# Patient Record
Sex: Female | Born: 1995 | Race: Black or African American | Hispanic: No | Marital: Single | Smoking: Never smoker
Health system: Southern US, Community
[De-identification: ages and names within clinical notes are randomized; demographics above are authoritative.]

## PROBLEM LIST (undated history)

## (undated) ENCOUNTER — Inpatient Hospital Stay (HOSPITAL_COMMUNITY): Payer: Self-pay

## (undated) ENCOUNTER — Ambulatory Visit (HOSPITAL_COMMUNITY): Admission: EM | Discharge status: Absent without leave | Payer: BLUE CROSS/BLUE SHIELD

## (undated) DIAGNOSIS — R519 Headache, unspecified: Secondary | ICD-10-CM

## (undated) DIAGNOSIS — D649 Anemia, unspecified: Secondary | ICD-10-CM

## (undated) DIAGNOSIS — R51 Headache: Secondary | ICD-10-CM

## (undated) HISTORY — PX: INDUCED ABORTION: SHX677

## (undated) HISTORY — PX: WISDOM TOOTH EXTRACTION: SHX21

---
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
## 2017-03-17 ENCOUNTER — Encounter (HOSPITAL_COMMUNITY): Payer: Self-pay | Admitting: *Deleted

## 2017-03-17 ENCOUNTER — Emergency Department (HOSPITAL_COMMUNITY)
Admission: EM | Admit: 2017-03-17 | Discharge: 2017-03-17 | Disposition: A | Discharge status: Home / Self Care | Payer: Medicaid Other | Attending: Emergency Medicine | Admitting: Emergency Medicine

## 2017-03-17 DIAGNOSIS — H9202 Otalgia, left ear: Secondary | ICD-10-CM | POA: Diagnosis present

## 2017-03-17 DIAGNOSIS — N926 Irregular menstruation, unspecified: Secondary | ICD-10-CM

## 2017-03-17 DIAGNOSIS — H9312 Tinnitus, left ear: Secondary | ICD-10-CM | POA: Insufficient documentation

## 2017-03-17 DIAGNOSIS — Z79899 Other long term (current) drug therapy: Secondary | ICD-10-CM | POA: Insufficient documentation

## 2017-03-17 DIAGNOSIS — Z3201 Encounter for pregnancy test, result positive: Secondary | ICD-10-CM

## 2017-03-17 LAB — URINALYSIS, ROUTINE W REFLEX MICROSCOPIC
BACTERIA UA: NONE SEEN
BILIRUBIN URINE: NEGATIVE
Glucose, UA: NEGATIVE mg/dL
HGB URINE DIPSTICK: NEGATIVE
KETONES UR: 20 mg/dL — AB
NITRITE: NEGATIVE
Protein, ur: 100 mg/dL — AB
SPECIFIC GRAVITY, URINE: 1.028 (ref 1.005–1.030)
pH: 6 (ref 5.0–8.0)

## 2017-03-17 LAB — POC URINE PREG, ED: Preg Test, Ur: POSITIVE — AB

## 2017-03-17 MED ORDER — PRENATAL FORTE PO TABS: 1.0000 | ORAL_TABLET | Freq: Once | ORAL | 0 refills | Status: AC

## 2017-03-17 NOTE — ED Notes (Signed)
MD at bedside. 

## 2017-03-17 NOTE — ED Provider Notes (Signed)
[facility]-EMERGENCY DEPT Provider Note   CSN: [id number] Arrival date & time: 03/17/17  1833  By signing my name below, I, Linna Darner, attest that this documentation has been prepared under the direction and in the presence of Sharen Heck, PA-C. Electronically Signed: Linna Darner, Scribe. 03/17/2017. 8:57 PM.  History   Chief Complaint Chief Complaint  Patient presents with  . Otalgia    The history is provided by the patient. No language interpreter was used.     HPI Comments: April Wallace is a 21 y.o. female who presents to the Emergency Department with two concerns.    Patient reports intermittent "deep" left ear pain for about one week associated with tinnitus that presents simultaneously with her ear pain. Pt states neither of her left ear symptoms are present currently. No alleviating factors noted. No recent swimming. No h/o trauma to her left ear/eardrum. Pt denies ear discharge, fevers, facial pain, sore throat, congestion, cough, dizziness, sensation of room spinning, hearing loss, or any other associated symptoms.    Patient reports abdominal pain described as cramping similar to menstrual cramps she has in the past.  Patient notes her menstrual cycle is now four days late. Pt's LMP was 3/1-3/7 and she states her next menstrual cycle was supposed to begin on 3/28. She states she has had one episode of vomiting since onset of her abdominal pain without nausea. Patient has previously been on hormone therapy birth control pills but discontinued them this past February. She had planned to start Depo shots but has not received one yet since discontinuing her oral contraceptives.  Patient notes she has taken two pregnancy tests at home, one was negative and the other was positive.  She is sexually active with one partner and always uses barrier protection reportedly 100% of the time, notes she has not had intercourse in one month. Pt denies vaginal bleeding or spotting,  vaginal discharge, dysuria, urinary frequency, back pain, or any other associated symptoms.  History reviewed. No pertinent past medical history.  There are no active problems to display for this patient.   History reviewed. No pertinent surgical history.  OB History    No data available       Home Medications    Prior to Admission medications   Medication Sig Start Date End Date Taking? Authorizing Provider  benzonatate (TESSALON) 100 MG capsule Take 1 capsule (100 mg total) by mouth every 8 (eight) hours. 10/30/16   Alexandra M Law, PA-C  cetirizine-pseudoephedrine (ZYRTEC-D) 5-120 MG tablet Take 1 tablet by mouth 2 (two) times daily. 10/30/16   Emi Holes, PA-C  guaiFENesin (ROBITUSSIN) 100 MG/5ML liquid Take 5-10 mLs (100-200 mg total) by mouth every 4 (four) hours as needed for congestion. 12/04/15   Fayrene Helper, PA-C  naproxen sodium (ANAPROX DS) 550 MG tablet Take 1 tablet (550 mg total) by mouth 2 (two) times daily with a meal. 05/15/16   Tharon Aquas, PA  promethazine-dextromethorphan (PROMETHAZINE-DM) 6.25-15 MG/5ML syrup Take 5 mLs by mouth 4 (four) times daily as needed for cough. 12/04/15   Fayrene Helper, PA-C    Family History No family history on file.  Social History Social History  Substance Use Topics  . Smoking status: Never Smoker  . Smokeless tobacco: Never Used  . Alcohol use No     Allergies   Patient has no known allergies.   Review of Systems Review of Systems  Constitutional: Negative for fever.  HENT: Positive for ear pain (L) and tinnitus (  L). Negative for congestion, ear discharge, hearing loss and sore throat.   Respiratory: Negative for cough.   Gastrointestinal: Positive for abdominal pain and vomiting. Negative for nausea.  Genitourinary: Negative for dysuria, frequency and vaginal bleeding.  Musculoskeletal: Negative for back pain.  Neurological: Negative for light-headedness.   Physical Exam Updated Vital Signs BP 107/79 (BP  Location: Right Arm)   Pulse 87   Temp 98.8 F (37.1 C) (Oral)   Resp 18   SpO2 100%   Physical Exam  Constitutional: She is oriented to person, place, and time. She appears well-developed and well-nourished. She is cooperative. No distress.  HENT:  Head: Normocephalic and atraumatic.  Right Ear: Tympanic membrane normal.  Left Ear: Tympanic membrane normal.  Nose: Nose normal.  Ears: External ears without lesions, swelling, deformities or tenderness.  No mastoid tenderness. R and L external ear auditory canals clear without edema or erythema. No pain reported with external ear manipulation.  TMs pearly gray with visible cone of light and bony landmarks bilaterally, no bulging or cloudiness.  Eyes: Conjunctivae and EOM are normal. No scleral icterus.  Cardiovascular: Normal rate, regular rhythm, normal heart sounds and intact distal pulses.   No murmur heard. Pulmonary/Chest: Effort normal and breath sounds normal. She has no wheezes.  Abdominal:  No surgical abdominal scars noted.  No pulsating masses.  + Bowel sounds throughout.  Abdomen is soft, non tender without distention, rigidity, guarding or rebound.  No suprapubic tenderness. No CVAT.  Negative Murphy's. Negative McBurney's. Negative Psoas sign.  Non palpable kidneys. No hepatosplenomegaly.   Musculoskeletal: Normal range of motion. She exhibits no deformity.  Neurological: She is alert and oriented to person, place, and time.  Skin: Skin is warm and dry. Capillary refill takes less than 2 seconds.  Psychiatric: She has a normal mood and affect. Her behavior is normal. Judgment and thought content normal.  Nursing note and vitals reviewed.  ED Treatments / Results  Labs (all labs ordered are listed, but only abnormal results are displayed) Labs Reviewed  URINALYSIS, ROUTINE W REFLEX MICROSCOPIC - Abnormal; Notable for the following:       Result Value   Ketones, ur 20 (*)    Protein, ur 100 (*)    Leukocytes, UA  TRACE (*)    Squamous Epithelial / LPF 0-5 (*)    All other components within normal limits  POC URINE PREG, ED - Abnormal; Notable for the following:    Preg Test, Ur POSITIVE (*)    All other components within normal limits    EKG  EKG Interpretation None       Radiology No results found.  Procedures Procedures (including critical care time)  DIAGNOSTIC STUDIES: Oxygen Saturation is 100% on RA, normal by my interpretation.    COORDINATION OF CARE: 9:03 PM Discussed treatment plan with pt at bedside and pt agreed to plan.  Medications Ordered in ED Medications - No data to display   Initial Impression / Assessment and Plan / ED Course  I have reviewed the triage vital signs and the nursing notes.  Pertinent labs & imaging results that were available during my care of the patient were reviewed by me and considered in my medical decision making (see chart for details).    Intermittent tinnitus and ear pain - Patient currently asymptomatic in ED.  Normal ear exam. No muffled hearing or decreased hearing. No vertiginous symptoms. No trauma or recent swimming. No further emergent imaging or lab work indicated.  Doubt  OM, OE, vertigo.  Likely benign in nature. Advised to monitor symptoms. ED return precautions given.   +urine pregnancy test today.  Patient denies vaginal bleeding or spotting.  No urinary symptoms to suggest UTI. Abdomen benign.  No signs or symptoms that would raise suspicion for ectopic pregnancy, UTI, or abnormal first trimester pregnancy.  Patient tells me she suspected she was pregnant.  She is sexually active without condoms or birth control.  Patient wants to terminate pregnancy and requests resources.  No case management in house tonight but advised [facility] [place] [facility] and care[facility]  [facility] or prenatal care.  I recommended she avoids ETOH, tobacco,  illicit drug use, unnecessary medications and begin prenatal vitamins until she has definitive intervention to terminate pregnancy.  She is agreeable.  Patient is teary eyed and seems upset.  [facility]  Strict ED return precautions given.   Final Clinical Impressions(s) / ED Diagnoses   Final diagnoses:  Missed menses  Tinnitus of left ear  Positive urine pregnancy test    New Prescriptions Discharge Medication List as of 03/17/2017 10:33 PM    START taking these medications   Details  Prenatal Multivit-Min-Fe-FA (PRENATAL FORTE) TABS Take 1 tablet by mouth once., Starting Sun 03/17/2017, Print       I personally performed the services described in this documentation, which was scribed in my presence. The recorded information has been reviewed and is accurate.   Liberty Handy, PA-C 03/18/17 0210    Samuel Jester, DO 03/19/17 2010

## 2017-03-17 NOTE — Discharge Instructions (Signed)
Your urine pregnancy test was positive today. You are pregnant. [facility] contact [facility] for further discussion of your pregnancy. I recommend you start taking prenatal vitamins, avoid alcohol, avoid tobacco and avoid any unnecessary medications not safe during pregnancy whether until you're evaluated by a provider. Return to the emergency department if you notice abdominal cramping with vaginal bleeding or spotting.  Monitor the ringing in your ear. Return to the emergency department if this becomes constant, becomes painful or is associated with muffled hearing, dizziness, nausea or vomiting

## 2017-03-17 NOTE — ED Triage Notes (Signed)
Pt reports left ear pain for 2 days. Pt state that she had 1 episode of N/V 3 days ago.

## 2017-03-30 ENCOUNTER — Encounter (HOSPITAL_COMMUNITY): Payer: Self-pay | Admitting: Nurse Practitioner

## 2017-03-30 ENCOUNTER — Emergency Department (HOSPITAL_COMMUNITY): Payer: Medicaid Other

## 2017-03-30 ENCOUNTER — Emergency Department (HOSPITAL_COMMUNITY)
Admission: EM | Admit: 2017-03-30 | Discharge: 2017-03-30 | Disposition: A | Discharge status: Home / Self Care | Payer: Medicaid Other | Attending: Emergency Medicine | Admitting: Emergency Medicine

## 2017-03-30 DIAGNOSIS — Z3A01 Less than 8 weeks gestation of pregnancy: Secondary | ICD-10-CM | POA: Insufficient documentation

## 2017-03-30 DIAGNOSIS — O26891 Other specified pregnancy related conditions, first trimester: Secondary | ICD-10-CM | POA: Diagnosis present

## 2017-03-30 DIAGNOSIS — O039 Complete or unspecified spontaneous abortion without complication: Secondary | ICD-10-CM | POA: Insufficient documentation

## 2017-03-30 DIAGNOSIS — Z332 Encounter for elective termination of pregnancy: Secondary | ICD-10-CM

## 2017-03-30 DIAGNOSIS — R109 Unspecified abdominal pain: Secondary | ICD-10-CM

## 2017-03-30 DIAGNOSIS — O0281 Inappropriate change in quantitative human chorionic gonadotropin (hCG) in early pregnancy: Secondary | ICD-10-CM | POA: Diagnosis not present

## 2017-03-30 LAB — CBC
HEMATOCRIT: 30.8 % — AB (ref 36.0–46.0)
Hemoglobin: 9.8 g/dL — ABNORMAL LOW (ref 12.0–15.0)
MCH: 24.9 pg — AB (ref 26.0–34.0)
MCHC: 31.8 g/dL (ref 30.0–36.0)
MCV: 78.2 fL (ref 78.0–100.0)
PLATELETS: 191 10*3/uL (ref 150–400)
RBC: 3.94 MIL/uL (ref 3.87–5.11)
RDW: 18 % — AB (ref 11.5–15.5)
WBC: 7.4 10*3/uL (ref 4.0–10.5)

## 2017-03-30 LAB — I-STAT BETA HCG BLOOD, ED (MC, WL, AP ONLY): I-stat hCG, quantitative: >2000 m[IU]/mL — ABNORMAL HIGH (ref <5)

## 2017-03-30 LAB — COMPREHENSIVE METABOLIC PANEL
ALT: 19 U/L (ref 14–54)
AST: 32 U/L (ref 15–41)
Albumin: 4.3 g/dL (ref 3.5–5.0)
Alkaline Phosphatase: 63 U/L (ref 38–126)
Anion gap: 9 (ref 5–15)
BILIRUBIN TOTAL: 0.8 mg/dL (ref 0.3–1.2)
BUN: 11 mg/dL (ref 6–20)
CHLORIDE: 104 mmol/L (ref 101–111)
CO2: 20 mmol/L — ABNORMAL LOW (ref 22–32)
CREATININE: 0.72 mg/dL (ref 0.44–1.00)
Calcium: 9.7 mg/dL (ref 8.9–10.3)
Glucose, Bld: 128 mg/dL — ABNORMAL HIGH (ref 65–99)
POTASSIUM: 3.5 mmol/L (ref 3.5–5.1)
Sodium: 133 mmol/L — ABNORMAL LOW (ref 135–145)
TOTAL PROTEIN: 7.2 g/dL (ref 6.5–8.1)

## 2017-03-30 LAB — HCG, QUANTITATIVE, PREGNANCY: hCG, Beta Chain, Quant, S: 42505 m[IU]/mL — ABNORMAL HIGH (ref <5)

## 2017-03-30 MED ORDER — OXYCODONE-ACETAMINOPHEN 5-325 MG PO TABS
1.0000 | ORAL_TABLET | Freq: Four times a day (QID) | ORAL | 0 refills | Status: DC | PRN
Start: 2017-03-30 — End: 2017-09-29

## 2017-03-30 MED ORDER — SODIUM CHLORIDE 0.9 % IV BOLUS (SEPSIS)
1000.0000 mL | Freq: Once | INTRAVENOUS | Status: AC
  Administered 2017-03-30: 1000 mL via INTRAVENOUS

## 2017-03-30 MED ORDER — KETOROLAC TROMETHAMINE 30 MG/ML IJ SOLN
30.0000 mg | Freq: Once | INTRAMUSCULAR | Status: AC
  Administered 2017-03-30: 30 mg via INTRAVENOUS
  Filled 2017-03-30: qty 1

## 2017-03-30 MED ORDER — MORPHINE SULFATE (PF) 4 MG/ML IV SOLN
4.0000 mg | Freq: Once | INTRAVENOUS | Status: AC
  Administered 2017-03-30: 4 mg via INTRAVENOUS
  Filled 2017-03-30: qty 1

## 2017-03-30 MED ORDER — IBUPROFEN 800 MG PO TABS: 800.0000 mg | ORAL_TABLET | Freq: Three times a day (TID) | ORAL | 0 refills | Status: DC | PRN

## 2017-03-30 MED ORDER — ONDANSETRON HCL 4 MG/2ML IJ SOLN
4.0000 mg | Freq: Once | INTRAMUSCULAR | Status: AC
  Administered 2017-03-30: 4 mg via INTRAVENOUS
  Filled 2017-03-30: qty 2

## 2017-03-30 MED ORDER — PROMETHAZINE HCL 25 MG PO TABS: 25.0000 mg | ORAL_TABLET | Freq: Three times a day (TID) | ORAL | 0 refills | Status: DC | PRN

## 2017-03-30 NOTE — ED Triage Notes (Addendum)
Pt presents with c/o pelvic cramping. The pelvic cramping began yesterday. She reports onset of nausea, vomiting, vaginal bleeding today. Her last menstrual cycle was on 02/15/16 and last about 1 day. She has tried ibuprofen for cramping with no relief.

## 2017-03-30 NOTE — ED Notes (Signed)
Patient was given a hot pack, to place on stomach area.

## 2017-03-30 NOTE — ED Notes (Signed)
Patient was given 3 more heat packs.

## 2017-03-30 NOTE — ED Provider Notes (Signed)
[facility] Provider Note   CSN: [id number] Arrival date & time: 03/30/17  1511     History   Chief Complaint Chief Complaint  Patient presents with  . Pelvic Pain    HPI April Wallace No is a 21 y.o. female.  HPI Patient presents to the emergency department with cramping and abdominal discomfort.  The patient states she was given pills to initiated.  Abortion of a pregnancy.  She states that she received that medication yesterday has been having increasing cramping and abdominal discomfort since that time.  She is also having vaginal bleeding.  Patient states that nothing seems to make the condition better or worse.  She states she did not take any medications for her symptoms other than ibuprofen.  Patient states that she did not receive any medications for pain from the clinic that she was given the pills to terminate the pregnancy.  Patient states that this is her first pregnancyThe patient denies chest pain, shortness of breath, headache,blurred vision, neck pain, fever, cough, weakness, numbness, dizziness, anorexia, edema,  nausea, vomiting, diarrhea, rash, back pain, dysuria, hematemesis, bloody stool, near syncope, or syncope. History reviewed. No pertinent past medical history.  There are no active problems to display for this patient.   History reviewed. No pertinent surgical history.  OB History    No data available       Home Medications    Prior to Admission medications   Medication Sig Start Date End Date Taking? Authorizing Provider  benzonatate (TESSALON) 100 MG capsule Take 1 capsule (100 mg total) by mouth every 8 (eight) hours. Patient not taking: Reported on 03/30/2017 10/30/16   Waylan Boga Law, PA-C  cetirizine-pseudoephedrine (ZYRTEC-D) 5-120 MG tablet Take 1 tablet by mouth 2 (two) times daily. Patient not taking: Reported on 03/30/2017 10/30/16   Waylan Boga Law, PA-C  guaiFENesin (ROBITUSSIN) 100 MG/5ML liquid Take 5-10 mLs (100-200 mg  total) by mouth every 4 (four) hours as needed for congestion. Patient not taking: Reported on 03/30/2017 12/04/15   Fayrene Helper, PA-C  naproxen sodium (ANAPROX DS) 550 MG tablet Take 1 tablet (550 mg total) by mouth 2 (two) times daily with a meal. Patient not taking: Reported on 03/30/2017 05/15/16   Tharon Aquas, PA  promethazine-dextromethorphan (PROMETHAZINE-DM) 6.25-15 MG/5ML syrup Take 5 mLs by mouth 4 (four) times daily as needed for cough. Patient not taking: Reported on 03/30/2017 12/04/15   Fayrene Helper, PA-C    Family History History reviewed. No pertinent family history.  Social History Social History  Substance Use Topics  . Smoking status: Never Smoker  . Smokeless tobacco: Never Used  . Alcohol use No     Allergies   Patient has no known allergies.   Review of Systems Review of Systems All other systems negative except as documented in the HPI. All pertinent positives and negatives as reviewed in the HPI.  Physical Exam Updated Vital Signs BP 107/72 (BP Location: Left Arm)   Pulse 96   Temp 98.1 F (36.7 C) (Oral)   LMP 02/14/2017   SpO2 100%   Physical Exam  Constitutional: She is oriented to person, place, and time. She appears well-developed and well-nourished. No distress.  HENT:  Head: Normocephalic and atraumatic.  Mouth/Throat: Oropharynx is clear and moist.  Eyes: Pupils are equal, round, and reactive to light.  Neck: Normal range of motion. Neck supple.  Cardiovascular: Normal rate, regular rhythm and normal heart sounds.  Exam reveals no gallop and no friction rub.  No murmur heard. Pulmonary/Chest: Effort normal and breath sounds normal. No respiratory distress. She has no wheezes.  Abdominal: Soft. Bowel sounds are normal. She exhibits no distension and no mass. There is tenderness. There is no rebound and no guarding.  Neurological: She is alert and oriented to person, place, and time. She exhibits normal muscle tone. Coordination normal.    Skin: Skin is warm and dry. Capillary refill takes less than 2 seconds. No rash noted. No erythema.  Psychiatric: She has a normal mood and affect. Her behavior is normal.  Nursing note and vitals reviewed.    ED Treatments / Results  Labs (all labs ordered are listed, but only abnormal results are displayed) Labs Reviewed  CBC - Abnormal; Notable for the following:       Result Value   Hemoglobin 9.8 (*)    HCT 30.8 (*)    MCH 24.9 (*)    RDW 18.0 (*)    All other components within normal limits  COMPREHENSIVE METABOLIC PANEL - Abnormal; Notable for the following:    Sodium 133 (*)    CO2 20 (*)    Glucose, Bld 128 (*)    All other components within normal limits  HCG, QUANTITATIVE, PREGNANCY - Abnormal; Notable for the following:    hCG, Beta Chain, Quant, S 42,505 (*)    All other components within normal limits  I-STAT BETA HCG BLOOD, ED ([facility], [facility], [facility] ONLY) - Abnormal; Notable for the following:    I-stat hCG, quantitative >2,000.0 (*)    All other components within normal limits    EKG  EKG Interpretation None       Radiology US Ob Comp Less 14 Wks  Result Date: 03/30/2017 CLINICAL DATA:  21 year old pregnant female presenting with vaginal bleeding and pelvic cramping. Quantitative beta HCG 42,505. EDC by LMP: 11/21/2017, projecting to an expected gestational age of [redacted] weeks 2 days. EXAM: OBSTETRIC <14 WK [place] AND TRANSVAGINAL OB US TECHNIQUE: Both transabdominal and transvaginal ultrasound examinations were performed for complete evaluation of the gestation as well as the maternal uterus, adnexal regions, and pelvic cul-de-sac. Transvaginal technique was performed to assess early pregnancy. COMPARISON:  None. FINDINGS: Anteverted uterus measures 9.1 x 5.0 x 5.4 cm in size. No uterine fibroids or other myometrial abnormalities. Thickened (26 mm) and mildly heterogeneous endometrium. No intrauterine gestational sac. No endometrial cavity fluid or focal endometrial mass. Right  ovary measures 2.3 x 1.6 x 1.8 cm. Left ovary measures 3.0 x 1.9 x 2.8 cm and contains a 1.8 cm corpus luteum. No abnormal ovarian or adnexal masses. Nonspecific small volume free fluid in the pelvis. IMPRESSION: Non-localization of the pregnancy on this scan. No intrauterine gestational sac. No abnormal ovarian or adnexal masses. Nonspecific small volume free fluid in the pelvis. Given the quantitative beta HCG level greater than 2000, the sonographic differential diagnosis includes spontaneous abortion or occult ectopic gestation. Recommend close clinical follow-up and serial serum beta HCG monitoring, with repeat obstetric scan as warranted by beta HCG levels and clinical assessment. Electronically Signed   By: Delbert Phenix M.D.   On: 03/30/2017 18:19   US Ob Transvaginal  Result Date: 03/30/2017 CLINICAL DATA:  21 year old pregnant female presenting with vaginal bleeding and pelvic cramping. Quantitative beta HCG 42,505. EDC by LMP: 11/21/2017, projecting to an expected gestational age of [redacted] weeks 2 days. EXAM: OBSTETRIC <14 WK [place] AND TRANSVAGINAL OB US TECHNIQUE: Both transabdominal and transvaginal ultrasound examinations were performed for complete evaluation of the gestation as  well as the maternal uterus, adnexal regions, and pelvic cul-de-sac. Transvaginal technique was performed to assess early pregnancy. COMPARISON:  None. FINDINGS: Anteverted uterus measures 9.1 x 5.0 x 5.4 cm in size. No uterine fibroids or other myometrial abnormalities. Thickened (26 mm) and mildly heterogeneous endometrium. No intrauterine gestational sac. No endometrial cavity fluid or focal endometrial mass. Right ovary measures 2.3 x 1.6 x 1.8 cm. Left ovary measures 3.0 x 1.9 x 2.8 cm and contains a 1.8 cm corpus luteum. No abnormal ovarian or adnexal masses. Nonspecific small volume free fluid in the pelvis. IMPRESSION: Non-localization of the pregnancy on this scan. No intrauterine gestational sac. No abnormal ovarian or  adnexal masses. Nonspecific small volume free fluid in the pelvis. Given the quantitative beta HCG level greater than 2000, the sonographic differential diagnosis includes spontaneous abortion or occult ectopic gestation. Recommend close clinical follow-up and serial serum beta HCG monitoring, with repeat obstetric scan as warranted by beta HCG levels and clinical assessment. Electronically Signed   By: Delbert Phenix M.D.   On: 03/30/2017 18:19    Procedures Procedures (including critical care time)  Medications Ordered in ED Medications  sodium chloride 0.9 % bolus 1,000 mL (1,000 mLs Intravenous New Bag/Given 03/30/17 1714)  morphine 4 MG/ML injection 4 mg (4 mg Intravenous Given 03/30/17 1715)  ondansetron (ZOFRAN) injection 4 mg (4 mg Intravenous Given 03/30/17 1715)     Initial Impression / Assessment and Plan / ED Course  I have reviewed the triage vital signs and the nursing notes.  Pertinent labs & imaging results that were available during my care of the patient were reviewed by me and considered in my medical decision making (see chart for details).     Patient does not have any abnormalities noted on ultrasound.  This would be consistent with an abortion.  Patient is advised to follow-up with her GYN doctor or the [facility].  Patient is advised to return here as needed.  Patient is been stable while here in the emergency department.  She was given IV fluids to help maintain good hydration.   Final Clinical Impressions(s) / ED Diagnoses   Final diagnoses:  None    New Prescriptions New Prescriptions   No medications on file     Charlestine Night, PA-C 03/30/17 1834    Jacalyn Lefevre, MD 03/30/17 2001

## 2017-03-30 NOTE — Discharge Instructions (Signed)
Return here as needed.  Follow-up with your GYN or the clinic provided.  Increase your fluid intake and rest as much as possible

## 2017-06-17 ENCOUNTER — Encounter (HOSPITAL_COMMUNITY): Payer: Self-pay

## 2017-06-17 ENCOUNTER — Emergency Department (HOSPITAL_COMMUNITY)
Admission: EM | Admit: 2017-06-17 | Discharge: 2017-06-17 | Disposition: A | Discharge status: Home / Self Care | Payer: Medicaid Other | Attending: Emergency Medicine | Admitting: Emergency Medicine

## 2017-06-17 DIAGNOSIS — N390 Urinary tract infection, site not specified: Secondary | ICD-10-CM | POA: Insufficient documentation

## 2017-06-17 DIAGNOSIS — R319 Hematuria, unspecified: Secondary | ICD-10-CM | POA: Insufficient documentation

## 2017-06-17 LAB — POC URINE PREG, ED: Preg Test, Ur: NEGATIVE

## 2017-06-17 LAB — URINALYSIS, ROUTINE W REFLEX MICROSCOPIC
BILIRUBIN URINE: NEGATIVE
GLUCOSE, UA: NEGATIVE mg/dL
Ketones, ur: NEGATIVE mg/dL
NITRITE: NEGATIVE
PH: 8.5 — AB (ref 5.0–8.0)
Protein, ur: 100 mg/dL — AB
SPECIFIC GRAVITY, URINE: 1.02 (ref 1.005–1.030)

## 2017-06-17 LAB — URINALYSIS, MICROSCOPIC (REFLEX)

## 2017-06-17 MED ORDER — CEPHALEXIN 250 MG PO CAPS
500.0000 mg | ORAL_CAPSULE | Freq: Once | ORAL | Status: AC
  Administered 2017-06-17: 500 mg via ORAL
  Filled 2017-06-17: qty 2

## 2017-06-17 MED ORDER — PHENAZOPYRIDINE HCL 200 MG PO TABS: 200.0000 mg | ORAL_TABLET | Freq: Three times a day (TID) | ORAL | 0 refills | Status: DC

## 2017-06-17 MED ORDER — CEPHALEXIN 500 MG PO CAPS: 500.0000 mg | ORAL_CAPSULE | Freq: Two times a day (BID) | ORAL | 0 refills | Status: AC

## 2017-06-17 NOTE — ED Triage Notes (Signed)
PT reports burning with urination x 2 days. PT also reports headache and lower back pain that started at the same time. PT denies abnormal vaginal discharge or odor.

## 2017-06-17 NOTE — Discharge Instructions (Signed)
Please take Keflex (antibiotic) for full course as prescribed. Please take Pyridium as well as prescribed with or after meals for 2 days. Caution this medication may turn your urine and secretions orange. Call to schedule a follow up appointment with your primary care doctor within 3-5 days. Return tot he ER if you experience fevers, chills, unexplained weight loss, dizziness, chest pain, shortness of breath, abdominal pain, unable to tolerate food or fluids, blood in urine or stool, flank pain, vaginal discharge or bleeding, worsening symptoms, or any additional concerns.

## 2017-06-17 NOTE — ED Provider Notes (Signed)
[facility] Emergency Department Provider Note  By signing my name below, I, Sonum Patel, attest that this documentation has been prepared under the direction and in the presence of Wille Glaser, NP. Electronically Signed: Sonum Patel, Neurosurgeon. 06/17/17. 4:01 PM.  ED Clinical Impression  Urinary tract infection with hematuria, site unspecified  History   Chief Complaint Dysuria   HPI  Patient is a 21 y.o. female presenting to the ED for dysuria and urinary frequency, onset 2 days ago, concerned she may have a UTI. Denies vaginal discharge or bleeding. LMP 05/26/17, denies OCP use. States she is currently sexually active, +condom use. Denies previous hx of STDs. Denies fevers, chills, unexplained weight loss, dizziness, vision or gait changes, CP, SOB, cough, hemoptysis, abd pain, n/v/d, hematuria, flank pain, extremity numbness/tingling/weakness, or any additional concerns. No hx of kidney stones.   History reviewed. No pertinent past medical history.  History reviewed. No pertinent surgical history.  Current Outpatient Rx  . Order #: [id number] Class: Print  . Order #: [id number] Class: Print  . Order #: [id number] Class: Print  . Order #: [id number] Class: Print  . Order #: [id number] Class: Normal  . Order #: [id number] Class: Print  . Order #: [id number] Class: Print  . Order #: [id number] Class: Print    Allergies Patient has no known allergies.  No family history on file.  Social History Social History  Substance Use Topics  . Smoking status: Never Smoker  . Smokeless tobacco: Never Used  . Alcohol use No    Review of Systems  Constitutional: Negative for fever, chills, or unexplained weight loss. Eyes: Negative for visual changes. ENT: Negative for nasal congestion, ear pain, or sore throat. Cardiovascular: Negative for chest pain or extremity swelling. Respiratory: Negative for shortness of breath or cough. Gastrointestinal: Negative for abdominal pain, nausea, vomiting, or  diarrhea. Genitourinary: Positive for dysuria, frequency. Negative for hematuria, vaginal discharge, or vaginal bleeding. Negative for flank pain.  Musculoskeletal: Negative for extremity pain/swelling. Skin: Negative for rash. Neurological: Negative for headaches, dizziness, focal weakness, or numbness.  Physical Exam   VITAL SIGNS:   ED Triage Vitals  Enc Vitals Group     BP 06/17/17 1258 97/71     Pulse Rate 06/17/17 1258 94     Resp 06/17/17 1258 16     Temp 06/17/17 1258 98.2 F (36.8 C)     Temp Source 06/17/17 1258 Oral     SpO2 06/17/17 1258 100 %     Weight 06/17/17 1259 100 lb (45.4 kg)     Height 06/17/17 1259 5\' 4"  (1.626 m)     Head Circumference --      Peak Flow --      Pain Score 06/17/17 1258 2     Pain Loc --      Pain Edu? --      Excl. in GC? --      Constitutional: Alert and oriented. Well appearing and in no respiratory apparent distress. Eyes: PERRL, EOMI, Conjunctivae normal ENT      Head: Normocephalic and atraumatic.      Mouth/Throat: Mucous membranes are moist. Oropharynx without erythema or exudate. No oral lesions noted. Normal voice, handling secretions normally.      Neck: Supple, no nuchal signs, full active ROM of neck.  Cardiovascular: Normal S1 S2, regular rhythm, normal rate. Normal and symmetric distal pulses are present in all extremities. Respiratory: Breath sounds clear and equal bilaterally. No wheezes, rales, or rhonchi. Normal respiratory effort.  Gastrointestinal: Abdomen soft and nontender. No rebound  or guarding. There is no CVA tenderness. Genitourinary: Deferred.  Back: No midline tenderness, no stepoff.  Musculoskeletal: Nontender with normal range of motion in all extremities. Neurologic: Speech clear. Alert and appropriate, no gross focal neurologic deficits are appreciated. Gait steady with ambulation. Equal strength in all four extremities. Extremities neurovascularly intact.  Skin: Skin is warm, dry, and intact. No rash  noted. Psychiatric: Mood and affect are normal. Speech and behavior are normal.  Labs   Labs Reviewed  URINALYSIS, ROUTINE W REFLEX MICROSCOPIC - Abnormal; Notable for the following:       Result Value   APPearance CLOUDY (*)    pH 8.5 (*)    Hgb urine dipstick MODERATE (*)    Protein, ur 100 (*)    Leukocytes, UA LARGE (*)    All other components within normal limits  URINALYSIS, MICROSCOPIC (REFLEX) - Abnormal; Notable for the following:    Bacteria, UA MANY (*)    Squamous Epithelial / LPF 6-30 (*)    All other components within normal limits  POC URINE PREG, ED     ED Course, Assessment and Plan   Pt is a 21 y/o F, afebrile, who presents to ED for dysuria and urinary frequency, concern for UTI. No CVA tenderness or flank pain. Deferred GU exam at this time. Doubt pyelonephritis or nephrolithiasis. Abdomen soft, nontender, doubt appy or torsion at this time. Will plan to treat with AB and follow up with PCP, discussed strict return precautions.   4:44 PM Discussed results, discharge instructions, rx and safety, return precautions, and follow up. Pt verbalizes understanding using verbal teachback and agrees with plan, denies any additional concerns.   Previous chart, nursing notes, and vital signs reviewed.    Pertinent labs & imaging results that were available during my care of the patient were reviewed by me and considered in my medical decision making (see chart for details).   I personally performed the services described in this documentation, which was scribed in my presence. The recorded information has been reviewed and is accurate.    Wojeck, CanoncitoRobyn K, NP 06/19/17 [id number]    Linwood DibblesKnapp, Jon, MD 06/19/17 [phone]

## 2017-06-25 ENCOUNTER — Encounter (HOSPITAL_COMMUNITY): Payer: Self-pay

## 2017-06-25 ENCOUNTER — Emergency Department (HOSPITAL_COMMUNITY)
Admission: EM | Admit: 2017-06-25 | Discharge: 2017-06-25 | Disposition: A | Discharge status: Home / Self Care | Payer: Medicaid Other | Attending: Emergency Medicine | Admitting: Emergency Medicine

## 2017-06-25 DIAGNOSIS — Z5321 Procedure and treatment not carried out due to patient leaving prior to being seen by health care provider: Secondary | ICD-10-CM | POA: Insufficient documentation

## 2017-06-25 DIAGNOSIS — R042 Hemoptysis: Secondary | ICD-10-CM | POA: Insufficient documentation

## 2017-06-25 NOTE — ED Triage Notes (Signed)
Patient states she had an abortion in April and has been having intermittent hemoptysis.since having the abortion. Patient also c/o perineal rash. Patient denies any vaginal discharge.

## 2017-06-25 NOTE — ED Notes (Signed)
Called for Pt in lobby x2 no response.

## 2017-06-25 NOTE — ED Notes (Signed)
Called for Pt in lobby to be roomed no response.

## 2017-06-25 NOTE — ED Notes (Signed)
Called for Pt in lobby x3, no response.

## 2017-09-14 ENCOUNTER — Emergency Department (HOSPITAL_COMMUNITY)
Admission: EM | Admit: 2017-09-14 | Discharge: 2017-09-14 | Disposition: A | Discharge status: Home / Self Care | Payer: Self-pay | Attending: Emergency Medicine | Admitting: Emergency Medicine

## 2017-09-14 ENCOUNTER — Encounter (HOSPITAL_COMMUNITY): Payer: Self-pay | Admitting: *Deleted

## 2017-09-14 DIAGNOSIS — Z79899 Other long term (current) drug therapy: Secondary | ICD-10-CM | POA: Insufficient documentation

## 2017-09-14 DIAGNOSIS — N3000 Acute cystitis without hematuria: Secondary | ICD-10-CM | POA: Insufficient documentation

## 2017-09-14 DIAGNOSIS — N3090 Cystitis, unspecified without hematuria: Secondary | ICD-10-CM

## 2017-09-14 LAB — URINALYSIS, ROUTINE W REFLEX MICROSCOPIC
Bacteria, UA: NONE SEEN
Bilirubin Urine: NEGATIVE
Glucose, UA: NEGATIVE mg/dL
Ketones, ur: NEGATIVE mg/dL
Nitrite: NEGATIVE
Protein, ur: 30 mg/dL — AB
Specific Gravity, Urine: 1.017 (ref 1.005–1.030)
pH: 6 (ref 5.0–8.0)

## 2017-09-14 LAB — POC URINE PREG, ED: Preg Test, Ur: NEGATIVE

## 2017-09-14 MED ORDER — CEPHALEXIN 500 MG PO CAPS: 1000.0000 mg | ORAL_CAPSULE | Freq: Two times a day (BID) | ORAL | 0 refills | Status: DC

## 2017-09-14 MED ORDER — PROMETHAZINE HCL 25 MG PO TABS: 25.0000 mg | ORAL_TABLET | Freq: Three times a day (TID) | ORAL | 0 refills | Status: DC | PRN

## 2017-09-14 NOTE — ED Triage Notes (Signed)
Patient with polyuria, dysuria, and hematuria.

## 2017-09-14 NOTE — Discharge Instructions (Signed)
Return here as needed.  Follow up with a primary care doctor °

## 2017-09-14 NOTE — ED Provider Notes (Signed)
[facility]-EMERGENCY DEPT Provider Note   CSN: [id number] Arrival date & time: 09/14/17  1639     History   Chief Complaint Chief Complaint  Patient presents with  . Polyuria  . Dysuria  . Hematuria    HPI April Wallace is a 21 y.o. female.  HPI Patient presents to the emergency department with urinary frequency and pain with urination.  Also she noted blood in her urine  The patient states that she has had urinary tract infections in the past and this feels similar. patient denies abdominal pain, nausea, vomiting, fever, weakness, dizziness, back pain, or syncope.  The patient states she did not take any medications prior to arrival History reviewed. No pertinent past medical history.  There are no active problems to display for this patient.   History reviewed. No pertinent surgical history.  OB History    No data available       Home Medications    Prior to Admission medications   Medication Sig Start Date End Date Taking? Authorizing Provider  benzonatate (TESSALON) 100 MG capsule Take 1 capsule (100 mg total) by mouth every 8 (eight) hours. Patient not taking: Reported on 03/30/2017 10/30/16   Emi Holes, PA-C  cetirizine-pseudoephedrine (ZYRTEC-D) 5-120 MG tablet Take 1 tablet by mouth 2 (two) times daily. Patient not taking: Reported on 03/30/2017 10/30/16   Emi Holes, PA-C  guaiFENesin (ROBITUSSIN) 100 MG/5ML liquid Take 5-10 mLs (100-200 mg total) by mouth every 4 (four) hours as needed for congestion. Patient not taking: Reported on 03/30/2017 12/04/15   Fayrene Helper, PA-C  ibuprofen (ADVIL,MOTRIN) 800 MG tablet Take 1 tablet (800 mg total) by mouth every 8 (eight) hours as needed. 03/30/17   Cerina Leary, Cristal Deer, PA-C  naproxen sodium (ANAPROX DS) 550 MG tablet Take 1 tablet (550 mg total) by mouth 2 (two) times daily with a meal. Patient not taking: Reported on 03/30/2017 05/15/16   Tharon Aquas, PA  oxyCODONE-acetaminophen (PERCOCET/ROXICET)  5-325 MG tablet Take 1 tablet by mouth every 6 (six) hours as needed for severe pain. 03/30/17   Auriella Wieand, Cristal Deer, PA-C  phenazopyridine (PYRIDIUM) 200 MG tablet Take 1 tablet (200 mg total) by mouth 3 (three) times daily. With or after meals 06/17/17   Wojeck, Hinton Dyer, NP  promethazine (PHENERGAN) 25 MG tablet Take 1 tablet (25 mg total) by mouth every 8 (eight) hours as needed for nausea or vomiting. 03/30/17   Khyleigh Furney, Cristal Deer, PA-C  promethazine-dextromethorphan (PROMETHAZINE-DM) 6.25-15 MG/5ML syrup Take 5 mLs by mouth 4 (four) times daily as needed for cough. Patient not taking: Reported on 03/30/2017 12/04/15   Fayrene Helper, PA-C    Family History History reviewed. No pertinent family history.  Social History Social History  Substance Use Topics  . Smoking status: Never Smoker  . Smokeless tobacco: Never Used  . Alcohol use No     Allergies   Patient has no known allergies.   Review of Systems Review of Systems All other systems negative except as documented in the HPI. All pertinent positives and negatives as reviewed in the HPI.  Physical Exam Updated Vital Signs BP 95/75 (BP Location: Left Arm)   Pulse 86   Temp 98.7 F (37.1 C) (Oral)   Resp 14   LMP 07/24/2017   SpO2 99%   Physical Exam  Constitutional: She is oriented to person, place, and time. She appears well-developed and well-nourished. No distress.  HENT:  Head: Normocephalic and atraumatic.  Eyes: Pupils are equal, round, and reactive  to light.  Cardiovascular: Normal rate, regular rhythm and normal heart sounds.  Exam reveals no gallop and no friction rub.   No murmur heard. Pulmonary/Chest: Effort normal and breath sounds normal.  Abdominal: Soft. Bowel sounds are normal. She exhibits no distension. There is no tenderness. There is no guarding.  Neurological: She is alert and oriented to person, place, and time.  Skin: Skin is warm and dry.  Psychiatric: She has a normal mood and affect.  Nursing  note and vitals reviewed.    ED Treatments / Results  Labs (all labs ordered are listed, but only abnormal results are displayed) Labs Reviewed  URINALYSIS, ROUTINE W REFLEX MICROSCOPIC - Abnormal; Notable for the following:       Result Value   APPearance HAZY (*)    Hgb urine dipstick MODERATE (*)    Protein, ur 30 (*)    Leukocytes, UA LARGE (*)    Squamous Epithelial / LPF 0-5 (*)    All other components within normal limits  POC URINE PREG, ED    EKG  EKG Interpretation None       Radiology No results found.  Procedures Procedures (including critical care time)  Medications Ordered in ED Medications - No data to display   Initial Impression / Assessment and Plan / ED Course  I have reviewed the triage vital signs and the nursing notes.  Pertinent labs & imaging results that were available during my care of the patient were reviewed by me and considered in my medical decision making (see chart for details).     Patient be treated for urinary tract infection.  Told to return here as needed.  Patient agrees the plan and all questions were answered.  At this point, uncomplicated infection.  We will send off a culture for further delineation of appropriate antibiotics.  I advised the patient to return for worsening in her condition.  She agrees to this plan and all questions were answered  Final Clinical Impressions(s) / ED Diagnoses   Final diagnoses:  None    New Prescriptions New Prescriptions   No medications on file     Charlestine Night, PA-C 09/16/17 0035    Nira Conn, MD 09/16/17 [phone]

## 2017-09-29 ENCOUNTER — Inpatient Hospital Stay (HOSPITAL_COMMUNITY)
Admission: AD | Admit: 2017-09-29 | Discharge: 2017-09-29 | Disposition: A | Discharge status: Home / Self Care | Payer: Medicaid Other | Source: Ambulatory Visit | Attending: Obstetrics & Gynecology | Admitting: Obstetrics & Gynecology

## 2017-09-29 ENCOUNTER — Inpatient Hospital Stay (HOSPITAL_COMMUNITY): Payer: Medicaid Other

## 2017-09-29 ENCOUNTER — Encounter (HOSPITAL_COMMUNITY): Payer: Self-pay | Admitting: Certified Nurse Midwife

## 2017-09-29 DIAGNOSIS — O26891 Other specified pregnancy related conditions, first trimester: Secondary | ICD-10-CM | POA: Diagnosis not present

## 2017-09-29 DIAGNOSIS — N939 Abnormal uterine and vaginal bleeding, unspecified: Secondary | ICD-10-CM | POA: Diagnosis present

## 2017-09-29 DIAGNOSIS — O9989 Other specified diseases and conditions complicating pregnancy, childbirth and the puerperium: Secondary | ICD-10-CM

## 2017-09-29 DIAGNOSIS — Z3A01 Less than 8 weeks gestation of pregnancy: Secondary | ICD-10-CM | POA: Insufficient documentation

## 2017-09-29 DIAGNOSIS — R109 Unspecified abdominal pain: Secondary | ICD-10-CM

## 2017-09-29 DIAGNOSIS — O209 Hemorrhage in early pregnancy, unspecified: Secondary | ICD-10-CM | POA: Diagnosis not present

## 2017-09-29 DIAGNOSIS — O3680X0 Pregnancy with inconclusive fetal viability, not applicable or unspecified: Secondary | ICD-10-CM

## 2017-09-29 DIAGNOSIS — R102 Pelvic and perineal pain: Secondary | ICD-10-CM | POA: Diagnosis not present

## 2017-09-29 LAB — CBC
HEMATOCRIT: 26.4 % — AB (ref 36.0–46.0)
Hemoglobin: 8 g/dL — ABNORMAL LOW (ref 12.0–15.0)
MCH: 21.4 pg — ABNORMAL LOW (ref 26.0–34.0)
MCHC: 30.3 g/dL (ref 30.0–36.0)
MCV: 70.8 fL — ABNORMAL LOW (ref 78.0–100.0)
Platelets: 215 10*3/uL (ref 150–400)
RBC: 3.73 MIL/uL — ABNORMAL LOW (ref 3.87–5.11)
RDW: 19.2 % — AB (ref 11.5–15.5)
WBC: 5.9 10*3/uL (ref 4.0–10.5)

## 2017-09-29 LAB — URINALYSIS, ROUTINE W REFLEX MICROSCOPIC
BACTERIA UA: NONE SEEN
BILIRUBIN URINE: NEGATIVE
Glucose, UA: NEGATIVE mg/dL
Hgb urine dipstick: NEGATIVE
KETONES UR: 5 mg/dL — AB
Nitrite: NEGATIVE
PH: 6 (ref 5.0–8.0)
PROTEIN: NEGATIVE mg/dL
Specific Gravity, Urine: 1.024 (ref 1.005–1.030)

## 2017-09-29 LAB — WET PREP, GENITAL
Sperm: NONE SEEN
Trich, Wet Prep: NONE SEEN
Yeast Wet Prep HPF POC: NONE SEEN

## 2017-09-29 LAB — ABO/RH: ABO/RH(D): O POS

## 2017-09-29 LAB — HCG, QUANTITATIVE, PREGNANCY: HCG, BETA CHAIN, QUANT, S: 5006 m[IU]/mL — AB (ref <5)

## 2017-09-29 LAB — POCT PREGNANCY, URINE: PREG TEST UR: POSITIVE — AB

## 2017-09-29 NOTE — MAU Note (Signed)
Patient presents to MAU with c/o abdominal pain and vaginal bleeding. States she took a HPT on Tuesday where she discovered she was pregnant. Since last Wednesday she has noticed some blood when she uses the bathroom and wipes sometimes- does not remember color of blood, currently not bleeding. Also complains of sharp abdominal pain since the 6th of October before she found out she was pregnant. Also is having some green discharge.

## 2017-09-29 NOTE — Discharge Instructions (Signed)
Ectopic Pregnancy °An ectopic pregnancy happens when a fertilized egg grows outside the uterus. A pregnancy cannot live outside of the uterus. This problem often happens in the fallopian tube. It is often caused by damage to the fallopian tube. °If this problem is found early, you may be treated with medicine. If your tube tears or bursts open (ruptures), you will bleed inside. This is an emergency. You will need surgery. Get help right away. °What are the signs or symptoms? °You may have normal pregnancy symptoms at first. These include: °· Missing your period. °· Feeling sick to your stomach (nauseous). °· Being tired. °· Having tender breasts. ° °Then, you may start to have symptoms that are not normal. These include: °· Pain with sex (intercourse). °· Bleeding from the vagina. This includes light bleeding (spotting). °· Belly (abdomen) or lower belly cramping or pain. This may be felt on one side. °· A fast heartbeat (pulse). °· Passing out (fainting) after going poop (bowel movement). ° °If your tube tears, you may have symptoms such as: °· Really bad pain in the belly or lower belly. This happens suddenly. °· Dizziness. °· Passing out. °· Shoulder pain. ° °Get help right away if: °You have any of these symptoms. This is an emergency. °This information is not intended to replace advice given to you by your health care provider. Make sure you discuss any questions you have with your health care provider. °Document Released: 03/01/2009 Document Revised: 05/10/2016 Document Reviewed: 07/15/2013 °Elsevier Interactive Patient Education © 2017 [employer]. ° °

## 2017-09-30 ENCOUNTER — Other Ambulatory Visit: Payer: Self-pay | Admitting: Student

## 2017-09-30 ENCOUNTER — Telehealth: Payer: Self-pay | Admitting: Student

## 2017-09-30 LAB — GC/CHLAMYDIA PROBE AMP (~~LOC~~) NOT AT ARMC
Chlamydia: NEGATIVE
Neisseria Gonorrhea: NEGATIVE

## 2017-09-30 NOTE — Telephone Encounter (Signed)
Left message for patient to come for bhcg on Wednesday at 8 am and wait for results.

## 2017-09-30 NOTE — MAU Provider Note (Signed)
Patient April Wallace 21 y.o. G2P0010 At [redacted]w[redacted]d here with complaints of occasional abdominal pain and spotting off and on when she wipes.  She took a pregnancy test a week ago and it was positive.  She denies abnormal discharge, dysuria, or other gyn complaint. She has not started prenatal care yet.    History     CSN: [id number]  Arrival date and time: 09/29/17 4098   First Provider Initiated Contact with Patient 09/29/17 2132      Chief Complaint  Patient presents with  . Possible Pregnancy  . Vaginal Bleeding  . Abdominal Pain   Abdominal Pain  This is a new problem. The current episode started in the past 7 days. The problem occurs 2 to 4 times per day. The problem has been unchanged. The pain is located in the LLQ, RLQ and suprapubic region. The pain is at a severity of 9/10. The quality of the pain is sharp and cramping. The abdominal pain does not radiate. Pertinent negatives include no diarrhea, dysuria or vomiting. Nothing aggravates the pain. The pain is relieved by nothing. She has tried nothing for the symptoms.  Vaginal Bleeding  The patient's primary symptoms include vaginal bleeding. The patient's pertinent negatives include no genital odor or missed menses. The current episode started 1 to 4 weeks ago. The problem occurs rarely. Associated symptoms include abdominal pain. Pertinent negatives include no diarrhea, dysuria or vomiting. The vaginal discharge was bloody. The vaginal bleeding is spotting. She has not been passing clots. She has not been passing tissue. Nothing aggravates the symptoms. She has tried nothing for the symptoms.    OB History    Gravida Para Term Preterm AB Living   2       1     SAB TAB Ectopic Multiple Live Births                  History reviewed. No pertinent past medical history.  History reviewed. No pertinent surgical history.  History reviewed. No pertinent family history.  Social History  Substance Use Topics  . Smoking  status: Never Smoker  . Smokeless tobacco: Never Used  . Alcohol use No    Allergies: No Known Allergies  No prescriptions prior to admission.    Review of Systems  HENT: Negative.   Respiratory: Negative.   Gastrointestinal: Positive for abdominal pain. Negative for diarrhea and vomiting.  Genitourinary: Positive for vaginal bleeding. Negative for dysuria and missed menses.  Musculoskeletal: Negative.   Neurological: Negative.    Physical Exam   Blood pressure 95/66, pulse 90, temperature 98.3 F (36.8 C), temperature source Oral, resp. rate 16, height 5' 4.5" (1.638 m), last menstrual period 08/24/2017.  Physical Exam  Constitutional: She is oriented to person, place, and time. She appears well-developed and well-nourished.  HENT:  Head: Normocephalic.  Neck: Normal range of motion.  Cardiovascular: Normal rate.   Respiratory: Effort normal.  GI: Soft.  Genitourinary: No vaginal discharge found.  Genitourinary Comments: NEFG, no lesions on vaginal walls. No blood in the vagina, white mucousy discharge in the vaginal vault. No CMT, suprapubic or abdominal tenderness. Cervix is closed, long and thick.   Musculoskeletal: Normal range of motion.  Neurological: She is alert and oriented to person, place, and time.  Skin: Skin is warm and dry.  Psychiatric: She has a normal mood and affect.    MAU Course  Procedures  MDM CBC, ABO, beta Beta is 5,000 US Ob Comp Less 14 Wks  Result Date: 09/29/2017 CLINICAL DATA:  Pelvic pain since 09/21/2017. Estimated gestational age by LMP is 5 weeks 1 day. Quantitative beta HCG is pending. EXAM: OBSTETRIC <14 WK [place] AND TRANSVAGINAL OB US TECHNIQUE: Both transabdominal and transvaginal ultrasound examinations were performed for complete evaluation of the gestation as well as the maternal uterus, adnexal regions, and pelvic cul-de-sac. Transvaginal technique was performed to assess early pregnancy. COMPARISON:  None. FINDINGS: Intrauterine  gestational sac: A single intrauterine gestational sac is identified. Yolk sac:  Not Visualized. Embryo:  Not Visualized. Cardiac Activity: Not Visualized. MSD: 5.9  mm   5 w   2  d Subchorionic hemorrhage:  None visualized. Maternal uterus/adnexae: The uterus is anteverted. The endometrium is somewhat thickened and heterogeneous with possible increased vascularity of the anterior fundal level. This appearance is nonspecific but could indicate gestational trophoblastic disease, possibly representing a partial molar pregnancy. No abnormal endometrial fluid. Both ovaries are visualized and are unremarkable in appearance. No abnormal adnexal masses. Probable corpus luteal cyst on the right. Small amount of free fluid in the pelvis. IMPRESSION: Probable early intrauterine gestational sac, but no yolk sac, fetal pole, or cardiac activity yet visualized. Nonspecific heterogeneous appearance of the endometrium. Recommend follow-up quantitative B-HCG levels and follow-up US in 14 days to assess viability. This recommendation follows SRU consensus guidelines: Diagnostic Criteria for Nonviable Pregnancy Early in the First Trimester. Malva Limes Med 2013; 161:0960-45. Electronically Signed   By: Burman Nieves M.D.   On: 09/29/2017 22:13   US Ob Transvaginal  Result Date: 09/29/2017 CLINICAL DATA:  Pelvic pain since 09/21/2017. Estimated gestational age by LMP is 5 weeks 1 day. Quantitative beta HCG is pending. EXAM: OBSTETRIC <14 WK [place] AND TRANSVAGINAL OB US TECHNIQUE: Both transabdominal and transvaginal ultrasound examinations were performed for complete evaluation of the gestation as well as the maternal uterus, adnexal regions, and pelvic cul-de-sac. Transvaginal technique was performed to assess early pregnancy. COMPARISON:  None. FINDINGS: Intrauterine gestational sac: A single intrauterine gestational sac is identified. Yolk sac:  Not Visualized. Embryo:  Not Visualized. Cardiac Activity: Not Visualized. MSD: 5.9   mm   5 w   2  d Subchorionic hemorrhage:  None visualized. Maternal uterus/adnexae: The uterus is anteverted. The endometrium is somewhat thickened and heterogeneous with possible increased vascularity of the anterior fundal level. This appearance is nonspecific but could indicate gestational trophoblastic disease, possibly representing a partial molar pregnancy. No abnormal endometrial fluid. Both ovaries are visualized and are unremarkable in appearance. No abnormal adnexal masses. Probable corpus luteal cyst on the right. Small amount of free fluid in the pelvis. IMPRESSION: Probable early intrauterine gestational sac, but no yolk sac, fetal pole, or cardiac activity yet visualized. Nonspecific heterogeneous appearance of the endometrium. Recommend follow-up quantitative B-HCG levels and follow-up US in 14 days to assess viability. This recommendation follows SRU consensus guidelines: Diagnostic Criteria for Nonviable Pregnancy Early in the First Trimester. Malva Limes Med 2013; 409:8119-14. Electronically Signed   By: Burman Nieves M.D.   On: 09/29/2017 22:13    Patient was counseled on the fact that her [place] did not show an IUP and that she will need further follow-up to confirm pregnancy location and viability.   Assessment and Plan   1. Pregnancy of unknown anatomic location   2. Pelvic pain    2. Patient to return in 48 hours to the clinic for bhcg pm . At that point, clinic provider can determine whether to repeat hcg or schedule [place] for  viability in two weeks. Patient does not need to wait for her results but agrees to be available by phone on Wednesday afternoon, Octo 17.   3. Strict ectopic precautions given.    Charlesetta Garibaldi Avant Printy CNM 09/30/2017, 2:24 AM

## 2017-10-02 ENCOUNTER — Telehealth: Payer: Self-pay | Admitting: Advanced Practice Midwife

## 2017-10-02 ENCOUNTER — Ambulatory Visit: Payer: Medicaid Other

## 2017-10-02 ENCOUNTER — Encounter: Payer: Self-pay | Admitting: General Practice

## 2017-10-02 NOTE — Progress Notes (Unsigned)
Patient no showed for stat bhcg today. See [facility] from [facility]

## 2017-10-02 NOTE — Telephone Encounter (Signed)
Pt called to report [facility]. She was seen in MAU on 10/14 with abdominal pain.  She reports increased pain today. She has to go to work and wants to go to the office tomorrow morning instead of this afternoon. Due to increase in pain, recommend she come to MAU now for repeat lab and to further evaluate pain.  Pt states understanding.

## 2017-10-05 ENCOUNTER — Inpatient Hospital Stay (HOSPITAL_COMMUNITY)
Admission: AD | Admit: 2017-10-05 | Discharge: 2017-10-05 | Disposition: A | Discharge status: Home / Self Care | Payer: Medicaid Other | Source: Ambulatory Visit | Attending: Obstetrics and Gynecology | Admitting: Obstetrics and Gynecology

## 2017-10-05 ENCOUNTER — Encounter (HOSPITAL_COMMUNITY): Payer: Self-pay

## 2017-10-05 ENCOUNTER — Inpatient Hospital Stay (HOSPITAL_COMMUNITY): Payer: Medicaid Other

## 2017-10-05 DIAGNOSIS — Z3A01 Less than 8 weeks gestation of pregnancy: Secondary | ICD-10-CM | POA: Diagnosis not present

## 2017-10-05 DIAGNOSIS — R109 Unspecified abdominal pain: Secondary | ICD-10-CM | POA: Insufficient documentation

## 2017-10-05 DIAGNOSIS — R102 Pelvic and perineal pain: Secondary | ICD-10-CM

## 2017-10-05 DIAGNOSIS — O26899 Other specified pregnancy related conditions, unspecified trimester: Secondary | ICD-10-CM

## 2017-10-05 DIAGNOSIS — O26891 Other specified pregnancy related conditions, first trimester: Secondary | ICD-10-CM | POA: Diagnosis not present

## 2017-10-05 LAB — URINALYSIS, ROUTINE W REFLEX MICROSCOPIC
BILIRUBIN URINE: NEGATIVE
Glucose, UA: NEGATIVE mg/dL
HGB URINE DIPSTICK: NEGATIVE
Ketones, ur: NEGATIVE mg/dL
NITRITE: NEGATIVE
PROTEIN: NEGATIVE mg/dL
Specific Gravity, Urine: 1.024 (ref 1.005–1.030)
pH: 7 (ref 5.0–8.0)

## 2017-10-05 LAB — CBC
HCT: 27 % — ABNORMAL LOW (ref 36.0–46.0)
Hemoglobin: 8.5 g/dL — ABNORMAL LOW (ref 12.0–15.0)
MCH: 22.1 pg — ABNORMAL LOW (ref 26.0–34.0)
MCHC: 31.5 g/dL (ref 30.0–36.0)
MCV: 70.3 fL — ABNORMAL LOW (ref 78.0–100.0)
PLATELETS: 211 10*3/uL (ref 150–400)
RBC: 3.84 MIL/uL — ABNORMAL LOW (ref 3.87–5.11)
RDW: 18.9 % — AB (ref 11.5–15.5)
WBC: 5.6 10*3/uL (ref 4.0–10.5)

## 2017-10-05 LAB — HCG, QUANTITATIVE, PREGNANCY: HCG, BETA CHAIN, QUANT, S: 28851 m[IU]/mL — AB (ref <5)

## 2017-10-05 MED ORDER — PRENATAL VITAMINS 0.8 MG PO TABS: 1.0000 | ORAL_TABLET | Freq: Every day | ORAL | 12 refills | Status: DC

## 2017-10-05 NOTE — MAU Note (Signed)
Pt reports abdominal cramping w/o VB since 10/6.  Pt also reports coughing up blood.

## 2017-10-05 NOTE — MAU Provider Note (Signed)
History   Chief Complaint:  Abdominal Cramping   April Wallace is  21 y.o. G2P0010 Patient's last menstrual period was 08/24/2017 (exact date).. Patient is here for complaint of lower abdominal cramping.   She did not show for her followup appts in clinic.  Does not have an explanation as to why not.   She is [redacted]w[redacted]d weeks gestation  by LMP, early ultrasound.    Since her last visit, the patient is without new complaint.   The patient reports bleeding as  none now.    General ROS:  positive  Her previous Quantitative HCG values are:     Physical Exam   Last menstrual period 08/24/2017.  Focused Gynecological Exam: normal external genitalia, vulva, vagina, cervix, uterus and adnexa  Labs: Results for orders placed or performed during the hospital encounter of 10/05/17 (from the past 24 hour(s))  hCG, quantitative, pregnancy     Status: Abnormal   Collection Time: 10/05/17  4:03 PM  Result Value Ref Range   hCG, Beta Chain, Quant, S 28,851 (H) <5 mIU/mL  CBC     Status: Abnormal   Collection Time: 10/05/17  4:03 PM  Result Value Ref Range   WBC 5.6 4.0 - 10.5 K/uL   RBC 3.84 (L) 3.87 - 5.11 MIL/uL   Hemoglobin 8.5 (L) 12.0 - 15.0 g/dL   HCT 16.1 (L) 09.6 - 04.5 %   MCV 70.3 (L) 78.0 - 100.0 fL   MCH 22.1 (L) 26.0 - 34.0 pg   MCHC 31.5 30.0 - 36.0 g/dL   RDW 40.9 (H) 81.1 - 91.4 %   Platelets 211 150 - 400 K/uL     Ultrasound Studies:   US Ob Comp Less 14 Wks  Result Date: 09/29/2017 CLINICAL DATA:  Pelvic pain since 09/21/2017. Estimated gestational age by LMP is 5 weeks 1 day. Quantitative beta HCG is pending. EXAM: OBSTETRIC <14 WK [place] AND TRANSVAGINAL OB US TECHNIQUE: Both transabdominal and transvaginal ultrasound examinations were performed for complete evaluation of the gestation as well as the maternal uterus, adnexal regions, and pelvic cul-de-sac. Transvaginal technique was performed to assess early pregnancy. COMPARISON:  None. FINDINGS: Intrauterine  gestational sac: A single intrauterine gestational sac is identified. Yolk sac:  Not Visualized. Embryo:  Not Visualized. Cardiac Activity: Not Visualized. MSD: 5.9  mm   5 w   2  d Subchorionic hemorrhage:  None visualized. Maternal uterus/adnexae: The uterus is anteverted. The endometrium is somewhat thickened and heterogeneous with possible increased vascularity of the anterior fundal level. This appearance is nonspecific but could indicate gestational trophoblastic disease, possibly representing a partial molar pregnancy. No abnormal endometrial fluid. Both ovaries are visualized and are unremarkable in appearance. No abnormal adnexal masses. Probable corpus luteal cyst on the right. Small amount of free fluid in the pelvis. IMPRESSION: Probable early intrauterine gestational sac, but no yolk sac, fetal pole, or cardiac activity yet visualized. Nonspecific heterogeneous appearance of the endometrium. Recommend follow-up quantitative B-HCG levels and follow-up US in 14 days to assess viability. This recommendation follows SRU consensus guidelines: Diagnostic Criteria for Nonviable Pregnancy Early in the First Trimester. Malva Limes Med 2013; 782:9562-13. Electronically Signed   By: Burman Nieves M.D.   On: 09/29/2017 22:13   US Ob Transvaginal  Result Date: 10/05/2017 CLINICAL DATA:  Pelvic pain and cramping. First trimester pregnancy with inconclusive fetal viability. EXAM: TRANSVAGINAL OB ULTRASOUND TECHNIQUE: Transvaginal ultrasound was performed for complete evaluation of the gestation as well as the maternal uterus, adnexal  regions, and pelvic cul-de-sac. COMPARISON:  09/29/2017 FINDINGS: Intrauterine gestational sac: Single Yolk sac:  Visualized. Embryo:  Visualized. Cardiac Activity: Visualized. Heart Rate: 109 bpm CRL:   2  mm   5 w 5 d                  US EDC: 06/02/2018 Subchorionic hemorrhage:  None visualized. Maternal uterus/adnexae: Right ovarian corpus luteum noted. Normal appearance of left  ovary. No adnexal mass or hemoperitoneum identified. IMPRESSION: Single living IUP measuring 5 weeks 5 days, with US EDC of 06/02/2018. No significant maternal uterine or adnexal abnormality identified. Electronically Signed   By: Myles RosenthalJohn  Stahl M.D.   On: 10/05/2017 17:25   [place] Ob Transvaginal  Result Date: 09/29/2017 CLINICAL DATA:  Pelvic pain since 09/21/2017. Estimated gestational age by LMP is 5 weeks 1 day. Quantitative beta HCG is pending. EXAM: OBSTETRIC <14 WK US AND TRANSVAGINAL OB US TECHNIQUE: Both transabdominal and transvaginal ultrasound examinations were performed for complete evaluation of the gestation as well as the maternal uterus, adnexal regions, and pelvic cul-de-sac. Transvaginal technique was performed to assess early pregnancy. COMPARISON:  None. FINDINGS: Intrauterine gestational sac: A single intrauterine gestational sac is identified. Yolk sac:  Not Visualized. Embryo:  Not Visualized. Cardiac Activity: Not Visualized. MSD: 5.9  mm   5 w   2  d Subchorionic hemorrhage:  None visualized. Maternal uterus/adnexae: The uterus is anteverted. The endometrium is somewhat thickened and heterogeneous with possible increased vascularity of the anterior fundal level. This appearance is nonspecific but could indicate gestational trophoblastic disease, possibly representing a partial molar pregnancy. No abnormal endometrial fluid. Both ovaries are visualized and are unremarkable in appearance. No abnormal adnexal masses. Probable corpus luteal cyst on the right. Small amount of free fluid in the pelvis. IMPRESSION: Probable early intrauterine gestational sac, but no yolk sac, fetal pole, or cardiac activity yet visualized. Nonspecific heterogeneous appearance of the endometrium. Recommend follow-up quantitative B-HCG levels and follow-up US in 14 days to assess viability. This recommendation follows SRU consensus guidelines: Diagnostic Criteria for Nonviable Pregnancy Early in the First Trimester.  Malva Limes Engl J Med 2013; 161:0960-45; 369:1443-51. Electronically Signed   By: Burman NievesWilliam  Stevens M.D.   On: 09/29/2017 22:13    Assessment:  7982w0d weeks gestation   Now has single IUP with heartbeat    Plan: The patient is instructed to follow up in in the next few weeks for prenatal care Recommend pelvic rest.  Wynelle BourgeoisMarie Keyundra Wallace 10/05/2017, 3:57 PM

## 2017-10-05 NOTE — Discharge Instructions (Signed)
First Trimester of Pregnancy °The first trimester of pregnancy is from week 1 until the end of week 13 (months 1 through 3). A week after a sperm fertilizes an egg, the egg will implant on the wall of the uterus. This embryo will begin to develop into a baby. Genes from you and your partner will form the baby. The female genes will determine whether the baby will be a boy or a girl. At 6-8 weeks, the eyes and face will be formed, and the heartbeat can be seen on ultrasound. At the end of 12 weeks, all the baby's organs will be formed. °Now that you are pregnant, you will want to do everything you can to have a healthy baby. Two of the most important things are to get good prenatal care and to follow your health care provider's instructions. Prenatal care is all the medical care you receive before the baby's birth. This care will help prevent, find, and treat any problems during the pregnancy and childbirth. °Body changes during your first trimester °Your body goes through many changes during pregnancy. The changes vary from woman to woman. °· You may gain or lose a couple of pounds at first. °· You may feel sick to your stomach (nauseous) and you may throw up (vomit). If the vomiting is uncontrollable, call your health care provider. °· You may tire easily. °· You may develop headaches that can be relieved by medicines. All medicines should be approved by your health care provider. °· You may urinate more often. Painful urination may mean you have a bladder infection. °· You may develop heartburn as a result of your pregnancy. °· You may develop constipation because certain hormones are causing the muscles that push stool through your intestines to slow down. °· You may develop hemorrhoids or swollen veins (varicose veins). °· Your breasts may begin to grow larger and become tender. Your nipples may stick out more, and the tissue that surrounds them (areola) may become darker. °· Your gums may bleed and may be  sensitive to brushing and flossing. °· Dark spots or blotches (chloasma, mask of pregnancy) may develop on your face. This will likely fade after the baby is born. °· Your menstrual periods will stop. °· You may have a loss of appetite. °· You may develop cravings for certain kinds of food. °· You may have changes in your emotions from day to day, such as being excited to be pregnant or being concerned that something may go wrong with the pregnancy and baby. °· You may have more vivid and strange dreams. °· You may have changes in your hair. These can include thickening of your hair, rapid growth, and changes in texture. Some women also have hair loss during or after pregnancy, or hair that feels dry or thin. Your hair will most likely return to normal after your baby is born. ° °What to expect at prenatal visits °During a routine prenatal visit: °· You will be weighed to make sure you and the baby are growing normally. °· Your blood pressure will be taken. °· Your abdomen will be measured to track your baby's growth. °· The fetal heartbeat will be listened to between weeks 10 and 14 of your pregnancy. °· Test results from any previous visits will be discussed. ° °Your health care provider may ask you: °· How you are feeling. °· If you are feeling the baby move. °· If you have had any abnormal symptoms, such as leaking fluid, bleeding, severe headaches,   or abdominal cramping. °· If you are using any tobacco products, including cigarettes, chewing tobacco, and electronic cigarettes. °· If you have any questions. ° °Other tests that may be performed during your first trimester include: °· Blood tests to find your blood type and to check for the presence of any previous infections. The tests will also be used to check for low iron levels (anemia) and protein on red blood cells (Rh antibodies). Depending on your risk factors, or if you previously had diabetes during pregnancy, you may have tests to check for high blood  sugar that affects pregnant women (gestational diabetes). °· Urine tests to check for infections, diabetes, or protein in the urine. °· An ultrasound to confirm the proper growth and development of the baby. °· Fetal screens for spinal cord problems (spina bifida) and Down syndrome. °· HIV (human immunodeficiency virus) testing. Routine prenatal testing includes screening for HIV, unless you choose not to have this test. °· You may need other tests to make sure you and the baby are doing well. ° °Follow these instructions at home: °Medicines °· Follow your health care provider's instructions regarding medicine use. Specific medicines may be either safe or unsafe to take during pregnancy. °· Take a prenatal vitamin that contains at least 600 micrograms (mcg) of folic acid. °· If you develop constipation, try taking a stool softener if your health care provider approves. °Eating and drinking °· Eat a balanced diet that includes fresh fruits and vegetables, whole grains, good sources of protein such as meat, eggs, or tofu, and low-fat dairy. Your health care provider will help you determine the amount of weight gain that is right for you. °· Avoid raw meat and uncooked cheese. These carry germs that can cause birth defects in the baby. °· Eating four or five small meals rather than three large meals a day may help relieve nausea and vomiting. If you start to feel nauseous, eating a few soda crackers can be helpful. Drinking liquids between meals, instead of during meals, also seems to help ease nausea and vomiting. °· Limit foods that are high in fat and processed sugars, such as fried and sweet foods. °· To prevent constipation: °? Eat foods that are high in fiber, such as fresh fruits and vegetables, whole grains, and beans. °? Drink enough fluid to keep your urine clear or pale yellow. °Activity °· Exercise only as directed by your health care provider. Most women can continue their usual exercise routine during  pregnancy. Try to exercise for 30 minutes at least 5 days a week. Exercising will help you: °? Control your weight. °? Stay in shape. °? Be prepared for labor and delivery. °· Experiencing pain or cramping in the lower abdomen or lower back is a good sign that you should stop exercising. Check with your health care provider before continuing with normal exercises. °· Try to avoid standing for long periods of time. Move your legs often if you must stand in one place for a long time. °· Avoid heavy lifting. °· Wear low-heeled shoes and practice good posture. °· You may continue to have sex unless your health care provider tells you not to. °Relieving pain and discomfort °· Wear a good support bra to relieve breast tenderness. °· Take warm sitz baths to soothe any pain or discomfort caused by hemorrhoids. Use hemorrhoid cream if your health care provider approves. °· Rest with your legs elevated if you have leg cramps or low back pain. °· If you develop   varicose veins in your legs, wear support hose. Elevate your feet for 15 minutes, 3-4 times a day. Limit salt in your diet. Prenatal care  Schedule your prenatal visits by the twelfth week of pregnancy. They are usually scheduled monthly at first, then more often in the last 2 months before delivery.  Write down your questions. Take them to your prenatal visits.  Keep all your prenatal visits as told by your health care provider. This is important. Safety  Wear your seat belt at all times when driving.  Make a list of emergency phone numbers, including numbers for family, friends, the hospital, and police and fire departments. General instructions  Ask your health care provider for a referral to a local prenatal education class. Begin classes no later than the beginning of month 6 of your pregnancy.  Ask for help if you have counseling or nutritional needs during pregnancy. Your health care provider can offer advice or refer you to specialists for help  with various needs.  Do not use hot tubs, steam rooms, or saunas.  Do not douche or use tampons or scented sanitary pads.  Do not cross your legs for long periods of time.  Avoid cat litter boxes and soil used by cats. These carry germs that can cause birth defects in the baby and possibly loss of the fetus by miscarriage or stillbirth.  Avoid all smoking, herbs, alcohol, and medicines not prescribed by your health care provider. Chemicals in these products affect the formation and growth of the baby.  Do not use any products that contain nicotine or tobacco, such as cigarettes and e-cigarettes. If you need help quitting, ask your health care provider. You may receive counseling support and other resources to help you quit.  Schedule a dentist appointment. At home, brush your teeth with a soft toothbrush and be gentle when you floss. Contact a health care provider if:  You have dizziness.  You have mild pelvic cramps, pelvic pressure, or nagging pain in the abdominal area.  You have persistent nausea, vomiting, or diarrhea.  You have a bad smelling vaginal discharge.  You have pain when you urinate.  You notice increased swelling in your face, hands, legs, or ankles.  You are exposed to fifth disease or chickenpox.  You are exposed to [place] measles (rubella) and have never had it. Get help right away if:  You have a fever.  You are leaking fluid from your vagina.  You have spotting or bleeding from your vagina.  You have severe abdominal cramping or pain.  You have rapid weight gain or loss.  You vomit blood or material that looks like coffee grounds.  You develop a severe headache.  You have shortness of breath.  You have any kind of trauma, such as from a fall or a car accident. Summary  The first trimester of pregnancy is from week 1 until the end of week 13 (months 1 through 3).  Your body goes through many changes during pregnancy. The changes vary from  woman to woman.  You will have routine prenatal visits. During those visits, your health care provider will examine you, discuss any test results you may have, and talk with you about how you are feeling. This information is not intended to replace advice given to you by your health care provider. Make sure you discuss any questions you have with your health care provider. Document Released: 11/27/2001 Document Revised: 11/14/2016 Document Reviewed: 11/14/2016 Elsevier Interactive Patient Education  2017 Elsevier  Inc. Prenatal Care Providers [facility]    [facility]  [phone]     Phone: [phone]          [facility]                      [facility]  @[place]     Phone: [phone]  Phone: [phone]         [facility]     Phone: [phone]  Phone: [phone]           [facility] [place]                hone: [phone]  Phone: [phone]         [facility] [address]      Phone: [phone]  Phone: [phone]         [facility]                Phone: [phone]  [facility]   [address]:[phone]    [facility] [place]          Phone: [phone] [facility]   Phone: [phone]

## 2017-10-16 ENCOUNTER — Encounter (HOSPITAL_COMMUNITY): Payer: Self-pay | Admitting: *Deleted

## 2017-10-16 ENCOUNTER — Inpatient Hospital Stay (HOSPITAL_COMMUNITY)
Admission: AD | Admit: 2017-10-16 | Discharge: 2017-10-17 | Disposition: A | Discharge status: Home / Self Care | Payer: Medicaid Other | Source: Ambulatory Visit | Attending: Family Medicine | Admitting: Family Medicine

## 2017-10-16 DIAGNOSIS — M545 Low back pain: Secondary | ICD-10-CM | POA: Diagnosis not present

## 2017-10-16 DIAGNOSIS — Z3A01 Less than 8 weeks gestation of pregnancy: Secondary | ICD-10-CM | POA: Diagnosis not present

## 2017-10-16 DIAGNOSIS — O26891 Other specified pregnancy related conditions, first trimester: Secondary | ICD-10-CM | POA: Insufficient documentation

## 2017-10-16 DIAGNOSIS — O219 Vomiting of pregnancy, unspecified: Secondary | ICD-10-CM | POA: Diagnosis not present

## 2017-10-16 DIAGNOSIS — O210 Mild hyperemesis gravidarum: Secondary | ICD-10-CM | POA: Insufficient documentation

## 2017-10-16 DIAGNOSIS — R112 Nausea with vomiting, unspecified: Secondary | ICD-10-CM | POA: Diagnosis present

## 2017-10-16 LAB — URINALYSIS, ROUTINE W REFLEX MICROSCOPIC
BACTERIA UA: NONE SEEN
BILIRUBIN URINE: NEGATIVE
Glucose, UA: NEGATIVE mg/dL
HGB URINE DIPSTICK: NEGATIVE
KETONES UR: 20 mg/dL — AB
NITRITE: NEGATIVE
PROTEIN: 100 mg/dL — AB
Specific Gravity, Urine: 1.026 (ref 1.005–1.030)
pH: 6 (ref 5.0–8.0)

## 2017-10-16 MED ORDER — M.V.I. ADULT IV INJ
INJECTION | Freq: Once | INTRAVENOUS | Status: DC
  Filled 2017-10-16: qty 1000

## 2017-10-16 MED ORDER — SODIUM CHLORIDE 0.9 % IV SOLN
Freq: Once | INTRAVENOUS | Status: AC
  Administered 2017-10-16: 23:00:00 via INTRAVENOUS
  Filled 2017-10-16: qty 1000

## 2017-10-16 MED ORDER — M.V.I. ADULT IV INJ
Freq: Once | INTRAVENOUS | Status: AC
  Administered 2017-10-17: via INTRAVENOUS
  Filled 2017-10-16: qty 10

## 2017-10-16 MED ORDER — PROMETHAZINE HCL 25 MG/ML IJ SOLN
25.0000 mg | Freq: Once | INTRAVENOUS | Status: DC
  Filled 2017-10-16: qty 1

## 2017-10-16 MED ORDER — SODIUM CHLORIDE 0.9 % IV SOLN: INTRAVENOUS | Status: DC

## 2017-10-16 NOTE — MAU Provider Note (Signed)
History     CSN: [id number]  Arrival date and time: 10/16/17 2152   First Provider Initiated Contact with Patient 10/16/17 2226      Chief Complaint  Patient presents with  . Emesis  . Back Pain   HPI  Ms. Shericka Jenne CampusLatrese Scheu is a 21 y.o. G2P0010 at 769w4d gestation by U/S presenting to MAU with complaints of N/V since last week and unable to keep any food or fluids down since it started. She does not have any nausea medicine at home, so she hasn't taken anything. She reports seeing blood in the vomit today. She last tried to eat lunch at 1600, but was not able to keep it down.   Past Medical History:  Diagnosis Date  . Medical history non-contributory     Past Surgical History:  Procedure Laterality Date  . NO PAST SURGERIES      History reviewed. No pertinent family history.  Social History  Substance Use Topics  . Smoking status: Never Smoker  . Smokeless tobacco: Never Used  . Alcohol use No    Allergies: No Known Allergies  Prescriptions Prior to Admission  Medication Sig Dispense Refill Last Dose  . Prenatal Multivit-Min-Fe-FA (PRENATAL VITAMINS) 0.8 MG tablet Take 1 tablet by mouth daily. 30 tablet 12     Review of Systems  Constitutional: Positive for appetite change and fatigue.  HENT: Negative.   Eyes: Negative.   Respiratory: Negative.   Cardiovascular: Negative.   Gastrointestinal: Positive for nausea and vomiting (blood in vomit today).  Endocrine: Negative.   Genitourinary: Negative.   Musculoskeletal: Positive for back pain (lower).  Skin: Negative.   Allergic/Immunologic: Negative.   Neurological: Negative.   Hematological: Negative.   Psychiatric/Behavioral: Negative.    Physical Exam   Blood pressure (!) 97/58, pulse 81, temperature 98.5 F (36.9 C), temperature source Oral, resp. rate 18, height 5' 4.5" (1.638 m), weight 100 lb (45.4 kg), last menstrual period 08/24/2017, SpO2 100 %.  Physical Exam  Nursing note and vitals  reviewed. Constitutional: She is oriented to person, place, and time. She appears well-developed and well-nourished.  fatigued  HENT:  Head: Normocephalic.  Dry lips  Eyes: Pupils are equal, round, and reactive to light.  Neck: Normal range of motion.  Cardiovascular: Normal rate, regular rhythm and normal heart sounds.   Respiratory: Effort normal and breath sounds normal.  GI: Soft. Normal appearance. Bowel sounds are decreased.  Genitourinary:  Genitourinary Comments: Pelvic deferred  Musculoskeletal: Normal range of motion.  Neurological: She is alert and oriented to person, place, and time.  Skin: Skin is warm and dry.  Psychiatric: She has a normal mood and affect. Her behavior is normal. Judgment and thought content normal.    MAU Course  Procedures  MDM CCUA IVFs: Phenergan 25 mg in D5LR 1000 ml at 999 ml/hr; then MVI in 0.9% NaCl 1000 ml at 500 ml/hr -- N/V improved, able to go to the BR more since IVFs  Results for orders placed or performed during the hospital encounter of 10/16/17 (from the past 24 hour(s))  Urinalysis, Routine w reflex microscopic     Status: Abnormal   Collection Time: 10/16/17  9:57 PM  Result Value Ref Range   Color, Urine YELLOW YELLOW   APPearance HAZY (A) CLEAR   Specific Gravity, Urine 1.026 1.005 - 1.030   pH 6.0 5.0 - 8.0   Glucose, UA NEGATIVE NEGATIVE mg/dL   Hgb urine dipstick NEGATIVE NEGATIVE   Bilirubin Urine NEGATIVE NEGATIVE  Ketones, ur 20 (A) NEGATIVE mg/dL   Protein, ur 478 (A) NEGATIVE mg/dL   Nitrite NEGATIVE NEGATIVE   Leukocytes, UA MODERATE (A) NEGATIVE   RBC / HPF 0-5 0 - 5 RBC/hpf   WBC, UA 6-30 0 - 5 WBC/hpf   Bacteria, UA NONE SEEN NONE SEEN   Squamous Epithelial / LPF 0-5 (A) NONE SEEN   Mucus PRESENT     Assessment and Plan  Morning sickness - Information provided on eating plan for hyperemesis, morning sickness, first trimester pregnancy, how a baby grows, dental work in pregnancy  - Eat small, frequent  meals/snacks every 2-3 hours. Include a lean protein with each meal/snack. Stay well-hydrated with 8 bottles of water (or the equivalent) every day. - Rx for Phenergan 12.5 mg PO every 6 hrs hs prn N/V - Rx for Reglan 10 mg PO TID - Rx for Zantac 150 mg PO BID - Schedule PNC with [facility]  - Return to MAU for OB emergencies  Discharge home Patient verbalized an understanding of the plan of care and agrees.   Raelyn Mora, MSN, CNM 10/16/2017, 10:29 PM

## 2017-10-16 NOTE — MAU Note (Signed)
Pt c/o nausea and vomiting that started last week. States she has not been able to keep anything down since it first started. States she started having streaks of blood 2 days ago. Noticed more blood yesterday. Pt does not have nausea medicine at home. Pt states she also has lower back pain that started 2 days ago-constant and cramping. States she has used heating-helps a little bit.

## 2017-10-17 DIAGNOSIS — O219 Vomiting of pregnancy, unspecified: Secondary | ICD-10-CM | POA: Diagnosis not present

## 2017-10-17 MED ORDER — METOCLOPRAMIDE HCL 10 MG PO TABS: 10.0000 mg | ORAL_TABLET | Freq: Four times a day (QID) | ORAL | 3 refills | Status: DC

## 2017-10-17 MED ORDER — RANITIDINE HCL 150 MG PO TABS: 150.0000 mg | ORAL_TABLET | Freq: Two times a day (BID) | ORAL | 3 refills | Status: DC

## 2017-10-17 MED ORDER — PROMETHAZINE HCL 12.5 MG PO TABS: 12.5000 mg | ORAL_TABLET | Freq: Four times a day (QID) | ORAL | 1 refills | Status: DC | PRN

## 2017-10-17 NOTE — Discharge Instructions (Signed)
Eat small, frequent meals/snacks every 2-3 hours. Include a lean protein with each meal/snack. Stay well-hydrated with 8 bottles of water (or the equivalent) every day.

## 2017-10-21 ENCOUNTER — Inpatient Hospital Stay (HOSPITAL_COMMUNITY)
Admission: AD | Admit: 2017-10-21 | Discharge: 2017-10-21 | Disposition: A | Discharge status: Home / Self Care | Payer: Medicaid Other | Source: Ambulatory Visit | Attending: Obstetrics & Gynecology | Admitting: Obstetrics & Gynecology

## 2017-10-21 ENCOUNTER — Encounter (HOSPITAL_COMMUNITY): Payer: Self-pay | Admitting: *Deleted

## 2017-10-21 DIAGNOSIS — N898 Other specified noninflammatory disorders of vagina: Secondary | ICD-10-CM | POA: Diagnosis not present

## 2017-10-21 DIAGNOSIS — O219 Vomiting of pregnancy, unspecified: Secondary | ICD-10-CM | POA: Diagnosis not present

## 2017-10-21 DIAGNOSIS — R109 Unspecified abdominal pain: Secondary | ICD-10-CM | POA: Insufficient documentation

## 2017-10-21 DIAGNOSIS — M549 Dorsalgia, unspecified: Secondary | ICD-10-CM | POA: Insufficient documentation

## 2017-10-21 DIAGNOSIS — N760 Acute vaginitis: Secondary | ICD-10-CM | POA: Diagnosis not present

## 2017-10-21 DIAGNOSIS — Z79899 Other long term (current) drug therapy: Secondary | ICD-10-CM | POA: Diagnosis not present

## 2017-10-21 DIAGNOSIS — O26891 Other specified pregnancy related conditions, first trimester: Secondary | ICD-10-CM | POA: Insufficient documentation

## 2017-10-21 DIAGNOSIS — B3731 Acute candidiasis of vulva and vagina: Secondary | ICD-10-CM

## 2017-10-21 DIAGNOSIS — O210 Mild hyperemesis gravidarum: Secondary | ICD-10-CM | POA: Insufficient documentation

## 2017-10-21 DIAGNOSIS — B9689 Other specified bacterial agents as the cause of diseases classified elsewhere: Secondary | ICD-10-CM | POA: Diagnosis not present

## 2017-10-21 DIAGNOSIS — O98811 Other maternal infectious and parasitic diseases complicating pregnancy, first trimester: Secondary | ICD-10-CM | POA: Insufficient documentation

## 2017-10-21 DIAGNOSIS — B373 Candidiasis of vulva and vagina: Secondary | ICD-10-CM | POA: Diagnosis not present

## 2017-10-21 DIAGNOSIS — O9989 Other specified diseases and conditions complicating pregnancy, childbirth and the puerperium: Secondary | ICD-10-CM | POA: Diagnosis not present

## 2017-10-21 DIAGNOSIS — Z3A08 8 weeks gestation of pregnancy: Secondary | ICD-10-CM | POA: Diagnosis not present

## 2017-10-21 LAB — URINALYSIS, ROUTINE W REFLEX MICROSCOPIC
Bilirubin Urine: NEGATIVE
GLUCOSE, UA: NEGATIVE mg/dL
Hgb urine dipstick: NEGATIVE
KETONES UR: 5 mg/dL — AB
Nitrite: NEGATIVE
PROTEIN: NEGATIVE mg/dL
Specific Gravity, Urine: 1.023 (ref 1.005–1.030)
pH: 6 (ref 5.0–8.0)

## 2017-10-21 LAB — WET PREP, GENITAL
Sperm: NONE SEEN
TRICH WET PREP: NONE SEEN

## 2017-10-21 MED ORDER — METRONIDAZOLE 0.75 % VA GEL: 1.0000 | Freq: Two times a day (BID) | VAGINAL | 0 refills | Status: DC

## 2017-10-21 MED ORDER — TERCONAZOLE 0.8 % VA CREA: 1.0000 | TOPICAL_CREAM | Freq: Every day | VAGINAL | 0 refills | Status: DC

## 2017-10-21 NOTE — MAU Note (Signed)
Pt presents to MAU with complaints of vomiting, lower back and abdominal pain for approximately 2 weeks

## 2017-10-21 NOTE — MAU Provider Note (Signed)
History     CSN: [id number]  Arrival date and time: 10/21/17 1643   First Provider Initiated Contact with Patient 10/21/17 2009      Chief Complaint  Patient presents with  . Emesis  . Back Pain  . Abdominal Pain   HPI April Wallace is a 21 y.o. G2P0010 at 4212w2d who presents with n/v & abdominal pain. Reports continued n/v since last MAU visit. Was prescribed reglan, zantac, & phenergan which she has been taking. Has vomited twice today. Reports feeling nauseated since eating [employer] this afternoon.  Reports lower abdominal cramping since Saturday. Endorses vaginal discharge & irritation. Denies dysuria, vaginal bleeding, diarrhea, fever/chills, or constipation.   OB History    Gravida Para Term Preterm AB Living   2       1     SAB TAB Ectopic Multiple Live Births                  Past Medical History:  Diagnosis Date  . Medical history non-contributory     Past Surgical History:  Procedure Laterality Date  . NO PAST SURGERIES      No family history on file.  Social History   Tobacco Use  . Smoking status: Never Smoker  . Smokeless tobacco: Never Used  Substance Use Topics  . Alcohol use: No  . Drug use: No    Allergies: No Known Allergies  Medications Prior to Admission  Medication Sig Dispense Refill Last Dose  . metoCLOPramide (REGLAN) 10 MG tablet Take 1 tablet (10 mg total) by mouth every 6 (six) hours. 30 tablet 3   . Prenatal Multivit-Min-Fe-FA (PRENATAL VITAMINS) 0.8 MG tablet Take 1 tablet by mouth daily. 30 tablet 12   . promethazine (PHENERGAN) 12.5 MG tablet Take 1 tablet (12.5 mg total) by mouth every 6 (six) hours as needed for nausea or vomiting. 30 tablet 1   . ranitidine (ZANTAC) 150 MG tablet Take 1 tablet (150 mg total) by mouth 2 (two) times daily. 60 tablet 3     Review of Systems  Constitutional: Negative.   Gastrointestinal: Positive for abdominal pain, nausea and vomiting. Negative for constipation and diarrhea.   Genitourinary: Positive for vaginal discharge. Negative for dysuria and vaginal bleeding.   Physical Exam   Blood pressure 114/60, pulse 96, temperature 98.5 F (36.9 C), resp. rate 16, weight 104 lb (47.2 kg), last menstrual period 08/24/2017.  Physical Exam  Nursing note and vitals reviewed. Constitutional: She is oriented to person, place, and time. She appears well-developed and well-nourished. No distress.  HENT:  Head: Normocephalic and atraumatic.  Eyes: Conjunctivae are normal. Right eye exhibits no discharge. Left eye exhibits no discharge. No scleral icterus.  Neck: Normal range of motion.  Respiratory: Effort normal. No respiratory distress.  GI: Soft. She exhibits no distension. There is no tenderness. There is no rebound.  Genitourinary: Cervix exhibits no motion tenderness. There is erythema and tenderness in the vagina. No bleeding in the vagina. Vaginal discharge found.  Genitourinary Comments: Cervix closed  Neurological: She is alert and oriented to person, place, and time.  Skin: Skin is warm and dry. She is not diaphoretic.  Psychiatric: She has a normal mood and affect. Her behavior is normal. Judgment and thought content normal.    MAU Course  Procedures Results for orders placed or performed during the hospital encounter of 10/21/17 (from the past 24 hour(s))  Urinalysis, Routine w reflex microscopic     Status: Abnormal   Collection  Time: 10/21/17  5:17 PM  Result Value Ref Range   Color, Urine YELLOW YELLOW   APPearance CLEAR CLEAR   Specific Gravity, Urine 1.023 1.005 - 1.030   pH 6.0 5.0 - 8.0   Glucose, UA NEGATIVE NEGATIVE mg/dL   Hgb urine dipstick NEGATIVE NEGATIVE   Bilirubin Urine NEGATIVE NEGATIVE   Ketones, ur 5 (A) NEGATIVE mg/dL   Protein, ur NEGATIVE NEGATIVE mg/dL   Nitrite NEGATIVE NEGATIVE   Leukocytes, UA TRACE (A) NEGATIVE   RBC / HPF 0-5 0 - 5 RBC/hpf   WBC, UA 0-5 0 - 5 WBC/hpf   Bacteria, UA RARE (A) NONE SEEN   Squamous  Epithelial / LPF 0-5 (A) NONE SEEN   Mucus PRESENT   Wet prep, genital     Status: Abnormal   Collection Time: 10/21/17  7:55 PM  Result Value Ref Range   Yeast Wet Prep HPF POC PRESENT (A) NONE SEEN   Trich, Wet Prep NONE SEEN NONE SEEN   Clue Cells Wet Prep HPF POC PRESENT (A) NONE SEEN   WBC, Wet Prep HPF POC MODERATE (A) NONE SEEN   Sperm NONE SEEN     MDM IUP confirmed during previous visit. Cervix closed today. Patient drinking milkshake in MAU & not observed vomiting.  Wet prep collected -- + clue & yeast  Assessment and Plan  A: 1. Vaginal yeast infection   2. BV (bacterial vaginosis)   3. Nausea and vomiting during pregnancy prior to [redacted] weeks gestation   4. [redacted] weeks gestation of pregnancy    P: Discharge home Rx terazol & metrogel Start prenatal care Continue antiemetics as previously prescribed. Discussed diet adjustments to help with n/v symptoms Discussed reasons to return to MAU  April Wallace 10/21/2017, 8:10 PM

## 2017-10-21 NOTE — Discharge Instructions (Signed)
Morning Sickness °Morning sickness is when you feel sick to your stomach (nauseous) during pregnancy. This nauseous feeling may or may not come with vomiting. It often occurs in the morning but can be a problem any time of day. Morning sickness is most common during the first trimester, but it may continue throughout pregnancy. While morning sickness is unpleasant, it is usually harmless unless you develop severe and continual vomiting (hyperemesis gravidarum). This condition requires more intense treatment. °What are the causes? °The cause of morning sickness is not completely known but seems to be related to normal hormonal changes that occur in pregnancy. °What increases the risk? °You are at greater risk if you: °· Experienced nausea or vomiting before your pregnancy. °· Had morning sickness during a previous pregnancy. °· Are pregnant with more than one baby, such as twins. ° °How is this treated? °Do not use any medicines (prescription, over-the-counter, or herbal) for morning sickness without first talking to your health care provider. Your health care provider may prescribe or recommend: °· Vitamin B6 supplements. °· Anti-nausea medicines. °· The herbal medicine ginger. ° °Follow these instructions at home: °· Only take over-the-counter or prescription medicines as directed by your health care provider. °· Taking multivitamins before getting pregnant can prevent or decrease the severity of morning sickness in most women. °· Eat a piece of dry toast or unsalted crackers before getting out of bed in the morning. °· Eat five or six small meals a day. °· Eat dry and bland foods (rice, baked potato). Foods high in carbohydrates are often helpful. °· Do not drink liquids with your meals. Drink liquids between meals. °· Avoid greasy, fatty, and spicy foods. °· Get someone to cook for you if the smell of any food causes nausea and vomiting. °· If you feel nauseous after taking prenatal vitamins, take the vitamins at  night or with a snack. °· Snack on protein foods (nuts, yogurt, cheese) between meals if you are hungry. °· Eat unsweetened gelatins for desserts. °· Wearing an acupressure wristband (worn for sea sickness) may be helpful. °· Acupuncture may be helpful. °· Do not smoke. °· Get a humidifier to keep the air in your house free of odors. °· Get plenty of fresh air. °Contact a health care provider if: °· Your home remedies are not working, and you need medicine. °· You feel dizzy or lightheaded. °· You are losing weight. °Get help right away if: °· You have persistent and uncontrolled nausea and vomiting. °· You pass out (faint). °This information is not intended to replace advice given to you by your health care provider. Make sure you discuss any questions you have with your health care provider. °Document Released: 01/24/2007 Document Revised: 05/10/2016 Document Reviewed: 05/20/2013 °Elsevier Interactive Patient Education © 2017 Else[employer] Inc[employer] ° ° °[facility] ° ° °[facility]       Phone: [phone] ° °[facility] Phone: [phone] ° °[facility] [place]  Phone: [phone] ° °[facility] [place]  Phone: [phone] ° °[facility] [place]  Phone: [phone] ° °[facility]       Phone: [phone] ° °[facility]    Phone: [phone]  ° °[facility] ([place])    Phone: [phone] ° °[facility]    Phone: [phone] ° °[facility]    Phone: [phone]  ° °[facility]  Phone: [phone] ° °[facility]    Phone: [phone] ° °[facility]   Phone: [phone] ° °[facility]    Phone: [phone] ° °

## 2017-10-23 LAB — CULTURE, OB URINE: Culture: <10000 — AB

## 2017-10-30 ENCOUNTER — Inpatient Hospital Stay (HOSPITAL_COMMUNITY)
Admission: AD | Admit: 2017-10-30 | Discharge: 2017-10-30 | Disposition: A | Discharge status: Home / Self Care | Payer: Medicaid Other | Source: Ambulatory Visit | Attending: Obstetrics and Gynecology | Admitting: Obstetrics and Gynecology

## 2017-10-30 ENCOUNTER — Encounter (HOSPITAL_COMMUNITY): Payer: Self-pay

## 2017-10-30 DIAGNOSIS — R109 Unspecified abdominal pain: Secondary | ICD-10-CM

## 2017-10-30 DIAGNOSIS — Z3A09 9 weeks gestation of pregnancy: Secondary | ICD-10-CM | POA: Insufficient documentation

## 2017-10-30 DIAGNOSIS — B3731 Acute candidiasis of vulva and vagina: Secondary | ICD-10-CM | POA: Diagnosis present

## 2017-10-30 DIAGNOSIS — O98811 Other maternal infectious and parasitic diseases complicating pregnancy, first trimester: Secondary | ICD-10-CM | POA: Diagnosis not present

## 2017-10-30 DIAGNOSIS — O23591 Infection of other part of genital tract in pregnancy, first trimester: Secondary | ICD-10-CM | POA: Insufficient documentation

## 2017-10-30 DIAGNOSIS — B9689 Other specified bacterial agents as the cause of diseases classified elsewhere: Secondary | ICD-10-CM | POA: Diagnosis present

## 2017-10-30 DIAGNOSIS — B373 Candidiasis of vulva and vagina: Secondary | ICD-10-CM | POA: Diagnosis not present

## 2017-10-30 DIAGNOSIS — O210 Mild hyperemesis gravidarum: Secondary | ICD-10-CM

## 2017-10-30 DIAGNOSIS — O26899 Other specified pregnancy related conditions, unspecified trimester: Secondary | ICD-10-CM | POA: Diagnosis present

## 2017-10-30 DIAGNOSIS — N760 Acute vaginitis: Secondary | ICD-10-CM

## 2017-10-30 LAB — URINALYSIS, ROUTINE W REFLEX MICROSCOPIC
BILIRUBIN URINE: NEGATIVE
Glucose, UA: NEGATIVE mg/dL
HGB URINE DIPSTICK: NEGATIVE
KETONES UR: NEGATIVE mg/dL
NITRITE: NEGATIVE
PROTEIN: NEGATIVE mg/dL
SPECIFIC GRAVITY, URINE: 1.015 (ref 1.005–1.030)
pH: 7 (ref 5.0–8.0)

## 2017-10-30 LAB — URINALYSIS, MICROSCOPIC (REFLEX)

## 2017-10-30 NOTE — MAU Note (Signed)
Pt reports lower abd pain today, pain was so bad she could barely walk. Denies bleeding.

## 2017-10-30 NOTE — MAU Provider Note (Signed)
History     CSN: [id number]  Arrival date and time: 10/30/17 [id number]   First Provider Initiated Contact with Patient 10/30/17 1813      Chief Complaint  Patient presents with  . Abdominal Pain   HPI  Ms. April Wallace is a 21 y.o. G2P0010 at 5580w4d gestation presenting to MAU with complaints of abdominal pain and continued N/V. She states the pain is so bad that she can barely walk. She was dx'd with BV and yeast on 11/5, but has not taken any of the medication.  Past Medical History:  Diagnosis Date  . Medical history non-contributory     Past Surgical History:  Procedure Laterality Date  . NO PAST SURGERIES      Family History  Problem Relation Age of Onset  . Diabetes Maternal Grandmother   . Seizures Maternal Grandmother   . Asthma Maternal Grandmother   . Cancer Maternal Grandfather     Social History   Tobacco Use  . Smoking status: Never Smoker  . Smokeless tobacco: Never Used  Substance Use Topics  . Alcohol use: No  . Drug use: No    Allergies: No Known Allergies  Medications Prior to Admission  Medication Sig Dispense Refill Last Dose  . metoCLOPramide (REGLAN) 10 MG tablet Take 1 tablet (10 mg total) by mouth every 6 (six) hours. 30 tablet 3   . metroNIDAZOLE (METROGEL VAGINAL) 0.75 % vaginal gel Place 1 Applicatorful 2 (two) times daily vaginally. 70 g 0   . Prenatal Multivit-Min-Fe-FA (PRENATAL VITAMINS) 0.8 MG tablet Take 1 tablet by mouth daily. 30 tablet 12   . promethazine (PHENERGAN) 12.5 MG tablet Take 1 tablet (12.5 mg total) by mouth every 6 (six) hours as needed for nausea or vomiting. 30 tablet 1   . ranitidine (ZANTAC) 150 MG tablet Take 1 tablet (150 mg total) by mouth 2 (two) times daily. 60 tablet 3   . terconazole (TERAZOL 3) 0.8 % vaginal cream Place 1 applicator at bedtime vaginally. 20 g 0     Review of Systems  Constitutional: Negative.   HENT: Negative.   Eyes: Negative.   Respiratory: Negative.   Cardiovascular:  Negative.   Gastrointestinal: Positive for abdominal pain (cramping), nausea and vomiting.  Endocrine: Negative.   Genitourinary: Positive for pelvic pain.  Musculoskeletal: Negative.   Skin: Negative.   Allergic/Immunologic: Negative.   Neurological: Negative.   Hematological: Negative.   Psychiatric/Behavioral: Negative.    Physical Exam   Blood pressure 104/65, pulse 93, temperature 98.4 F (36.9 C), temperature source Oral, resp. rate 17, height 5' 4.5" (1.638 m), weight 103 lb (46.7 kg), last menstrual period 08/24/2017, SpO2 100 %.  Physical Exam  Nursing note and vitals reviewed. Constitutional: She is oriented to person, place, and time. She appears well-developed and well-nourished.  HENT:  Head: Normocephalic.  Eyes: Pupils are equal, round, and reactive to light.  Neck: Normal range of motion.  Cardiovascular: Normal rate, regular rhythm and normal heart sounds.  Respiratory: Effort normal and breath sounds normal.  GI: Soft. Bowel sounds are normal.  Genitourinary:  Genitourinary Comments: Pelvic deferred  Musculoskeletal: Normal range of motion.  Neurological: She is alert and oriented to person, place, and time.  Skin: Skin is warm and dry.  Psychiatric: She has a normal mood and affect. Her behavior is normal. Judgment and thought content normal.    MAU Course  Procedures  MDM CCUA  Results for orders placed or performed during the hospital encounter of 10/30/17 (  from the past 24 hour(s))  Urinalysis, Routine w reflex microscopic     Status: Abnormal   Collection Time: 10/30/17  4:55 PM  Result Value Ref Range   Color, Urine YELLOW YELLOW   APPearance CLOUDY (A) CLEAR   Specific Gravity, Urine 1.015 1.005 - 1.030   pH 7.0 5.0 - 8.0   Glucose, UA NEGATIVE NEGATIVE mg/dL   Hgb urine dipstick NEGATIVE NEGATIVE   Bilirubin Urine NEGATIVE NEGATIVE   Ketones, ur NEGATIVE NEGATIVE mg/dL   Protein, ur NEGATIVE NEGATIVE mg/dL   Nitrite NEGATIVE NEGATIVE    Leukocytes, UA SMALL (A) NEGATIVE  Urinalysis, Microscopic (reflex)     Status: Abnormal   Collection Time: 10/30/17  4:55 PM  Result Value Ref Range   RBC / HPF 0-5 0 - 5 RBC/hpf   WBC, UA 0-5 0 - 5 WBC/hpf   Bacteria, UA FEW (A) NONE SEEN   Squamous Epithelial / LPF 0-5 (A) NONE SEEN   Amorphous Crystal PRESENT      Assessment and Plan  Bacterial vaginitis -  Metrogel 0.75% and take as prescribed -- advised to use metrogel first - Information provided on BV - Advised it is important to take medication d/t higher chance of uterine cramping and miscarriage  Candida vaginitis - Advised to p/u Rx for Terazol cream and take as prescribed -- advised to use after completing Metrogel course - Information provided on yeast    Abdominal cramping affecting pregnancy - Reassurance given that abd cramping is probably direct result of BV and/or yeast infection. Once those are treated, the cramping should resolve. - May take Tylenol 1000 mg every 6 hrs  Discharge home Patient verbalized an understanding of the plan of care and agrees.   [name], MSN, CNM 10/30/2017, 7:05 PM

## 2017-11-12 ENCOUNTER — Other Ambulatory Visit: Payer: Self-pay

## 2017-11-12 ENCOUNTER — Encounter (HOSPITAL_COMMUNITY): Payer: Self-pay | Admitting: *Deleted

## 2017-11-12 ENCOUNTER — Inpatient Hospital Stay (HOSPITAL_COMMUNITY)
Admission: AD | Admit: 2017-11-12 | Discharge: 2017-11-12 | Disposition: A | Discharge status: Home / Self Care | Payer: Medicaid Other | Source: Ambulatory Visit | Attending: Obstetrics & Gynecology | Admitting: Obstetrics & Gynecology

## 2017-11-12 DIAGNOSIS — R51 Headache: Secondary | ICD-10-CM | POA: Insufficient documentation

## 2017-11-12 DIAGNOSIS — O26891 Other specified pregnancy related conditions, first trimester: Secondary | ICD-10-CM | POA: Insufficient documentation

## 2017-11-12 DIAGNOSIS — O210 Mild hyperemesis gravidarum: Secondary | ICD-10-CM | POA: Diagnosis not present

## 2017-11-12 DIAGNOSIS — R42 Dizziness and giddiness: Secondary | ICD-10-CM | POA: Diagnosis not present

## 2017-11-12 DIAGNOSIS — Z3A11 11 weeks gestation of pregnancy: Secondary | ICD-10-CM | POA: Insufficient documentation

## 2017-11-12 DIAGNOSIS — D509 Iron deficiency anemia, unspecified: Secondary | ICD-10-CM | POA: Diagnosis not present

## 2017-11-12 DIAGNOSIS — O99011 Anemia complicating pregnancy, first trimester: Secondary | ICD-10-CM | POA: Diagnosis not present

## 2017-11-12 DIAGNOSIS — D508 Other iron deficiency anemias: Secondary | ICD-10-CM

## 2017-11-12 HISTORY — DX: Headache, unspecified: R51.9

## 2017-11-12 HISTORY — DX: Headache: R51

## 2017-11-12 LAB — URINALYSIS, ROUTINE W REFLEX MICROSCOPIC
Bilirubin Urine: NEGATIVE
Glucose, UA: NEGATIVE mg/dL
Hgb urine dipstick: NEGATIVE
Ketones, ur: NEGATIVE mg/dL
Nitrite: NEGATIVE
Protein, ur: NEGATIVE mg/dL
Specific Gravity, Urine: 1.019 (ref 1.005–1.030)
pH: 7 (ref 5.0–8.0)

## 2017-11-12 LAB — COMPREHENSIVE METABOLIC PANEL
ALBUMIN: 3.7 g/dL (ref 3.5–5.0)
ALK PHOS: 58 U/L (ref 38–126)
ALT: 8 U/L — AB (ref 14–54)
AST: 14 U/L — AB (ref 15–41)
Anion gap: 7 (ref 5–15)
BILIRUBIN TOTAL: 0.4 mg/dL (ref 0.3–1.2)
BUN: 11 mg/dL (ref 6–20)
CO2: 24 mmol/L (ref 22–32)
CREATININE: 0.43 mg/dL — AB (ref 0.44–1.00)
Calcium: 10.2 mg/dL (ref 8.9–10.3)
Chloride: 102 mmol/L (ref 101–111)
GFR calc Af Amer: >60 mL/min (ref >60)
GFR calc non Af Amer: >60 mL/min (ref >60)
GLUCOSE: 84 mg/dL (ref 65–99)
POTASSIUM: 4.7 mmol/L (ref 3.5–5.1)
Sodium: 133 mmol/L — ABNORMAL LOW (ref 135–145)
TOTAL PROTEIN: 7.7 g/dL (ref 6.5–8.1)

## 2017-11-12 LAB — CBC
HCT: 27.9 % — ABNORMAL LOW (ref 36.0–46.0)
Hemoglobin: 8.8 g/dL — ABNORMAL LOW (ref 12.0–15.0)
MCH: 23.3 pg — ABNORMAL LOW (ref 26.0–34.0)
MCHC: 31.5 g/dL (ref 30.0–36.0)
MCV: 74 fL — ABNORMAL LOW (ref 78.0–100.0)
Platelets: 215 10*3/uL (ref 150–400)
RBC: 3.77 MIL/uL — ABNORMAL LOW (ref 3.87–5.11)
RDW: 17.7 % — ABNORMAL HIGH (ref 11.5–15.5)
WBC: 5.5 10*3/uL (ref 4.0–10.5)

## 2017-11-12 MED ORDER — PRENATAL VITAMIN 27-0.8 MG PO TABS: 1.0000 | ORAL_TABLET | Freq: Once | ORAL | 12 refills | Status: AC

## 2017-11-12 MED ORDER — FERROUS SULFATE 325 (65 FE) MG PO TABS: 325.0000 mg | ORAL_TABLET | Freq: Every day | ORAL | 8 refills | Status: DC

## 2017-11-12 MED ORDER — BUTALBITAL-APAP-CAFFEINE 50-325-40 MG PO TABS
1.0000 | ORAL_TABLET | Freq: Once | ORAL | Status: AC
  Administered 2017-11-12: 1 via ORAL
  Filled 2017-11-12: qty 1

## 2017-11-12 MED ORDER — ONDANSETRON HCL 4 MG PO TABS: 4.0000 mg | ORAL_TABLET | Freq: Three times a day (TID) | ORAL | 0 refills | Status: DC | PRN

## 2017-11-12 MED ORDER — ONDANSETRON HCL 4 MG PO TABS
4.0000 mg | ORAL_TABLET | Freq: Once | ORAL | Status: AC
  Administered 2017-11-12: 4 mg via ORAL
  Filled 2017-11-12: qty 1

## 2017-11-12 NOTE — MAU Provider Note (Signed)
Patient April Wallace is a 21 y.o. G2P0010 At 555w3d here with complaints of headache and dizziness for the past two weeks. She denies vaginal bleeding, abdominal pain or discharge.   She has had multiple MAU visits here for NV; has an RX for promethazine suppositories but states that they didn't work. She took them for two days. She also took reglan for two weeks and she says it didn't help.   The nausea and vomiting has been an on-going issue. She has had a hard time keeping food down; in the past week she has felt nauseated all throughout the day. However, she has been throwing up at least 1-2/day, except yesterday she threw up 3 times. She vomits about an hour after eating or when she lies down.   She has only had fruit today; yesterday she ate eggs and toast for breakfast and muffin for lunch,   then had some fries and potatoes yesterday at 4 pm. 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   History     CSN: [id number]  Arrival date and time: 11/12/17 1240   None     Chief Complaint  Patient presents with  . Headache  . Dizziness   Headache   This is a new problem. The current episode started 1 to 4 weeks ago. The problem occurs intermittently (sometimes come and go and sometimes all day). The problem has been unchanged. The pain is located in the right unilateral (and also in the front. ) region. The quality of the pain is described as aching. The pain is at a severity of 5/10. Associated symptoms include dizziness. Treatments tried: sleeping makes it better but after a while it comes back.  Dizziness  The current episode started 1 to 4 weeks ago. The problem occurs 2 to 4 times per day. The problem has been unchanged. Associated symptoms include headaches. Exacerbated by: she feels worse when she walks or changes position. She has tried nothing for the symptoms.    OB History    Gravida Para Term Preterm AB Living   2       1     SAB TAB Ectopic Multiple Live Births                  Past Medical History:  Diagnosis Date  . Headache     Past Surgical History:  Procedure Laterality Date  . WISDOM TOOTH EXTRACTION      Family History  Problem Relation Age of Onset  . Diabetes Maternal Grandmother   . Seizures Maternal Grandmother   . Asthma Maternal Grandmother   . Cancer Maternal Grandfather     Social History   Tobacco Use  . Smoking status: Never Smoker  . Smokeless tobacco: Never Used  Substance Use Topics  . Alcohol use: No  . Drug use: No    Allergies: No Known Allergies  Medications Prior to Admission  Medication Sig Dispense Refill Last Dose  . metoCLOPramide (REGLAN) 10 MG tablet Take 1 tablet (10 mg total) by mouth every 6 (six) hours. 30 tablet 3   . metroNIDAZOLE (METROGEL VAGINAL) 0.75 % vaginal gel Place 1 Applicatorful 2 (two) times daily vaginally. 70 g 0   . Prenatal Multivit-Min-Fe-FA (PRENATAL VITAMINS) 0.8 MG tablet Take 1 tablet by mouth daily. 30 tablet 12   . promethazine (PHENERGAN) 12.5 MG tablet Take 1 tablet (12.5 mg total) by mouth every 6 (  six) hours as needed for nausea or vomiting. 30 tablet 1   . ranitidine (ZANTAC) 150 MG tablet Take 1 tablet (150 mg total) by mouth 2 (two) times daily. 60 tablet 3   . terconazole (TERAZOL 3) 0.8 % vaginal cream Place 1 applicator at bedtime vaginally. 20 g 0     Review of Systems  HENT: Negative.   Respiratory: Negative.   Cardiovascular: Negative.   Gastrointestinal: Negative.   Genitourinary: Negative.   Musculoskeletal: Negative.   Neurological: Positive for dizziness and headaches.   Physical Exam   Blood pressure 109/68, pulse 85, temperature 98.3 F (36.8 C), temperature source Oral, resp. rate 16, weight 99 lb (44.9 kg), last menstrual period 08/24/2017, SpO2 100 %.  Physical Exam  Constitutional: She is oriented to person, place, and time. She appears well-developed and well-nourished.  HENT:  Head:  Normocephalic.  Neck: Normal range of motion.  Respiratory: Effort normal and breath sounds normal.  GI: Soft. Bowel sounds are normal.  Musculoskeletal: Normal range of motion.  Neurological: She is alert and oriented to person, place, and time.  Skin: Skin is warm and dry.  Psychiatric: She has a normal mood and affect.    MAU Course  Procedures  MDM Patient had fioricet; HA now resolved (now just feeling a small "pulling" sensation on her head"). Patient ate some crackers and peanut butter.   Patient has many questions about what to eat, how to eat, what is safe in pregnancy. Much counseling and discussion about diet and timing of eating. Impressed upon patient the need to start iron daily , avoid dairy with iron pills, and take zofran 3 times a day. Reviewed with patient the small cardiac/cleft palate risks with Zofran; given that patient is 11 weeks 3 days, patient wants to try zofran. She will start phenergan at night.    FHR is 158 by doppler; UA negative for ketones.  Assessment and Plan   1. Other iron deficiency anemia   2. Morning sickness    3. Extensive counseling on importance of iron, taking stool softener and managing her morning sickness with medication and proper eating. Patient knows taht she may need an iron infusion if her hemoglobin drops too low; she also knows that she must take her morning sickness meds regularly in order for them to work. Patient knows she needs to eat healthy to help her headaches and dizziness go away.  4. [facility]  5. All questions answered; patient verbalized understanding.  6. Patient to return to MAU if her condition were to change or worsen.  April Wallace 11/12/2017, 1:35 PM

## 2017-11-12 NOTE — Discharge Instructions (Signed)
Eating Plan for Hyperemesis Gravidarum Hyperemesis gravidarum is a severe form of morning sickness. Because this condition causes severe nausea and vomiting, it can lead to dehydration, malnutrition, and weight loss. One way to lessen the symptoms of nausea and vomiting is to follow the eating plan for hyperemesis gravidarum. It is often used along with prescribed medicines to control your symptoms. What can I do to relieve my symptoms? Listen to your body. Everyone is different and has different preferences. Find what works best for you. Take any of the following actions that are helpful to you:  Eat and drink slowly.  Eat 5-6 small meals daily instead of 3 large meals.  Eat crackers before you get out of bed in the morning.  Try having a snack in the middle of the night.  Starchy foods are usually tolerated well. Examples include cereal, toast, bread, potatoes, pasta, rice, and pretzels.  Ginger may help with nausea. Add  tsp ground ginger to hot tea or choose ginger tea.  Try drinking 100% fruit juice or an electrolyte drink. An electrolyte drink contains sodium, potassium, and chloride.  Continue to take your prenatal vitamins as told by your health care provider. If you are having trouble taking your prenatal vitamins, talk with your health care provider about different options.  Include at least 1 serving of protein with your meals and snacks. Protein options include meats or poultry, beans, nuts, eggs, and yogurt. Try eating a protein-rich snack before bed. Examples of these snacks include cheese and crackers or half of a peanut butter or [place] sandwich.  Consider eliminating foods that trigger your symptoms. These may include spicy foods, coffee, high-fat foods, very sweet foods, and acidic foods.  Try meals that have more protein combined with bland, salty, lower-fat, and dry foods, such as nuts, seeds, pretzels, crackers, and cereal.  Talk with your healthcare provider about  starting a supplement of vitamin B6.  Have fluids that are cold, clear, and carbonated or sour. Examples include lemonade, ginger ale, lemon-lime soda, ice water, and sparkling water.  Try lemon or mint tea.  Try brushing your teeth or using a mouth rinse after meals.  What should I avoid to reduce my symptoms? Avoiding some of the following things may help reduce your symptoms.  Foods with strong smells. Try eating meals in well-ventilated areas that are free of odors.  Drinking water or other beverages with meals. Try not to drink anything during the 30 minutes before and after your meals.  Drinking more than 1 cup of fluid at a time. Sometimes using a straw helps.  Fried or high-fat foods, such as butter and cream sauces.  Spicy foods.  Skipping meals as best as you can. Nausea can be more intense on an empty stomach. If you cannot tolerate food at that time, do not force it. Try sucking on ice chips or other frozen items, and make up for missed calories later.  Lying down within 2 hours after eating.  Environmental triggers. These may include smoky rooms, closed spaces, rooms with strong smells, warm or humid places, overly loud and noisy rooms, and rooms with motion or flickering lights.  Quick and sudden changes in your movement.  This information is not intended to replace advice given to you by your health care provider. Make sure you discuss any questions you have with your health care provider. Document Released: 09/30/2007 Document Revised: 08/01/2016 Document Reviewed: 07/03/2016 Elsevier Interactive Patient Education  2018 [employer]. Eating Plan for Pregnant  Women While you are pregnant, your body will require additional nutrition to help support your growing baby. It is recommended that you consume:  150 additional calories each day during your first trimester.  300 additional calories each day during your second trimester.  300 additional calories each day  during your third trimester.  Eating a healthy, well-balanced diet is very important for your health and for your baby's health. You also have a higher need for some vitamins and minerals, such as folic acid, calcium, iron, and vitamin D. What do I need to know about eating during pregnancy?  Do not try to lose weight or go on a diet during pregnancy.  Choose healthy, nutritious foods. Choose  of a sandwich with a glass of milk instead of a candy bar or a high-calorie sugar-sweetened beverage.  Limit your overall intake of foods that have "empty calories." These are foods that have little nutritional value, such as sweets, desserts, candies, sugar-sweetened beverages, and fried foods.  Eat a variety of foods, especially fruits and vegetables.  Take a prenatal vitamin to help meet the additional needs during pregnancy, specifically for folic acid, iron, calcium, and vitamin D.  Remember to stay active. Ask your health care provider for exercise recommendations that are specific to you.  Practice good food safety and cleanliness, such as washing your hands before you eat and after you prepare raw meat. This helps to prevent foodborne illnesses, such as listeriosis, that can be very dangerous for your baby. Ask your health care provider for more information about listeriosis. What does 150 extra calories look like? Healthy options for an additional 150 calories each day could be any of the following:  Plain low-fat yogurt (6-8 oz) with  cup of berries.  1 apple with 2 teaspoons of peanut butter.  Cut-up vegetables with  cup of hummus.  Low-fat chocolate milk (8 oz or 1 cup).  1 string cheese with 1 medium orange.   of a peanut butter and jelly sandwich on whole-wheat bread (1 tsp of peanut butter).  For 300 calories, you could eat two of those healthy options each day. What is a healthy amount of weight to gain? The recommended amount of weight for you to gain is based on your  pre-pregnancy BMI. If your pre-pregnancy BMI was:  Less than 18 (underweight), you should gain 28-40 lb.  18-24.9 (normal), you should gain 25-35 lb.  25-29.9 (overweight), you should gain 15-25 lb.  Greater than 30 (obese), you should gain 11-20 lb.  What if I am having twins or multiples? Generally, pregnant women who will be having twins or multiples may need to increase their daily calories by 300-600 calories each day. The recommended range for total weight gain is 25-54 lb, depending on your pre-pregnancy BMI. Talk with your health care provider for specific guidance about additional nutritional needs, weight gain, and exercise during your pregnancy. What foods can I eat? Grains Any grains. Try to choose whole grains, such as whole-wheat bread, oatmeal, or brown rice. Vegetables Any vegetables. Try to eat a variety of colors and types of vegetables to get a full range of vitamins and minerals. Remember to wash your vegetables well before eating. Fruits Any fruits. Try to eat a variety of colors and types of fruit to get a full range of vitamins and minerals. Remember to wash your fruits well before eating. Meats and Other Protein Sources Lean meats, including chicken, [place], fish, and lean cuts of beef, veal, or pork. Make  sure that all meats are cooked to "well done." Tofu. Tempeh. Beans. Eggs. Peanut butter and other nut butters. Seafood, such as shrimp, crab, and lobster. If you choose fish, select types that are higher in omega-3 fatty acids, including salmon, herring, mussels, trout, sardines, and pollock. Make sure that all meats are cooked to food-safe temperatures. Dairy Pasteurized milk and milk alternatives. Pasteurized yogurt and pasteurized cheese. Cottage cheese. Sour cream. Beverages Water. Juices that contain 100% fruit juice or vegetable juice. Caffeine-free teas and decaffeinated coffee. Drinks that contain caffeine are okay to drink, but it is better to avoid  caffeine. Keep your total caffeine intake to less than 200 mg each day (12 oz of coffee, tea, or soda) or as directed by your health care provider. Condiments Any pasteurized condiments. Sweets and Desserts Any sweets and desserts. Fats and Oils Any fats and oils. The items listed above may not be a complete list of recommended foods or beverages. Contact your dietitian for more options. What foods are not recommended? Vegetables Unpasteurized (raw) vegetable juices. Fruits Unpasteurized (raw) fruit juices. Meats and Other Protein Sources Cured meats that have nitrates, such as bacon, salami, and hotdogs. Luncheon meats, bologna, or other deli meats (unless they are reheated until they are steaming hot). Refrigerated pate, meat spreads from a meat counter, smoked seafood that is found in the refrigerated section of a store. Raw fish, such as sushi or sashimi. High mercury content fish, such as tilefish, shark, swordfish, and king mackerel. Raw meats, such as tuna or beef tartare. Undercooked meats and poultry. Make sure that all meats are cooked to food-safe temperatures. Dairy Unpasteurized (raw) milk and any foods that have raw milk in them. Soft cheeses, such as feta, queso blanco, queso fresco, Brie, Camembert cheeses, blue-veined cheeses, and Panela cheese (unless it is made with pasteurized milk, which must be stated on the label). Beverages Alcohol. Sugar-sweetened beverages, such as sodas, teas, or energy drinks. Condiments Homemade fermented foods and drinks, such as pickles, sauerkraut, or kombucha drinks. (Store-bought pasteurized versions of these are okay.) Other Salads that are made in the store, such as ham salad, chicken salad, egg salad, tuna salad, and seafood salad. The items listed above may not be a complete list of foods and beverages to avoid. Contact your dietitian for more information. This information is not intended to replace advice given to you by your health care  provider. Make sure you discuss any questions you have with your health care provider. Document Released: 09/17/2014 Document Revised: 05/10/2016 Document Reviewed: 05/18/2014 Elsevier Interactive Patient Education  [employer]2018 [employer]

## 2017-11-12 NOTE — MAU Note (Signed)
Been having real bad headaches, been feeling dizzy like she is about to faint. ( gait steady, did require assistance when called to triage).  Took Tylenol for HA, no  Relief.

## 2017-11-12 NOTE — Progress Notes (Signed)
Pt denies pain but states she has a "pulling sensation" on the R side, parietal.

## 2017-11-19 ENCOUNTER — Inpatient Hospital Stay (HOSPITAL_COMMUNITY)
Admission: AD | Admit: 2017-11-19 | Discharge: 2017-11-19 | Disposition: A | Discharge status: Home / Self Care | Payer: Medicaid Other | Source: Ambulatory Visit | Attending: Obstetrics and Gynecology | Admitting: Obstetrics and Gynecology

## 2017-11-19 ENCOUNTER — Encounter (HOSPITAL_COMMUNITY): Payer: Self-pay

## 2017-11-19 ENCOUNTER — Other Ambulatory Visit: Payer: Self-pay

## 2017-11-19 DIAGNOSIS — Z3A12 12 weeks gestation of pregnancy: Secondary | ICD-10-CM | POA: Insufficient documentation

## 2017-11-19 DIAGNOSIS — R519 Headache, unspecified: Secondary | ICD-10-CM

## 2017-11-19 DIAGNOSIS — O210 Mild hyperemesis gravidarum: Secondary | ICD-10-CM | POA: Insufficient documentation

## 2017-11-19 DIAGNOSIS — O26891 Other specified pregnancy related conditions, first trimester: Secondary | ICD-10-CM

## 2017-11-19 DIAGNOSIS — R109 Unspecified abdominal pain: Secondary | ICD-10-CM

## 2017-11-19 DIAGNOSIS — R51 Headache: Secondary | ICD-10-CM | POA: Diagnosis present

## 2017-11-19 DIAGNOSIS — O26899 Other specified pregnancy related conditions, unspecified trimester: Secondary | ICD-10-CM

## 2017-11-19 LAB — URINALYSIS, ROUTINE W REFLEX MICROSCOPIC
Bilirubin Urine: NEGATIVE
GLUCOSE, UA: NEGATIVE mg/dL
HGB URINE DIPSTICK: NEGATIVE
KETONES UR: NEGATIVE mg/dL
NITRITE: NEGATIVE
PROTEIN: NEGATIVE mg/dL
Specific Gravity, Urine: 1.017 (ref 1.005–1.030)
pH: 7 (ref 5.0–8.0)

## 2017-11-19 MED ORDER — BUTALBITAL-APAP-CAFFEINE 50-325-40 MG PO TABS: 1.0000 | ORAL_TABLET | Freq: Four times a day (QID) | ORAL | 0 refills | Status: AC | PRN

## 2017-11-19 MED ORDER — ONDANSETRON 4 MG PO TBDP: 4.0000 mg | ORAL_TABLET | Freq: Three times a day (TID) | ORAL | 0 refills | Status: DC | PRN

## 2017-11-19 MED ORDER — BUTALBITAL-APAP-CAFFEINE 50-325-40 MG PO TABS
1.0000 | ORAL_TABLET | Freq: Once | ORAL | Status: AC
  Administered 2017-11-19: 1 via ORAL
  Filled 2017-11-19: qty 1

## 2017-11-19 NOTE — MAU Note (Signed)
Pt states that she has had a constant headache for the last 2 weeks. She describes it as a pulling and throbbing sensation. She is rating the pain constant 8/10.

## 2017-11-19 NOTE — MAU Provider Note (Signed)
Chief Complaint: Headache   SUBJECTIVE HPI: April Wallace is a 21 y.o. G2P0010 at [id number] who presents to MAU with complaints of headache.  Patient states she is she has been having headaches for 2 weeks that are becoming worse.  She describes the headaches as throbbing and pulling sensation.  She had headaches prior to pregnancy but not as bad.  She rates her headache 8 out of 10.  She has tried Tylenol without relief.  Prior to pregnancy ibuprofen would help with headaches.  Patient states that with headaches sometimes she gets vision changes described as blurry vision.  She denies dizziness, weakness, falls.  Patient also endorsing continued nausea vomiting.  States that she has 4-5 episodes of emesis a day.  Patient has Rx for Phenergan, Reglan, and Zofran.  States that Zofran helps but sometimes she spits up the pill.  Unable to keep a lot of fluids down. Has had multiple MAU visits for this.   Denies abnormal vaginal discharge, bleeding, dysuria, abdominal pain, fever.   Past Medical History:  Diagnosis Date  . Headache    OB History  Gravida Para Term Preterm AB Living  2       1    SAB TAB Ectopic Multiple Live Births               # Outcome Date GA Lbr Len/2nd Weight Sex Delivery Anes PTL Lv  2 Current           1 AB 03/27/17             Past Surgical History:  Procedure Laterality Date  . WISDOM TOOTH EXTRACTION     Social History   Socioeconomic History  . Marital status: Single    Spouse name: Not on file  . Number of children: Not on file  . Years of education: Not on file  . Highest education level: Not on file  Social Needs  . Financial resource strain: Not on file  . Food insecurity - worry: Not on file  . Food insecurity - inability: Not on file  . Transportation needs - medical: Not on file  . Transportation needs - non-medical: Not on file  Occupational History  . Not on file  Tobacco Use  . Smoking status: Never Smoker  . Smokeless tobacco:  Never Used  Substance and Sexual Activity  . Alcohol use: No  . Drug use: No  . Sexual activity: Not Currently    Birth control/protection: None  Other Topics Concern  . Not on file  Social History Narrative  . Not on file   No current facility-administered medications on file prior to encounter.    Current Outpatient Medications on File Prior to Encounter  Medication Sig Dispense Refill  . acetaminophen (TYLENOL) 325 MG tablet Take 650 mg by mouth every 6 (six) hours as needed for headache.    . ferrous sulfate 325 (65 FE) MG tablet Take 1 tablet (325 mg total) by mouth daily with breakfast. Take iron with juice, not milk or any dairy 30 tablet 8  . ondansetron (ZOFRAN) 4 MG tablet Take 1 tablet (4 mg total) by mouth every 8 (eight) hours as needed for nausea or vomiting. 20 tablet 0  . Prenatal Multivit-Min-Fe-FA (PRENATAL VITAMINS) 0.8 MG tablet Take 1 tablet by mouth daily. (Patient not taking: Reported on 11/12/2017) 30 tablet 12  . promethazine (PHENERGAN) 12.5 MG tablet Take 1 tablet (12.5 mg total) by mouth every 6 (six) hours as needed for  nausea or vomiting. 30 tablet 1  . ranitidine (ZANTAC) 150 MG tablet Take 1 tablet (150 mg total) by mouth 2 (two) times daily. 60 tablet 3   No Known Allergies  I have reviewed the past Medical Hx, Surgical Hx, Social Hx, Allergies and Medications.   REVIEW OF SYSTEMS All systems reviewed and are negative for acute change except as noted in the HPI.   OBJECTIVE BP (!) 99/58 (BP Location: Left Arm)   Pulse 76   Temp 98.7 F (37.1 C)   Resp 20   Ht 5\' 4"  (1.626 m)   LMP 08/24/2017 (Exact Date)   BMI 16.99 kg/m   PHYSICAL EXAM Constitutional: Well-developed, well-nourished female in no acute distress. Head: NCAT O/P: clear with tacky mucous membranes, no erythema   Cardiovascular: normal rate and rhythm, pulses intact Respiratory: normal rate and effort.  GI: Abd soft, non-distended, epigastric tendernss MS: Extremities  nontender, no edema, normal ROM Neurologic: Alert and oriented x 4. No focal deficits. CNs grossly intact Psych: normal mood and affect  LAB RESULTS Results for orders placed or performed during the hospital encounter of 11/19/17 (from the past 24 hour(s))  Urinalysis, Routine w reflex microscopic     Status: Abnormal   Collection Time: 11/19/17  8:04 PM  Result Value Ref Range   Color, Urine YELLOW YELLOW   APPearance HAZY (A) CLEAR   Specific Gravity, Urine 1.017 1.005 - 1.030   pH 7.0 5.0 - 8.0   Glucose, UA NEGATIVE NEGATIVE mg/dL   Hgb urine dipstick NEGATIVE NEGATIVE   Bilirubin Urine NEGATIVE NEGATIVE   Ketones, ur NEGATIVE NEGATIVE mg/dL   Protein, ur NEGATIVE NEGATIVE mg/dL   Nitrite NEGATIVE NEGATIVE   Leukocytes, UA MODERATE (A) NEGATIVE   RBC / HPF 0-5 0 - 5 RBC/hpf   WBC, UA 0-5 0 - 5 WBC/hpf   Bacteria, UA RARE (A) NONE SEEN   Squamous Epithelial / LPF 6-30 (A) NONE SEEN   Mucus PRESENT     IMAGING No results found.  MAU COURSE Vitals and nursing notes reviewed UA unremarkable Doppler FHR 154  Treatments given in MAU: Fioricet with improvement in HA  MDM Plan of care reviewed with patient, including labs and tests ordered and medical treatment.   ASSESSMENT 1. Nonintractable episodic headache, unspecified headache type   2. Abdominal cramping affecting pregnancy   3. Morning sickness     PLAN Discharge home in stable condition Rx given for Fioricet for headaches Changed Zofran to ODT Rx for Zantac to help with epigastric discomfort from emesis Counseled on return precautions Handout given Follow-up with OB provider   Caryl AdaJazma Adryana Mogensen, DO [facility], [facility] [place] 11/19/2017, 8:12 PM

## 2017-11-19 NOTE — Discharge Instructions (Signed)
-  Take Zantac, Iron, prenatal vitamins, and nausea medications -Medication given for headache   Tension Headache A tension headache is pain, pressure, or aching that is felt over the front and sides of your head. These headaches can last from 30 minutes to several days. Follow these instructions at home: Managing pain  Take over-the-counter and prescription medicines only as told by your doctor.  Lie down in a dark, quiet room when you have a headache.  If directed, apply ice to your head and neck area: ? Put ice in a plastic bag. ? Place a towel between your skin and the bag. ? Leave the ice on for 20 minutes, 2-3 times per day.  Use a heating pad or a hot shower to apply heat to your head and neck area as told by your doctor. Eating and drinking  Eat meals on a regular schedule.  Do not drink a lot of alcohol.  Do not use a lot of caffeine, or stop using caffeine. General instructions  Keep all follow-up visits as told by your doctor. This is important.  Keep a journal to find out if certain things bring on headaches. For example, write down: ? What you eat and drink. ? How much sleep you get. ? Any change to your diet or medicines.  Try getting a massage, or doing other things that help you to relax.  Lessen stress.  Sit up straight. Do not tighten (tense) your muscles.  Do not use tobacco products. This includes cigarettes, chewing tobacco, or e-cigarettes. If you need help quitting, ask your doctor.  Exercise regularly as told by your doctor.  Get enough sleep. This may mean 7-9 hours of sleep. Contact a doctor if:  Your symptoms are not helped by medicine.  You have a headache that feels different from your usual headache.  You feel sick to your stomach (nauseous) or you throw up (vomit).  You have a fever. Get help right away if:  Your headache becomes very bad.  You keep throwing up.  You have a stiff neck.  You have trouble seeing.  You have  trouble speaking.  You have pain in your eye or ear.  Your muscles are weak or you lose muscle control.  You lose your balance or you have trouble walking.  You feel like you will pass out (faint) or you pass out.  You have confusion. This information is not intended to replace advice given to you by your health care provider. Make sure you discuss any questions you have with your health care provider. Document Released: 02/27/2010 Document Revised: 08/02/2016 Document Reviewed: 03/28/2015 Elsevier Interactive Patient Education  [employer]2018 [employer]

## 2017-11-25 ENCOUNTER — Encounter: Payer: Medicaid Other | Admitting: Certified Nurse Midwife

## 2018-03-29 ENCOUNTER — Emergency Department (HOSPITAL_COMMUNITY)
Admission: EM | Admit: 2018-03-29 | Discharge: 2018-03-29 | Disposition: A | Discharge status: Home / Self Care | Payer: Medicaid Other | Attending: Emergency Medicine | Admitting: Emergency Medicine

## 2018-03-29 ENCOUNTER — Encounter (HOSPITAL_COMMUNITY): Payer: Self-pay | Admitting: Emergency Medicine

## 2018-03-29 DIAGNOSIS — N939 Abnormal uterine and vaginal bleeding, unspecified: Secondary | ICD-10-CM | POA: Insufficient documentation

## 2018-03-29 DIAGNOSIS — Z79899 Other long term (current) drug therapy: Secondary | ICD-10-CM | POA: Insufficient documentation

## 2018-03-29 DIAGNOSIS — N760 Acute vaginitis: Secondary | ICD-10-CM

## 2018-03-29 DIAGNOSIS — B9689 Other specified bacterial agents as the cause of diseases classified elsewhere: Secondary | ICD-10-CM

## 2018-03-29 LAB — URINALYSIS, ROUTINE W REFLEX MICROSCOPIC
BACTERIA UA: NONE SEEN
BILIRUBIN URINE: NEGATIVE
Glucose, UA: NEGATIVE mg/dL
KETONES UR: NEGATIVE mg/dL
LEUKOCYTES UA: NEGATIVE
NITRITE: NEGATIVE
PH: 6 (ref 5.0–8.0)
Protein, ur: NEGATIVE mg/dL
SPECIFIC GRAVITY, URINE: 1.018 (ref 1.005–1.030)

## 2018-03-29 LAB — WET PREP, GENITAL
SPERM: NONE SEEN
TRICH WET PREP: NONE SEEN
Yeast Wet Prep HPF POC: NONE SEEN

## 2018-03-29 LAB — POC URINE PREG, ED: PREG TEST UR: NEGATIVE

## 2018-03-29 MED ORDER — KETOCONAZOLE 2 % EX CREA: 1.0000 "application " | TOPICAL_CREAM | Freq: Every day | CUTANEOUS | 0 refills | Status: DC

## 2018-03-29 MED ORDER — DESONIDE 0.05 % EX OINT: 1.0000 "application " | TOPICAL_OINTMENT | Freq: Two times a day (BID) | CUTANEOUS | 2 refills | Status: DC

## 2018-03-29 MED ORDER — METRONIDAZOLE 500 MG PO TABS: 500.0000 mg | ORAL_TABLET | Freq: Two times a day (BID) | ORAL | 0 refills | Status: DC

## 2018-03-29 NOTE — ED Provider Notes (Signed)
[facility]-EMERGENCY DEPT Provider Note   CSN: [id number] Arrival date & time: 03/29/18  1410     History   Chief Complaint Chief Complaint  Patient presents with  . Vaginal Itching  . Vaginal Bleeding  . Rash    HPI April Wallace is a 22 y.o. female who presents to the ED with cc of vaginal itching and bleeding. The patient was on Depomedrol, but her Medrol wore off last month.  Over the past 3 weeks she has had persistent bleeding, abdominal cramping and saturating about 2 pads a day.  She has had some itching and irritation of the perineum and vulva but denies vaginal discharge, abdominal pain, urinary symptoms.  Last time she had intercourse was in December 2018.  Patient also noticed some bumps on her fingers.  She states that she gets these once or twice a year.  They itch.  They do not spread.  She denies heat redness swelling or fever. HPI  Past Medical History:  Diagnosis Date  . Headache     Patient Active Problem List   Diagnosis Date Noted  . Anemia affecting pregnancy in first trimester 11/12/2017  . Bacterial vaginitis 10/30/2017  . Candida vaginitis 10/30/2017  . Abdominal cramping affecting pregnancy 10/30/2017  . Morning sickness 10/16/2017    Past Surgical History:  Procedure Laterality Date  . WISDOM TOOTH EXTRACTION       OB History    Gravida  2   Para      Term      Preterm      AB  1   Living        SAB      TAB      Ectopic      Multiple      Live Births               Home Medications    Prior to Admission medications   Medication Sig Start Date End Date Taking? Authorizing Provider  acetaminophen (TYLENOL) 325 MG tablet Take 650 mg by mouth every 6 (six) hours as needed for headache.    [provider]  butalbital-acetaminophen-caffeine (FIORICET, ESGIC) 50-325-40 MG tablet Take 1 tablet by mouth every 6 (six) hours as needed for headache. 11/19/17 11/19/18  Pincus Large, DO    ferrous sulfate 325 (65 FE) MG tablet Take 1 tablet (325 mg total) by mouth daily with breakfast. Take iron with juice, not milk or any dairy 11/12/17   Kooistra, Charlesetta Garibaldi, CNM  ondansetron (ZOFRAN-ODT) 4 MG disintegrating tablet Take 1 tablet (4 mg total) by mouth every 8 (eight) hours as needed for nausea or vomiting. 11/19/17   Pincus Large, DO  Prenatal Multivit-Min-Fe-FA (PRENATAL VITAMINS) 0.8 MG tablet Take 1 tablet by mouth daily. Patient not taking: Reported on 11/12/2017 10/05/17   Aviva Signs, CNM  promethazine (PHENERGAN) 12.5 MG tablet Take 1 tablet (12.5 mg total) by mouth every 6 (six) hours as needed for nausea or vomiting. Patient not taking: Reported on 11/19/2017 10/17/17   Raelyn Mora, CNM  ranitidine (ZANTAC) 150 MG tablet Take 1 tablet (150 mg total) by mouth 2 (two) times daily. Patient not taking: Reported on 11/19/2017 10/17/17 10/17/18  Raelyn Mora, CNM    Family History Family History  Problem Relation Age of Onset  . Diabetes Maternal Grandmother   . Seizures Maternal Grandmother   . Asthma Maternal Grandmother   . Cancer Maternal Grandfather     Social  History Social History   Tobacco Use  . Smoking status: Never Smoker  . Smokeless tobacco: Never Used  Substance Use Topics  . Alcohol use: No  . Drug use: No     Allergies   Patient has no known allergies.   Review of Systems Review of Systems  Ten systems reviewed and are negative for acute change, except as noted in the HPI.   Physical Exam Updated Vital Signs BP 123/73   Pulse 89   Temp 98.6 F (37 C)   Resp 14   LMP 08/24/2017 (Exact Date)   SpO2 97%   Physical Exam  Constitutional: She is oriented to person, place, and time. She appears well-developed and well-nourished. No distress.  HENT:  Head: Normocephalic and atraumatic.  Eyes: Conjunctivae are normal. No scleral icterus.  Neck: Normal range of motion.  Cardiovascular: Normal rate, regular rhythm and  normal heart sounds. Exam reveals no gallop and no friction rub.  No murmur heard. Pulmonary/Chest: Effort normal and breath sounds normal. No respiratory distress.  Abdominal: Soft. Bowel sounds are normal. She exhibits no distension and no mass. There is no tenderness. There is no guarding.  Genitourinary:  Genitourinary Comments: Pelvic exam: VULVA: vulvar tenderness, vulvar erythema , vulvar hypopigmentation,xerosis, and skin breakdown on, VAGINA: normal appearing vagina with normal color and discharge, no lesions, CERVIX: normal appearing cervix without lesions or CMT; bleeding from the OS consistent with menstrual flo, ADNEXA: normal adnexa in size, nontender and no masses, exam chaperoned by KAT.   Neurological: She is alert and oriented to person, place, and time.  Skin: Skin is warm and dry. She is not diaphoretic.  Small singular Bumps on the lateral finger borders  Psychiatric: Her behavior is normal.  Nursing note and vitals reviewed.    ED Treatments / Results  Labs (all labs ordered are listed, but only abnormal results are displayed) Labs Reviewed  URINALYSIS, ROUTINE W REFLEX MICROSCOPIC - Abnormal; Notable for the following components:      Result Value   Hgb urine dipstick LARGE (*)    Squamous Epithelial / LPF 0-5 (*)    All other components within normal limits  WET PREP, GENITAL  HIV ANTIBODY (ROUTINE TESTING)  RPR  POC URINE PREG, ED  GC[facility]LAM[facility]DIA PROBE AMP () [facility]    EKG None  Radiology No results found.  Procedures Procedures (including critical care time)  Medications Ordered in ED Medications - No data to display   Initial Impression / Assessment and Plan / ED Course  I have reviewed the triage vital signs and the nursing notes.  Pertinent labs & imaging results that were available during my care of the patient were reviewed by me and considered in my medical decision making (see chart for details).     Patient with  abnormal uterine bleeding, vulvar vaginitis and dyshidrotic dermatitis of the hands.  Patient be discharged with antifungal, corticosteroid for the hand and to follow-up outpatient with gynecology for dysfunctional bleeding.  She is hemodynamically stable and appears appropriate for discharge at this time.  She also has BV and will be treated with Flagyl orally.  Final Clinical Impressions(s) / ED Diagnoses   Final diagnoses:  Abnormal uterine bleeding (AUB)  BV (bacterial vaginosis)  Vulvovaginitis    ED Discharge Orders    None       Arthor CaptainHarris, Kaianna Dolezal, PA-C 03/30/18 0016    Rolland PorterJames, Mark, MD 04/01/18 1452

## 2018-03-29 NOTE — Discharge Instructions (Addendum)
Contact a health care provider if: °Your bleeding lasts more than 1 week. °You feel dizzy at times. °Get help right away if: °You pass out. °You are changing pads every 15 to 30 minutes. °You have abdominal pain. °You have a fever. °You become sweaty or weak. °You are passing large blood clots from the vagina. °You start to feel nauseous and vomit. °

## 2018-03-29 NOTE — ED Triage Notes (Signed)
Patient here from home with complaints of vaginal bleeding x3 weeks and vaginal irritation/itching. Also report rash on two fingers to right hand. Denies discharge, denies allergy.

## 2018-03-30 LAB — HIV ANTIBODY (ROUTINE TESTING W REFLEX): HIV SCREEN 4TH GENERATION: NONREACTIVE

## 2018-03-30 LAB — RPR: RPR: NONREACTIVE

## 2018-03-31 LAB — GC/CHLAMYDIA PROBE AMP (~~LOC~~) NOT AT ARMC
CHLAMYDIA, DNA PROBE: NEGATIVE
NEISSERIA GONORRHEA: NEGATIVE

## 2018-05-21 ENCOUNTER — Other Ambulatory Visit: Payer: Self-pay

## 2018-05-21 ENCOUNTER — Emergency Department (HOSPITAL_BASED_OUTPATIENT_CLINIC_OR_DEPARTMENT_OTHER)
Admission: EM | Admit: 2018-05-21 | Discharge: 2018-05-21 | Disposition: A | Discharge status: Home / Self Care | Payer: Medicaid Other | Attending: Emergency Medicine | Admitting: Emergency Medicine

## 2018-05-21 ENCOUNTER — Encounter (HOSPITAL_BASED_OUTPATIENT_CLINIC_OR_DEPARTMENT_OTHER): Payer: Self-pay

## 2018-05-21 DIAGNOSIS — Z79899 Other long term (current) drug therapy: Secondary | ICD-10-CM | POA: Insufficient documentation

## 2018-05-21 DIAGNOSIS — R1030 Lower abdominal pain, unspecified: Secondary | ICD-10-CM

## 2018-05-21 LAB — URINALYSIS, ROUTINE W REFLEX MICROSCOPIC
Bilirubin Urine: NEGATIVE
Glucose, UA: NEGATIVE mg/dL
HGB URINE DIPSTICK: NEGATIVE
Ketones, ur: NEGATIVE mg/dL
NITRITE: NEGATIVE
Protein, ur: NEGATIVE mg/dL
pH: 6 (ref 5.0–8.0)

## 2018-05-21 LAB — WET PREP, GENITAL
Clue Cells Wet Prep HPF POC: NONE SEEN
Sperm: NONE SEEN
Trich, Wet Prep: NONE SEEN
Yeast Wet Prep HPF POC: NONE SEEN

## 2018-05-21 LAB — URINALYSIS, MICROSCOPIC (REFLEX): RBC / HPF: NONE SEEN RBC/hpf (ref 0–5)

## 2018-05-21 LAB — PREGNANCY, URINE: PREG TEST UR: NEGATIVE

## 2018-05-21 MED ORDER — IBUPROFEN 800 MG PO TABS: 800.0000 mg | ORAL_TABLET | Freq: Three times a day (TID) | ORAL | 0 refills | Status: DC | PRN

## 2018-05-21 NOTE — Discharge Instructions (Addendum)
You were seen in the emergency department for lower abdominal pain. Your urine pregnancy test was negative. Your urine did show that you were a bit dehydrated, be sure to drink plenty of water. We have tested your for gonorrhea/chlamydia- we weill call you with these results- if positive you will need to seek treatment and inform all sexual partners.   At this time we suspect it is possible that your pain is related to hormones/menstrual cycle. Please take ibuprofen 800 mg every 8 hours as needed for pain- take this with food as it can cause stomach upset and and worst stomach bleeding. Do not take other NSAIDs with this. We have prescribed you new medication(s) today. Discuss the medications prescribed today with your pharmacist as they can have adverse effects and interactions with your other medicines including over the counter and prescribed medications. Seek medical evaluation if you start to experience new or abnormal symptoms after taking one of these medicines, seek care immediately if you start to experience difficulty breathing, feeling of your throat closing, facial swelling, or rash as these could be indications of a more serious allergic reaction   Follow up with our women's health clinic in the next 3-5 days for re-evaluation. Return to the ER for new or worsening symptoms including but not limited to worsened pain, fever, vomiting, inability to keep fluids down, problems with your bowel movements or urination, or any other concerns.

## 2018-05-21 NOTE — ED Notes (Signed)
NAD at this time. Pt is stable and going home.  

## 2018-05-21 NOTE — ED Provider Notes (Signed)
[facility] EMERGENCY DEPARTMENT Provider Note   CSN: [id number] Arrival date & time: 05/21/18  1417     History   Chief Complaint Chief Complaint  Patient presents with  . Abdominal Pain    HPI April Wallace is a 22 y.o. female G2P0A2 who presents to the ED with complaints of abdominal discomfort x 3 days. Patient describes the pain as being to the lower abdomen. She states it is intermittent, it does not come on suddenly. Describes it as a cramping/sharp sensation. No specific alleviating/aggravating factors. She has had some associated nausea without vomiting. She states this feels similar to previous cramping related to her menstrual cycle, but current sxs are less severe. She states her LMP was 05/07-05/12, however she had another 1 week of bleeding starting 05/17, none since. She states she is currently sexually active.  Patient denies concern for STDs, states she has full testing including GC/chlamydia, HIV, and syphilis in April, no new sexual partners since, tests were negative. She denies fever, chills, vomiting, diarrhea, dysuria, urinary frequency/urgency, or vaginal discharge.  HPI  Past Medical History:  Diagnosis Date  . Headache     Patient Active Problem List   Diagnosis Date Noted  . Anemia affecting pregnancy in first trimester 11/12/2017  . Bacterial vaginitis 10/30/2017  . Candida vaginitis 10/30/2017  . Abdominal cramping affecting pregnancy 10/30/2017  . Morning sickness 10/16/2017    Past Surgical History:  Procedure Laterality Date  . INDUCED ABORTION    . WISDOM TOOTH EXTRACTION       OB History    Gravida  3   Para      Term      Preterm      AB  2   Living        SAB  1   TAB      Ectopic      Multiple      Live Births               Home Medications    Prior to Admission medications   Medication Sig Start Date End Date Taking? Authorizing Provider  acetaminophen (TYLENOL) 325 MG tablet Take 650 mg  by mouth every 6 (six) hours as needed for headache.    [provider]  butalbital-acetaminophen-caffeine (FIORICET, ESGIC) 50-325-40 MG tablet Take 1 tablet by mouth every 6 (six) hours as needed for headache. 11/19/17 11/19/18  Pincus LargePhelps, Jazma Y, DO  desonide (DESOWEN) 0.05 % ointment Apply 1 application topically 2 (two) times daily. Apply to the hands 03/29/18   Arthor CaptainHarris, Abigail, PA-C  ferrous sulfate 325 (65 FE) MG tablet Take 1 tablet (325 mg total) by mouth daily with breakfast. Take iron with juice, not milk or any dairy 11/12/17   Kooistra, Charlesetta GaribaldiKathryn Lorraine, CNM  ketoconazole (NIZORAL) 2 % cream Apply 1 application topically daily. Apply to the vaginal region. 03/29/18   Harris, Cammy CopaAbigail, PA-C  metroNIDAZOLE (FLAGYL) 500 MG tablet Take 1 tablet (500 mg total) by mouth 2 (two) times daily. One po bid x 7 days 03/29/18   Arthor CaptainHarris, Abigail, PA-C  ondansetron (ZOFRAN-ODT) 4 MG disintegrating tablet Take 1 tablet (4 mg total) by mouth every 8 (eight) hours as needed for nausea or vomiting. 11/19/17   Pincus LargePhelps, Jazma Y, DO  Prenatal Multivit-Min-Fe-FA (PRENATAL VITAMINS) 0.8 MG tablet Take 1 tablet by mouth daily. Patient not taking: Reported on 11/12/2017 10/05/17   Aviva SignsWilliams, Marie L, CNM  promethazine (PHENERGAN) 12.5 MG tablet Take 1 tablet (12.5  mg total) by mouth every 6 (six) hours as needed for nausea or vomiting. Patient not taking: Reported on 11/19/2017 10/17/17   Raelyn Mora, CNM  ranitidine (ZANTAC) 150 MG tablet Take 1 tablet (150 mg total) by mouth 2 (two) times daily. Patient not taking: Reported on 11/19/2017 10/17/17 10/17/18  Raelyn Mora, CNM    Family History Family History  Problem Relation Age of Onset  . Diabetes Maternal Grandmother   . Seizures Maternal Grandmother   . Asthma Maternal Grandmother   . Cancer Maternal Grandfather     Social History Social History   Tobacco Use  . Smoking status: Never Smoker  . Smokeless tobacco: Never Used  Substance Use Topics    . Alcohol use: No  . Drug use: No     Allergies   Patient has no known allergies.  Review of Systems Review of Systems  Constitutional: Negative for chills and fever.  Respiratory: Negative for shortness of breath.   Cardiovascular: Negative for chest pain.  Gastrointestinal: Positive for abdominal pain and nausea. Negative for blood in stool, constipation, diarrhea and vomiting.  Genitourinary: Negative for decreased urine volume, dysuria, flank pain, hematuria, urgency, vaginal bleeding and vaginal discharge.  All other systems reviewed and are negative.    Physical Exam Updated Vital Signs BP 114/66 (BP Location: Right Arm)   Pulse 89   Temp 100 F (37.8 C) (Oral)   Resp 18   Ht 5\' 4"  (1.626 m)   Wt 45.5 kg (100 lb 3.2 oz)   LMP  (LMP Unknown)   SpO2 100%   Breastfeeding? Unknown   BMI 17.20 kg/m   Physical Exam  Constitutional: She appears well-developed and well-nourished. No distress.  HENT:  Head: Normocephalic and atraumatic.  Eyes: Conjunctivae are normal. Right eye exhibits no discharge. Left eye exhibits no discharge.  Cardiovascular: Normal rate and regular rhythm.  No murmur heard. Pulmonary/Chest: Breath sounds normal. No respiratory distress. She has no wheezes. She has no rales.  Abdominal: Soft. She exhibits no distension. There is tenderness (mild lower abdomen/suprapubic region). There is no rigidity, no rebound, no guarding, no CVA tenderness, no tenderness at McBurney's point and negative Murphy's sign.  Genitourinary: Pelvic exam was performed with patient supine. There is no rash or lesion on the right labia. There is no rash or lesion on the left labia. Cervix exhibits no motion tenderness and no friability. Right adnexum displays no mass, no tenderness and no fullness. Left adnexum displays no mass, no tenderness and no fullness. No erythema, tenderness or bleeding in the vagina. No foreign body in the vagina. No signs of injury around the vagina.  Vaginal discharge (minimal, white) found.  Genitourinary Comments: RN Bartholomew Crews present as chaperone  Neurological: She is alert.  Clear speech.   Skin: Skin is warm and dry. No rash noted.  Psychiatric: She has a normal mood and affect. Her behavior is normal.  Nursing note and vitals reviewed.   ED Treatments / Results  Labs Results for orders placed or performed during the hospital encounter of 05/21/18  Wet prep, genital  Result Value Ref Range   Yeast Wet Prep HPF POC NONE SEEN NONE SEEN   Trich, Wet Prep NONE SEEN NONE SEEN   Clue Cells Wet Prep HPF POC NONE SEEN NONE SEEN   WBC, Wet Prep HPF POC MANY (A) NONE SEEN   Sperm NONE SEEN   Urinalysis, Routine w reflex microscopic  Result Value Ref Range   Color, Urine YELLOW YELLOW  APPearance HAZY (A) CLEAR   Specific Gravity, Urine >1.030 (H) 1.005 - 1.030   pH 6.0 5.0 - 8.0   Glucose, UA NEGATIVE NEGATIVE mg/dL   Hgb urine dipstick NEGATIVE NEGATIVE   Bilirubin Urine NEGATIVE NEGATIVE   Ketones, ur NEGATIVE NEGATIVE mg/dL   Protein, ur NEGATIVE NEGATIVE mg/dL   Nitrite NEGATIVE NEGATIVE   Leukocytes, UA TRACE (A) NEGATIVE  Pregnancy, urine  Result Value Ref Range   Preg Test, Ur NEGATIVE NEGATIVE  Urinalysis, Microscopic (reflex)  Result Value Ref Range   RBC / HPF NONE SEEN 0 - 5 RBC/hpf   WBC, UA 0-5 0 - 5 WBC/hpf   Bacteria, UA MANY (A) NONE SEEN   Squamous Epithelial / LPF 6-10 0 - 5   Mucus PRESENT    No results found.  EKG None  Radiology No results found.  Procedures Procedures (including critical care time)  Medications Ordered in ED Medications - No data to display   Initial Impression / Assessment and Plan / ED Course  I have reviewed the triage vital signs and the nursing notes.  Pertinent labs & imaging results that were available during my care of the patient were reviewed by me and considered in my medical decision making (see chart for details).  Patient presents to the ED with  complaint of intermittent lower/suprapubic abdominal pain x 3 days. Patient is nontoxic appearing, in no apparent distress, vitals WNL. On exam patient has mild lower abdominal/suprpaubic tenderness to palpation, non focal, no peritoneal signs. Pelvic exam fairly unremarkable. At this time given presentation doubt appendicitis, bowel obstruction, bowel perforation, cholecystitis, diverticulitis, or ovarian torsion.  Labs notable for bacteriuria, UA is contaminated, patient is not having urinary sxs, do not feel treatment for UTI is necessary- patient states she does not feel at all similar to previous UTIs,  in agreement for no tx. Her urine pregnancy test is negative, doubt ectopic pregnancy. Her wet prep has WBCs, no clue cells to indicate BV, no trich identified, no yeast. Offered STD prophylaxis, patient declined, I feel this is reasonable.  She had STD testing which was negative 1.5 months ago, has not had any new sexual partners, in combinations with this history and exam doubt PID. Suspect this is likely due to menstrual cycle given patient reports similar sxs with her periods, just less severe at this time. Will treat with ibuprofen and women's/PCP follow up. I discussed results, treatment plan, need for follow-up, and return precautions with the patient. Provided opportunity for questions, patient confirmed understanding and is in agreement with plan.   Findings and plan of care discussed with supervising physician Dr.Plunkett who is in agreement with plan.    Final Clinical Impressions(s) / ED Diagnoses   Final diagnoses:  Lower abdominal pain    ED Discharge Orders        Ordered    ibuprofen (ADVIL,MOTRIN) 800 MG tablet  Every 8 hours PRN     05/21/18 1649       Sopheap Basic, [place] R, PA-C 05/21/18 1717    Gwyneth Sprout, MD 05/21/18 2340

## 2018-05-21 NOTE — ED Triage Notes (Addendum)
C/o abd pain x 3 days-unsure of LMP-states she had neg home preg test-NAD-steady gait

## 2018-05-22 LAB — GC/CHLAMYDIA PROBE AMP (~~LOC~~) NOT AT ARMC
CHLAMYDIA, DNA PROBE: NEGATIVE
Neisseria Gonorrhea: NEGATIVE

## 2018-10-10 ENCOUNTER — Emergency Department (HOSPITAL_BASED_OUTPATIENT_CLINIC_OR_DEPARTMENT_OTHER)
Admission: EM | Admit: 2018-10-10 | Discharge: 2018-10-10 | Disposition: A | Discharge status: Home / Self Care | Payer: Self-pay | Attending: Emergency Medicine | Admitting: Emergency Medicine

## 2018-10-10 ENCOUNTER — Other Ambulatory Visit: Payer: Self-pay

## 2018-10-10 ENCOUNTER — Encounter (HOSPITAL_BASED_OUTPATIENT_CLINIC_OR_DEPARTMENT_OTHER): Payer: Self-pay

## 2018-10-10 DIAGNOSIS — Z79899 Other long term (current) drug therapy: Secondary | ICD-10-CM | POA: Insufficient documentation

## 2018-10-10 DIAGNOSIS — N760 Acute vaginitis: Secondary | ICD-10-CM | POA: Insufficient documentation

## 2018-10-10 DIAGNOSIS — B9689 Other specified bacterial agents as the cause of diseases classified elsewhere: Secondary | ICD-10-CM

## 2018-10-10 LAB — WET PREP, GENITAL
Sperm: NONE SEEN
TRICH WET PREP: NONE SEEN
YEAST WET PREP: NONE SEEN

## 2018-10-10 LAB — URINALYSIS, ROUTINE W REFLEX MICROSCOPIC
Bilirubin Urine: NEGATIVE
Glucose, UA: NEGATIVE mg/dL
Hgb urine dipstick: NEGATIVE
KETONES UR: NEGATIVE mg/dL
LEUKOCYTES UA: NEGATIVE
NITRITE: NEGATIVE
PH: 6 (ref 5.0–8.0)
Protein, ur: NEGATIVE mg/dL
Specific Gravity, Urine: >1.03 — ABNORMAL HIGH (ref 1.005–1.030)

## 2018-10-10 LAB — PREGNANCY, URINE: Preg Test, Ur: NEGATIVE

## 2018-10-10 MED ORDER — METRONIDAZOLE 500 MG PO TABS: 500.0000 mg | ORAL_TABLET | Freq: Two times a day (BID) | ORAL | 0 refills | Status: DC

## 2018-10-10 NOTE — Discharge Instructions (Addendum)
Take Flagyl as prescribed.  Take the entire course. Do not drink alcohol while taking this medication. Follow-up with OB/GYN as needed for further abdominal cramping or vaginal discharge. Return to the emergency room if you develop high fevers, severe/constant abdominal pain, or any new or concerning symptoms.

## 2018-10-10 NOTE — ED Triage Notes (Signed)
C/o vaginal d/c x 1 month-self treated for yeast infection with no improvement-NAD-steady gait

## 2018-10-10 NOTE — ED Provider Notes (Signed)
[facility] EMERGENCY DEPARTMENT Provider Note   CSN: [id number] Arrival date & time: 10/10/18  1930     History   Chief Complaint Chief Complaint  Patient presents with  . Vaginal Discharge    HPI April Wallace is a 22 y.o. female presenting for evaluation of vaginal discharge.  Patient states over the past month, she has been having persistent vaginal discharge.  It was initially thin and white, but over the past several days appears more green.  She tried Monistat in the beginning of the month, but this did not change her symptoms.  Patient denies vaginal irritation or pain.  She does report lower abdominal cramping, which is been present the past week.  She is started spotting the past several days, but not had her full period.  Her last period was September 25. She took 2 pregnancy tests at home, both of which were negative.  She is sexually active with one female partner.  She does not use birth control, but they do use condoms.  She has been tested for STDs, has not had any new partners since she was last tested.  Her partner has no symptoms.  She denies fevers, chills, nausea, vomiting, upper abdominal pain, urinary symptoms, abnormal bowel movements.  She has no medical problems, takes no medications daily.  She does not have an OB/GYN.  She is not concerned about STDs at this time.  HPI  Past Medical History:  Diagnosis Date  . Headache     Patient Active Problem List   Diagnosis Date Noted  . Anemia affecting pregnancy in first trimester 11/12/2017  . Bacterial vaginitis 10/30/2017  . Candida vaginitis 10/30/2017  . Abdominal cramping affecting pregnancy 10/30/2017  . Morning sickness 10/16/2017    Past Surgical History:  Procedure Laterality Date  . INDUCED ABORTION    . WISDOM TOOTH EXTRACTION       OB History    Gravida  3   Para      Term      Preterm      AB  2   Living        SAB  1   TAB      Ectopic      Multiple        Live Births               Home Medications    Prior to Admission medications   Medication Sig Start Date End Date Taking? Authorizing Provider  acetaminophen (TYLENOL) 325 MG tablet Take 650 mg by mouth every 6 (six) hours as needed for headache.    [provider]  butalbital-acetaminophen-caffeine (FIORICET, ESGIC) 50-325-40 MG tablet Take 1 tablet by mouth every 6 (six) hours as needed for headache. 11/19/17 11/19/18  Pincus Large, DO  desonide (DESOWEN) 0.05 % ointment Apply 1 application topically 2 (two) times daily. Apply to the hands 03/29/18   Arthor Captain, PA-C  ferrous sulfate 325 (65 FE) MG tablet Take 1 tablet (325 mg total) by mouth daily with breakfast. Take iron with juice, not milk or any dairy 11/12/17   Kooistra, Charlesetta Garibaldi, CNM  ibuprofen (ADVIL,MOTRIN) 800 MG tablet Take 1 tablet (800 mg total) by mouth every 8 (eight) hours as needed. 05/21/18   Petrucelli, Samantha R, PA-C  ketoconazole (NIZORAL) 2 % cream Apply 1 application topically daily. Apply to the vaginal region. 03/29/18   Arthor Captain, PA-C  metroNIDAZOLE (FLAGYL) 500 MG tablet Take 1 tablet (500 mg  total) by mouth 2 (two) times daily. 10/10/18   Mathew Storck, PA-C  ondansetron (ZOFRAN-ODT) 4 MG disintegrating tablet Take 1 tablet (4 mg total) by mouth every 8 (eight) hours as needed for nausea or vomiting. 11/19/17   Pincus Large, DO    Family History Family History  Problem Relation Age of Onset  . Diabetes Maternal Grandmother   . Seizures Maternal Grandmother   . Asthma Maternal Grandmother   . Cancer Maternal Grandfather     Social History Social History   Tobacco Use  . Smoking status: Never Smoker  . Smokeless tobacco: Never Used  Substance Use Topics  . Alcohol use: No  . Drug use: No     Allergies   Patient has no known allergies.   Review of Systems Review of Systems  Genitourinary: Positive for pelvic pain (lower abd cramping) and vaginal  discharge.  All other systems reviewed and are negative.    Physical Exam Updated Vital Signs BP 97/70 (BP Location: Right Arm)   Pulse 90   Temp 98.7 F (37.1 C) (Oral)   Resp 18   Wt 49.9 kg   LMP 09/10/2018   SpO2 100%   BMI 18.88 kg/m   Physical Exam  Constitutional: She is oriented to person, place, and time. She appears well-developed and well-nourished. No distress.  Resting comfortably in the bed in no acute distress  HENT:  Head: Normocephalic and atraumatic.  Eyes: Pupils are equal, round, and reactive to light. Conjunctivae and EOM are normal.  Neck: Normal range of motion. Neck supple.  Cardiovascular: Normal rate, regular rhythm and intact distal pulses.  Pulmonary/Chest: Effort normal and breath sounds normal. No respiratory distress. She has no wheezes.  Abdominal: Soft. She exhibits no distension and no mass. There is no tenderness. There is no rebound and no guarding.  No tenderness palpation of the abdomen.  Soft without rigidity, guarding, distention.  Negative rebound.  Genitourinary: Rectum normal, vagina normal and uterus normal. Pelvic exam was performed with patient supine. There is no rash, tenderness or lesion on the right labia. There is no rash, tenderness or lesion on the left labia. Cervix exhibits discharge. Cervix exhibits no motion tenderness and no friability. Right adnexum displays no mass, no tenderness and no fullness. Left adnexum displays no mass, no tenderness and no fullness.  Genitourinary Comments: Chaperone present.  Thin white discharge noted on exam.  No CMT.  No adnexal tenderness.  No tenderness to palpation of the uterus.  Musculoskeletal: Normal range of motion.  Neurological: She is alert and oriented to person, place, and time.  Skin: Skin is warm and dry. Capillary refill takes less than 2 seconds.  Psychiatric: She has a normal mood and affect.  Nursing note and vitals reviewed.    ED Treatments / Results  Labs (all labs  ordered are listed, but only abnormal results are displayed) Labs Reviewed  WET PREP, GENITAL - Abnormal; Notable for the following components:      Result Value   Clue Cells Wet Prep HPF POC PRESENT (*)    WBC, Wet Prep HPF POC MANY (*)    All other components within normal limits  URINALYSIS, ROUTINE W REFLEX MICROSCOPIC - Abnormal; Notable for the following components:   Specific Gravity, Urine >1.030 (*)    All other components within normal limits  PREGNANCY, URINE    EKG None  Radiology No results found.  Procedures Procedures (including critical care time)  Medications Ordered in ED Medications -  No data to display   Initial Impression / Assessment and Plan / ED Course  I have reviewed the triage vital signs and the nursing notes.  Pertinent labs & imaging results that were available during my care of the patient were reviewed by me and considered in my medical decision making (see chart for details).     Pt presenting for evaluation of vaginal discharge.  Physical exam reassuring, she is afebrile not tachycardic.  Appears nontoxic.  Abdominal exam reassuring, no tenderness.  Low suspicion for emergent or surgical etiology.  Pelvic exam with thin white discharge, no CMT or adnexal tenderness.  Discharge not consistent with gonorrhea or chlamydia.  As patient has had recent negative testing and no new partners since testing, she does not want any STD testing today, and I am agreeable with that plan.  Will send wet prep.  Wet prep positive for clue cells.  Discussed findings with patient.  Discussed treatment for BV.  Patient's abdominal cramping likely due to her period.  Doubt PID, torsion, TOA, cervicitis.  Discussed findings with patient.  Discussed follow-up with OB/GYN if discharge remains, cramping continues, her periods continue to be abnormal.  At this time, patient appears safe for discharge.  Return precautions given.  Patient states she understands and agrees to  plan.  Final Clinical Impressions(s) / ED Diagnoses   Final diagnoses:  BV (bacterial vaginosis)    ED Discharge Orders         Ordered    metroNIDAZOLE (FLAGYL) 500 MG tablet  2 times daily     10/10/18 2203           Alveria Apley, PA-C 10/10/18 2327    Arby Barrette, MD 10/12/18 1520

## 2018-12-06 ENCOUNTER — Encounter (HOSPITAL_BASED_OUTPATIENT_CLINIC_OR_DEPARTMENT_OTHER): Payer: Self-pay | Admitting: Emergency Medicine

## 2018-12-06 ENCOUNTER — Other Ambulatory Visit: Payer: Self-pay

## 2018-12-06 ENCOUNTER — Emergency Department (HOSPITAL_BASED_OUTPATIENT_CLINIC_OR_DEPARTMENT_OTHER)
Admission: EM | Admit: 2018-12-06 | Discharge: 2018-12-06 | Disposition: A | Discharge status: Home / Self Care | Payer: Self-pay | Attending: Emergency Medicine | Admitting: Emergency Medicine

## 2018-12-06 DIAGNOSIS — R1084 Generalized abdominal pain: Secondary | ICD-10-CM

## 2018-12-06 DIAGNOSIS — Z79899 Other long term (current) drug therapy: Secondary | ICD-10-CM | POA: Insufficient documentation

## 2018-12-06 DIAGNOSIS — K640 First degree hemorrhoids: Secondary | ICD-10-CM | POA: Insufficient documentation

## 2018-12-06 DIAGNOSIS — D500 Iron deficiency anemia secondary to blood loss (chronic): Secondary | ICD-10-CM | POA: Insufficient documentation

## 2018-12-06 LAB — COMPREHENSIVE METABOLIC PANEL
ALBUMIN: 4.6 g/dL (ref 3.5–5.0)
ALK PHOS: 65 U/L (ref 38–126)
ALT: 8 U/L (ref 0–44)
ANION GAP: 7 (ref 5–15)
AST: 17 U/L (ref 15–41)
BILIRUBIN TOTAL: 0.2 mg/dL — AB (ref 0.3–1.2)
BUN: 18 mg/dL (ref 6–20)
CALCIUM: 9.7 mg/dL (ref 8.9–10.3)
CO2: 22 mmol/L (ref 22–32)
CREATININE: 0.57 mg/dL (ref 0.44–1.00)
Chloride: 105 mmol/L (ref 98–111)
GFR calc Af Amer: >60 mL/min (ref >60)
GFR calc non Af Amer: >60 mL/min (ref >60)
Glucose, Bld: 94 mg/dL (ref 70–99)
Potassium: 3.8 mmol/L (ref 3.5–5.1)
Sodium: 134 mmol/L — ABNORMAL LOW (ref 135–145)
TOTAL PROTEIN: 7.9 g/dL (ref 6.5–8.1)

## 2018-12-06 LAB — CBC
HCT: 29.3 % — ABNORMAL LOW (ref 36.0–46.0)
Hemoglobin: 8.3 g/dL — ABNORMAL LOW (ref 12.0–15.0)
MCH: 21.3 pg — AB (ref 26.0–34.0)
MCHC: 28.3 g/dL — AB (ref 30.0–36.0)
MCV: 75.1 fL — AB (ref 80.0–100.0)
PLATELETS: 208 10*3/uL (ref 150–400)
RBC: 3.9 MIL/uL (ref 3.87–5.11)
RDW: 17.8 % — AB (ref 11.5–15.5)
WBC: 6.2 10*3/uL (ref 4.0–10.5)
nRBC: 0 % (ref 0.0–0.2)

## 2018-12-06 LAB — URINALYSIS, ROUTINE W REFLEX MICROSCOPIC
BILIRUBIN URINE: NEGATIVE
GLUCOSE, UA: NEGATIVE mg/dL
HGB URINE DIPSTICK: NEGATIVE
KETONES UR: NEGATIVE mg/dL
Leukocytes, UA: NEGATIVE
Nitrite: NEGATIVE
PROTEIN: 30 mg/dL — AB
Specific Gravity, Urine: 1.025 (ref 1.005–1.030)
pH: 6.5 (ref 5.0–8.0)

## 2018-12-06 LAB — URINALYSIS, MICROSCOPIC (REFLEX): RBC / HPF: NONE SEEN RBC/hpf (ref 0–5)

## 2018-12-06 LAB — LIPASE, BLOOD: Lipase: 28 U/L (ref 11–51)

## 2018-12-06 LAB — PREGNANCY, URINE: PREG TEST UR: NEGATIVE

## 2018-12-06 MED ORDER — DOCUSATE SODIUM 250 MG PO CAPS: 250.0000 mg | ORAL_CAPSULE | Freq: Every day | ORAL | 0 refills | Status: AC

## 2018-12-06 MED ORDER — SODIUM CHLORIDE 0.9 % IV BOLUS
1000.0000 mL | Freq: Once | INTRAVENOUS | Status: AC
  Administered 2018-12-06: 1000 mL via INTRAVENOUS

## 2018-12-06 MED ORDER — METOCLOPRAMIDE HCL 5 MG/ML IJ SOLN
10.0000 mg | Freq: Once | INTRAMUSCULAR | Status: AC
  Administered 2018-12-06: 10 mg via INTRAVENOUS
  Filled 2018-12-06: qty 2

## 2018-12-06 MED ORDER — DIPHENHYDRAMINE HCL 50 MG/ML IJ SOLN
25.0000 mg | Freq: Once | INTRAMUSCULAR | Status: AC
  Administered 2018-12-06: 25 mg via INTRAVENOUS
  Filled 2018-12-06: qty 1

## 2018-12-06 MED ORDER — FERROUS SULFATE 325 (65 FE) MG PO TABS: 325.0000 mg | ORAL_TABLET | Freq: Every day | ORAL | 0 refills | Status: DC

## 2018-12-06 MED ORDER — IBUPROFEN 600 MG PO TABS: 600.0000 mg | ORAL_TABLET | Freq: Three times a day (TID) | ORAL | 0 refills | Status: DC | PRN

## 2018-12-06 NOTE — ED Provider Notes (Signed)
[facility] EMERGENCY DEPARTMENT Provider Note   CSN: [id number] Arrival date & time: 12/06/18  1202     History   Chief Complaint Chief Complaint  Patient presents with  . Abdominal Pain  . Rectal Bleeding  . Headache    HPI Jyasia Jenne CampusLatrese Allbaugh is a 22 y.o. female.  The history is provided by the patient.     22 yo F with h/o vaginitis, here with abdominal pain, headaches, general fatigue.  Pt states that her primary complaint is mild, intermittent abdominal pain and cramping. She reports that this has been ongoing for one week, with associated decreased BM frequency. Pt states she has chronic intermittent abdominal pain and that she only has one BM every few days. Over the past week, she's noticed mild rectal pain with BM and small amounts of bright red blood per rectum, as streaks on the toilet paper. The stool itself, however, has been soft and brown. The pain seems to come and go, cramp-like, with occasional nausea. Pt also notes mild generalized headache and fatigue, which is a chronic but acutely worsened issue. No fevers. She does note she ahs been under increased stress recently.  Past Medical History:  Diagnosis Date  . Headache     Patient Active Problem List   Diagnosis Date Noted  . Anemia affecting pregnancy in first trimester 11/12/2017  . Bacterial vaginitis 10/30/2017  . Candida vaginitis 10/30/2017  . Abdominal cramping affecting pregnancy 10/30/2017  . Morning sickness 10/16/2017    Past Surgical History:  Procedure Laterality Date  . INDUCED ABORTION    . WISDOM TOOTH EXTRACTION       OB History    Gravida  3   Para      Term      Preterm      AB  2   Living        SAB  1   TAB      Ectopic      Multiple      Live Births               Home Medications    Prior to Admission medications   Medication Sig Start Date End Date Taking? Authorizing Provider  acetaminophen (TYLENOL) 325 MG tablet Take 650 mg by  mouth every 6 (six) hours as needed for headache.    [provider]  desonide (DESOWEN) 0.05 % ointment Apply 1 application topically 2 (two) times daily. Apply to the hands 03/29/18   Arthor CaptainHarris, Abigail, PA-C  docusate sodium (COLACE) 250 MG capsule Take 1 capsule (250 mg total) by mouth daily for 14 days. 12/06/18 12/20/18  Shaune PollackIsaacs, Trapper Meech, MD  ferrous sulfate 325 (65 FE) MG tablet Take 1 tablet (325 mg total) by mouth daily with breakfast for 14 days. 12/06/18 12/20/18  Shaune PollackIsaacs, Matsuko Kretz, MD  ibuprofen (ADVIL,MOTRIN) 600 MG tablet Take 1 tablet (600 mg total) by mouth every 8 (eight) hours as needed for moderate pain. 12/06/18   Shaune PollackIsaacs, Meisha Salone, MD  ketoconazole (NIZORAL) 2 % cream Apply 1 application topically daily. Apply to the vaginal region. 03/29/18   Harris, Cammy CopaAbigail, PA-C  metroNIDAZOLE (FLAGYL) 500 MG tablet Take 1 tablet (500 mg total) by mouth 2 (two) times daily. 10/10/18   Caccavale, Sophia, PA-C  ondansetron (ZOFRAN-ODT) 4 MG disintegrating tablet Take 1 tablet (4 mg total) by mouth every 8 (eight) hours as needed for nausea or vomiting. 11/19/17   Pincus LargePhelps, Jazma Y, DO    Family History Family History  Problem Relation Age of Onset  . Diabetes Maternal Grandmother   . Seizures Maternal Grandmother   . Asthma Maternal Grandmother   . Cancer Maternal Grandfather     Social History Social History   Tobacco Use  . Smoking status: Never Smoker  . Smokeless tobacco: Never Used  Substance Use Topics  . Alcohol use: No  . Drug use: No     Allergies   Patient has no known allergies.   Review of Systems Review of Systems  Constitutional: Positive for fatigue. Negative for chills and fever.  HENT: Negative for congestion and rhinorrhea.   Eyes: Negative for visual disturbance.  Respiratory: Negative for cough, shortness of breath and wheezing.   Cardiovascular: Negative for chest pain and leg swelling.  Gastrointestinal: Positive for abdominal pain, blood in stool and  nausea. Negative for diarrhea and vomiting.  Genitourinary: Negative for dysuria and flank pain.  Musculoskeletal: Negative for neck pain and neck stiffness.  Skin: Negative for rash and wound.  Allergic/Immunologic: Negative for immunocompromised state.  Neurological: Negative for syncope, weakness and headaches.  All other systems reviewed and are negative.    Physical Exam Updated Vital Signs BP 102/68 (BP Location: Right Arm)   Pulse 78   Temp (!) 97.5 F (36.4 C) (Oral)   Resp 16   Ht 5\' 4"  (1.626 m)   Wt 49.9 kg   LMP 11/05/2018 (Exact Date)   SpO2 100%   BMI 18.88 kg/m   Physical Exam Vitals signs and nursing note reviewed.  Constitutional:      General: She is not in acute distress.    Appearance: She is well-developed.  HENT:     Head: Normocephalic and atraumatic.  Eyes:     Conjunctiva/sclera: Conjunctivae normal.  Neck:     Musculoskeletal: Neck supple.  Cardiovascular:     Rate and Rhythm: Normal rate and regular rhythm.     Heart sounds: Normal heart sounds. No murmur. No friction rub.  Pulmonary:     Effort: Pulmonary effort is normal. No respiratory distress.     Breath sounds: Normal breath sounds. No wheezing or rales.  Abdominal:     General: Abdomen is flat. Bowel sounds are normal. There is no distension.     Palpations: Abdomen is soft.     Tenderness: There is no abdominal tenderness. There is no right CVA tenderness or left CVA tenderness. Negative signs include Murphy's sign and McBurney's sign.     Comments: No significant TTP. Bowel sounds normal. No distension.  Genitourinary:    Comments: Non-thrombosed external hemorrhoids. Mild TTP. No perirectal fluctuance, warmth, or abscesses. Skin:    General: Skin is warm.     Capillary Refill: Capillary refill takes less than 2 seconds.  Neurological:     Mental Status: She is alert and oriented to person, place, and time.     Motor: No abnormal muscle tone.      ED Treatments / Results    Labs (all labs ordered are listed, but only abnormal results are displayed) Labs Reviewed  COMPREHENSIVE METABOLIC PANEL - Abnormal; Notable for the following components:      Result Value   Sodium 134 (*)    Total Bilirubin 0.2 (*)    All other components within normal limits  CBC - Abnormal; Notable for the following components:   Hemoglobin 8.3 (*)    HCT 29.3 (*)    MCV 75.1 (*)    MCH 21.3 (*)    MCHC 28.3 (*)  RDW 17.8 (*)    All other components within normal limits  URINALYSIS, ROUTINE W REFLEX MICROSCOPIC - Abnormal; Notable for the following components:   APPearance CLOUDY (*)    Protein, ur 30 (*)    All other components within normal limits  URINALYSIS, MICROSCOPIC (REFLEX) - Abnormal; Notable for the following components:   Bacteria, UA MANY (*)    All other components within normal limits  LIPASE, BLOOD  PREGNANCY, URINE    EKG None  Radiology No results found.  Procedures Procedures (including critical care time)  Medications Ordered in ED Medications  sodium chloride 0.9 % bolus 1,000 mL (0 mLs Intravenous Stopped 12/06/18 1405)  metoCLOPramide (REGLAN) injection 10 mg (10 mg Intravenous Given 12/06/18 1301)  diphenhydrAMINE (BENADRYL) injection 25 mg (25 mg Intravenous Given 12/06/18 1301)     Initial Impression / Assessment and Plan / ED Course  I have reviewed the triage vital signs and the nursing notes.  Pertinent labs & imaging results that were available during my care of the patient were reviewed by me and considered in my medical decision making (see chart for details).     22 yo F here w/ mild diffuse abd pain, BRBPR. I suspect pt has component of IBS, also consideration of viral Gi illness. Pt has h/o frequent abd cramping, change in bowel habits, and constipation and exam shows non-thrombosed but tender external hemorrhoids, which I suspect are etiology for her bleeding. No h/o IBD or Crohn's. No weight loss or night sweats. Her abd  is otherwise soft, ND, and non-tender and LFTs, Bili wnl. No focal TTP to suggest cholecystitis or appendicitis. No fever or infectious sx. UA neg and UPT neg. Given reassuring labs, vitals, and serial exams, do not feel CT imaging benefits outweigh risks at this time. I suspect she has IBS vs viral Gi syndrome with exacerbation of hemorrhoids. She also appears to have a chronic IDA for which she admits to not taking iron. Will start on iron, tx symptomatically, and refer for PCP and possibly GI follow-up.  Final Clinical Impressions(s) / ED Diagnoses   Final diagnoses:  Iron deficiency anemia due to chronic blood loss  Grade I hemorrhoids  Generalized abdominal pain    ED Discharge Orders         Ordered    ferrous sulfate 325 (65 FE) MG tablet  Daily with breakfast     12/06/18 1503    docusate sodium (COLACE) 250 MG capsule  Daily     12/06/18 1503    ibuprofen (ADVIL,MOTRIN) 600 MG tablet  Every 8 hours PRN     12/06/18 1503           Shaune PollackIsaacs, Ladell Bey, MD 12/06/18 1937

## 2018-12-06 NOTE — ED Triage Notes (Signed)
Pt here with abdominal pain, bright red blood in toilet after a bowel movement and a headache x 1 week. Patient states it is painful to have bowel movements, but they are regular.

## 2018-12-06 NOTE — ED Notes (Signed)
ED Provider at bedside. 

## 2019-02-24 IMAGING — US US OB COMP LESS 14 WK
1 series · 15 of 28 positions shown · non-contrast
Comparison: None.

CLINICAL DATA: Pelvic pain since 09/21/2017. Estimated gestational
age by LMP is 5 weeks 1 day. Quantitative beta HCG is pending.

EXAM:
OBSTETRIC <14 WK US AND TRANSVAGINAL OB US
TECHNIQUE: Both transabdominal and transvaginal ultrasound examinations were
performed for complete evaluation of the gestation as well as the
maternal uterus, adnexal regions, and pelvic cul-de-sac.
Transvaginal technique was performed to assess early pregnancy.

[Series 1: us ob comp less 14 wk · 15 of 67 slices shown]
[im 1/67]
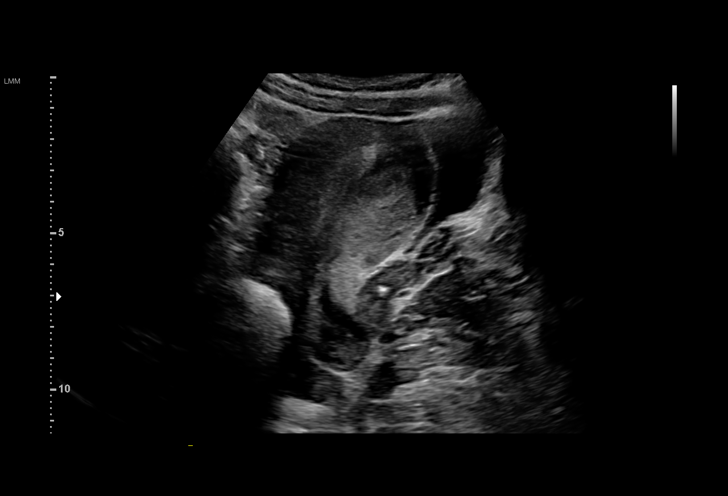
[im 5/67]
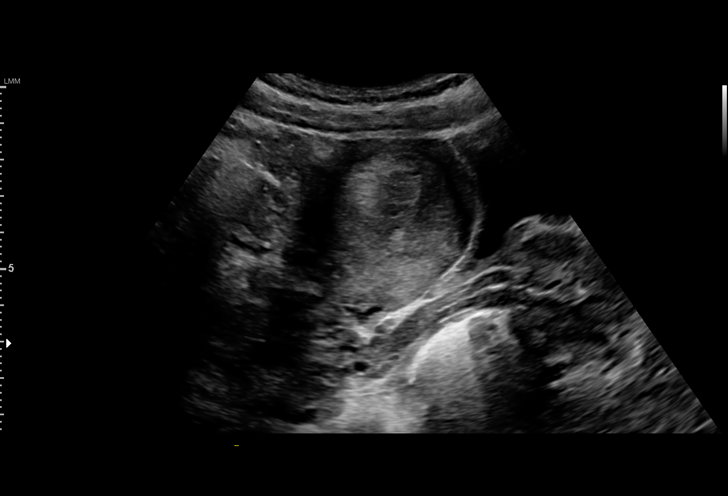
[im 10/67]
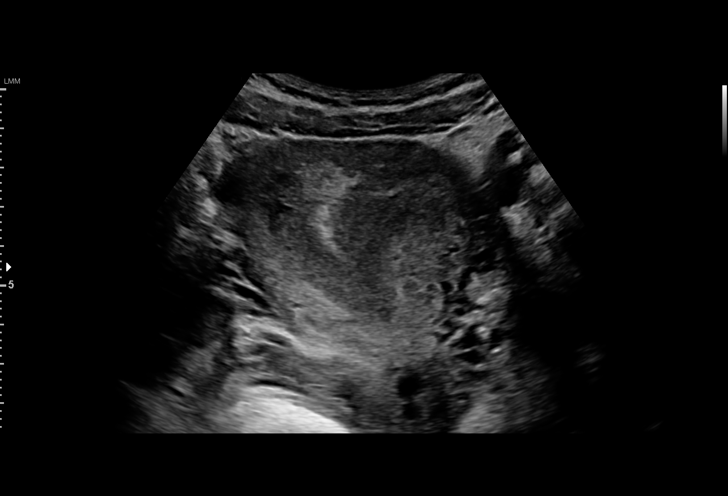
[im 15/67]
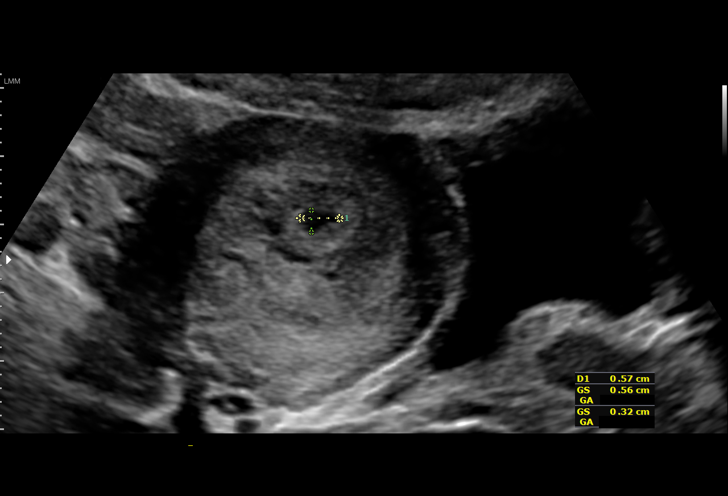
[im 20/67]
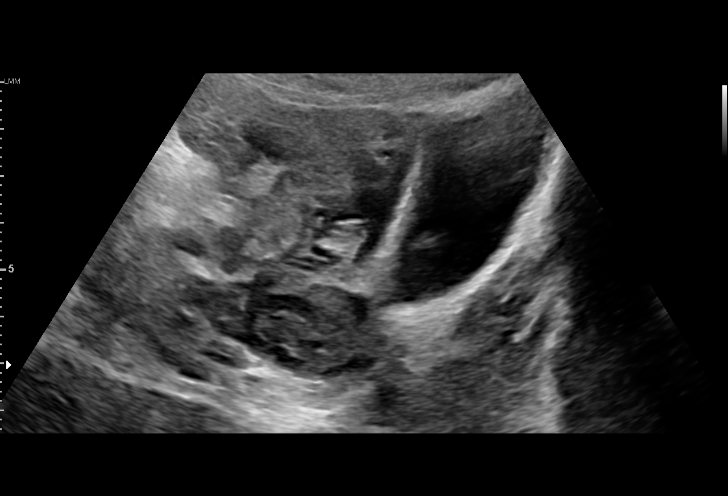
[im 25/67]
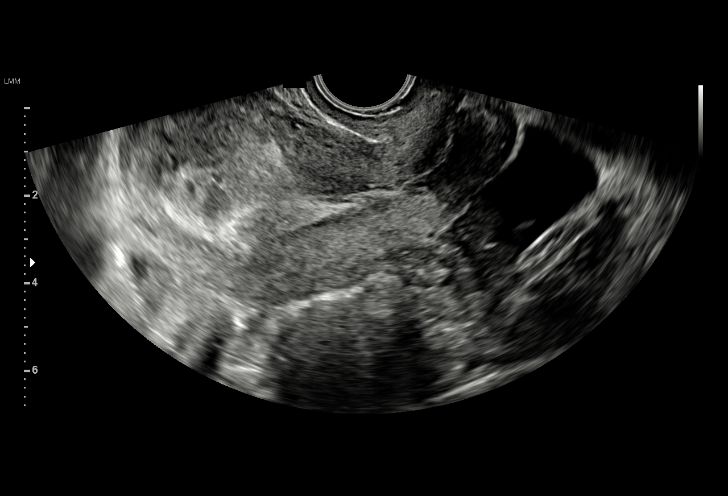
[im 30/67]
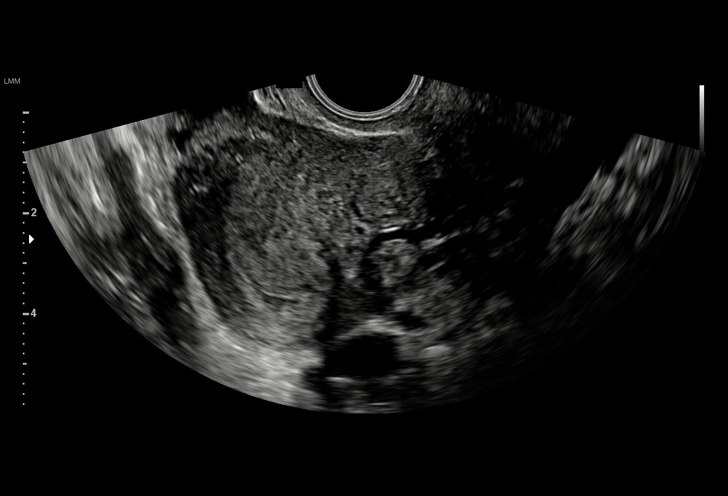
[im 35/67]
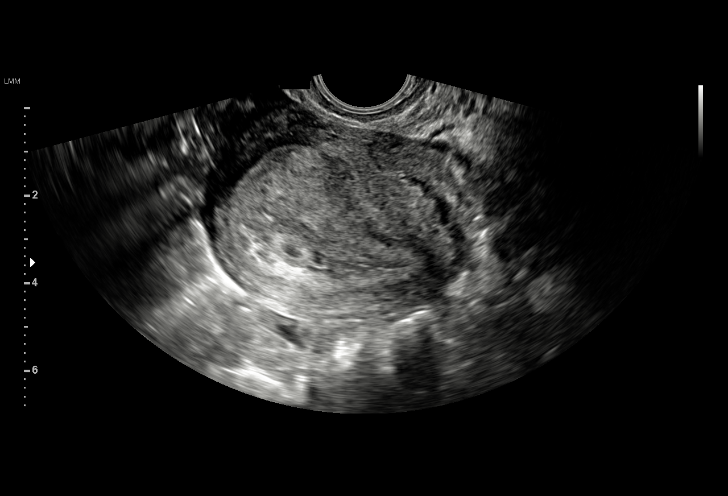
[im 37/67]
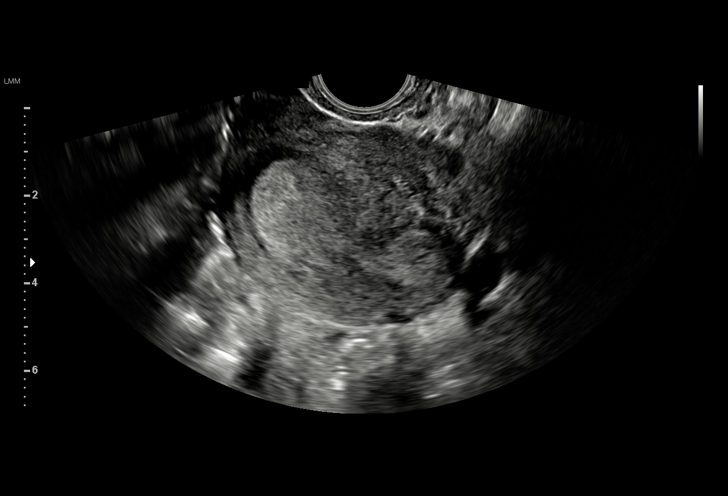
[im 42/67]
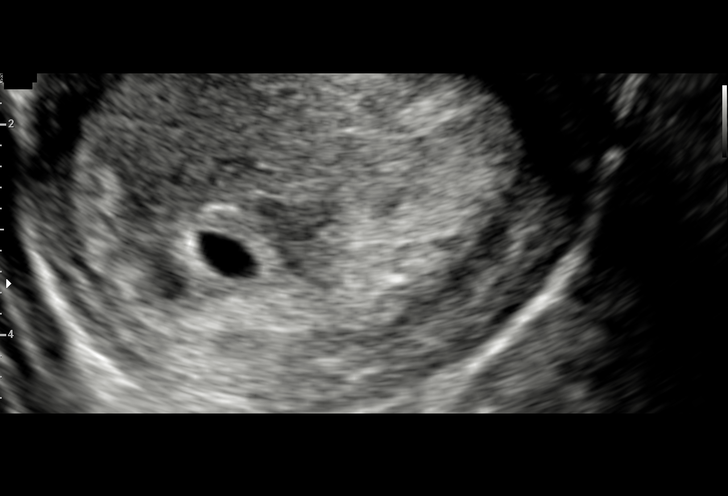
[im 47/67]
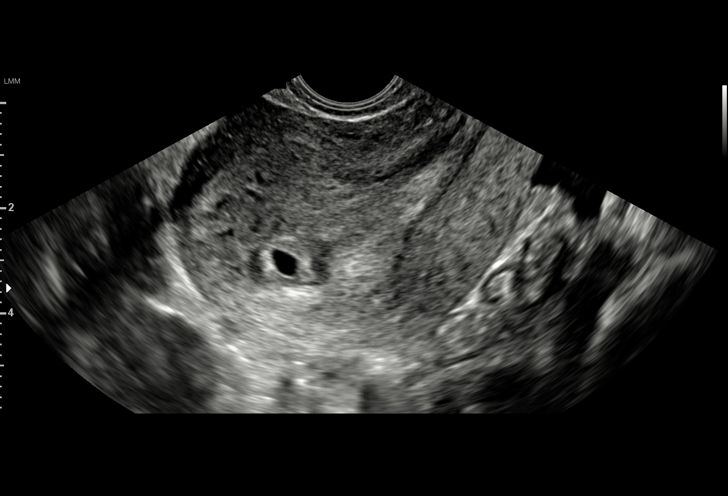
[im 52/67]
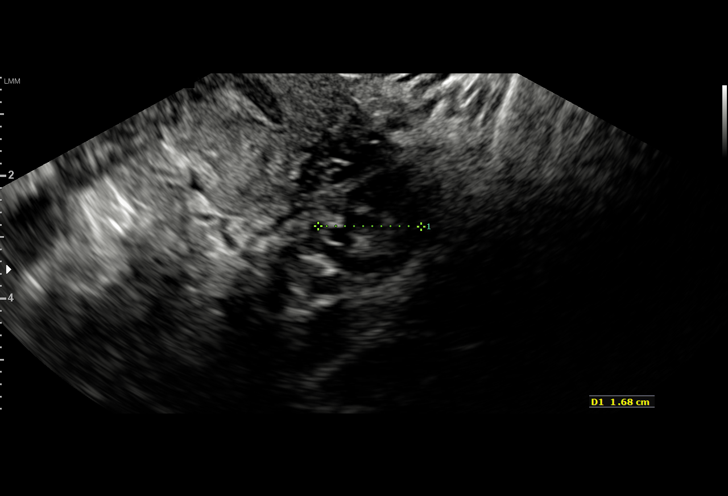
[im 57/67]
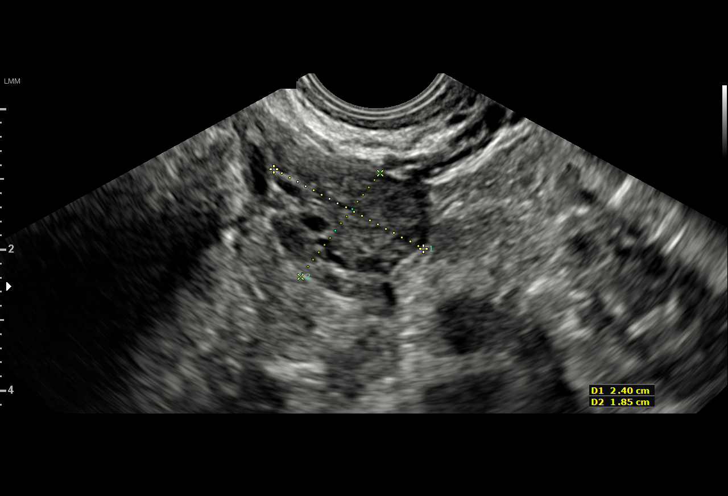
[im 62/67]
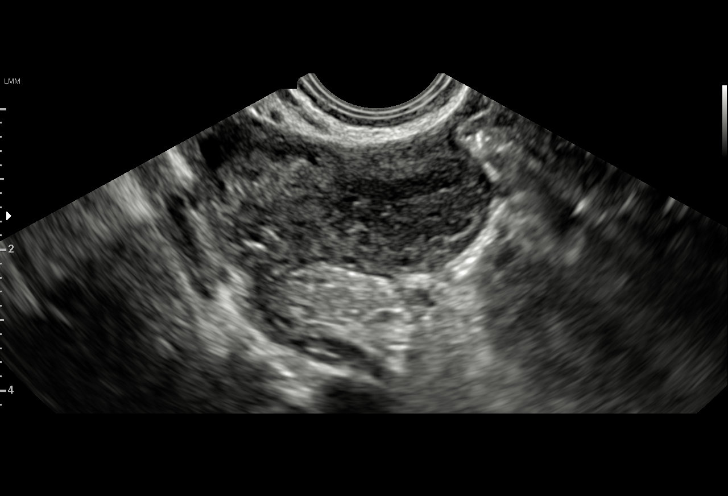
[im 67/67]
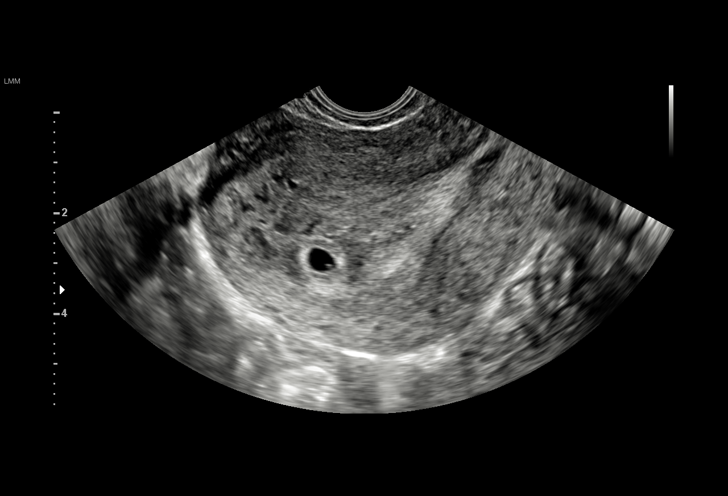

[15 of 28 positions shown; findings below may reference images not displayed]

FINDINGS: Intrauterine gestational sac: A single intrauterine gestational sac
is identified.

Yolk sac:  Not Visualized.

Embryo:  Not Visualized.

Cardiac Activity: Not Visualized.

MSD: 5.9  mm   5 w   2  d

Subchorionic hemorrhage:  None visualized.

Maternal uterus/adnexae: The uterus is anteverted. The endometrium
is somewhat thickened and heterogeneous with possible increased
vascularity of the anterior fundal level. This appearance is
nonspecific but could indicate gestational trophoblastic disease,
possibly representing a partial molar pregnancy. No abnormal
endometrial fluid. Both ovaries are visualized and are unremarkable
in appearance. No abnormal adnexal masses. Probable corpus luteal
cyst on the right. Small amount of free fluid in the pelvis.
IMPRESSION: Probable early intrauterine gestational sac, but no yolk sac, fetal
pole, or cardiac activity yet visualized. Nonspecific heterogeneous
appearance of the endometrium. Recommend follow-up quantitative
B-HCG levels and follow-up US in 14 days to assess viability. This
recommendation follows SRU consensus guidelines: Diagnostic Criteria
for Nonviable Pregnancy Early in the First Trimester. N Engl J Med

## 2019-09-08 ENCOUNTER — Other Ambulatory Visit: Payer: Self-pay | Admitting: General Practice

## 2019-09-08 ENCOUNTER — Ambulatory Visit (HOSPITAL_COMMUNITY)
Admission: EM | Admit: 2019-09-08 | Discharge: 2019-09-08 | Disposition: A | Discharge status: Home / Self Care | Payer: Self-pay | Attending: Family Medicine | Admitting: Family Medicine

## 2019-09-08 ENCOUNTER — Other Ambulatory Visit: Payer: Self-pay

## 2019-09-08 ENCOUNTER — Encounter (HOSPITAL_COMMUNITY): Payer: Self-pay

## 2019-09-08 DIAGNOSIS — T192XXA Foreign body in vulva and vagina, initial encounter: Secondary | ICD-10-CM | POA: Insufficient documentation

## 2019-09-08 DIAGNOSIS — Z79899 Other long term (current) drug therapy: Secondary | ICD-10-CM | POA: Insufficient documentation

## 2019-09-08 DIAGNOSIS — N760 Acute vaginitis: Secondary | ICD-10-CM

## 2019-09-08 DIAGNOSIS — B9689 Other specified bacterial agents as the cause of diseases classified elsewhere: Secondary | ICD-10-CM

## 2019-09-08 MED ORDER — METRONIDAZOLE 500 MG PO TABS: 500.0000 mg | ORAL_TABLET | Freq: Two times a day (BID) | ORAL | 0 refills | Status: DC

## 2019-09-08 NOTE — Discharge Instructions (Addendum)
We removed the condom from your vagina Treating you for bacterial vaginosis today based on your exam take the medication as prescribed. We will call you with any positive results of your swab.

## 2019-09-08 NOTE — ED Triage Notes (Signed)
Pt presents with foreign body in vagina since this morning; pt thinks it may be a condom.

## 2019-09-09 LAB — CERVICOVAGINAL ANCILLARY ONLY
Bacterial Vaginitis (gardnerella): POSITIVE — AB
Candida Glabrata: NEGATIVE
Candida Vaginitis: POSITIVE — AB
Molecular Disclaimer: NEGATIVE
Molecular Disclaimer: NEGATIVE
Molecular Disclaimer: NEGATIVE
Molecular Disclaimer: NORMAL
Trichomonas: NEGATIVE

## 2019-09-09 NOTE — ED Provider Notes (Signed)
[facility]    CSN: [id number] Arrival date & time: 09/08/19  1319      History   Chief Complaint Chief Complaint  Patient presents with  . Foreign Body in Vagina    HPI April Wallace is a 23 y.o. female.   Patient is a 23-year female presents today with foreign body in vagina.  Reports condom.  This occurred during sexual activity this morning.  Symptoms have been constant.  She denies any associated pain, fevers or vaginal discharge. Currently sexually active with one partner. Patient's last menstrual period was 08/16/2019.  ROS per HPI      Past Medical History:  Diagnosis Date  . Headache     Patient Active Problem List   Diagnosis Date Noted  . Anemia affecting pregnancy in first trimester 11/12/2017  . Bacterial vaginitis 10/30/2017  . Candida vaginitis 10/30/2017  . Abdominal cramping affecting pregnancy 10/30/2017  . Morning sickness 10/16/2017    Past Surgical History:  Procedure Laterality Date  . INDUCED ABORTION    . WISDOM TOOTH EXTRACTION      OB History    Gravida  3   Para      Term      Preterm      AB  2   Living        SAB  1   TAB      Ectopic      Multiple      Live Births               Home Medications    Prior to Admission medications   Medication Sig Start Date End Date Taking? Authorizing Provider  acetaminophen (TYLENOL) 325 MG tablet Take 650 mg by mouth every 6 (six) hours as needed for headache.    [provider]  desonide (DESOWEN) 0.05 % ointment Apply 1 application topically 2 (two) times daily. Apply to the hands 03/29/18   Arthor Captain, PA-C  ferrous sulfate 325 (65 FE) MG tablet Take 1 tablet (325 mg total) by mouth daily with breakfast for 14 days. 12/06/18 12/20/18  Shaune Pollack, MD  ibuprofen (ADVIL,MOTRIN) 600 MG tablet Take 1 tablet (600 mg total) by mouth every 8 (eight) hours as needed for moderate pain. 12/06/18   Shaune Pollack, MD  ketoconazole (NIZORAL) 2  % cream Apply 1 application topically daily. Apply to the vaginal region. 03/29/18   Harris, Cammy Copa, PA-C  metroNIDAZOLE (FLAGYL) 500 MG tablet Take 1 tablet (500 mg total) by mouth 2 (two) times daily. 09/08/19   Dahlia Byes A, NP  ondansetron (ZOFRAN-ODT) 4 MG disintegrating tablet Take 1 tablet (4 mg total) by mouth every 8 (eight) hours as needed for nausea or vomiting. 11/19/17   Pincus Large, DO    Family History Family History  Problem Relation Age of Onset  . Diabetes Maternal Grandmother   . Seizures Maternal Grandmother   . Asthma Maternal Grandmother   . Cancer Maternal Grandfather     Social History Social History   Tobacco Use  . Smoking status: Never Smoker  . Smokeless tobacco: Never Used  Substance Use Topics  . Alcohol use: No  . Drug use: No     Allergies   Patient has no known allergies.   Review of Systems Review of Systems   Physical Exam Triage Vital Signs ED Triage Vitals  Enc Vitals Group     BP 09/08/19 1358 115/65     Pulse Rate 09/08/19 1358 77  Resp 09/08/19 1358 18     Temp 09/08/19 1358 98.6 F (37 C)     Temp Source 09/08/19 1358 Oral     SpO2 09/08/19 1358 95 %     Weight --      Height --      Head Circumference --      Peak Flow --      Pain Score 09/08/19 1400 0     Pain Loc --      Pain Edu? --      Excl. in [place]? --    No data found.  Updated Vital Signs BP 115/65 (BP Location: Right Arm)   Pulse 77   Temp 98.6 F (37 C) (Oral)   Resp 18   LMP 08/16/2019   SpO2 95%   Visual Acuity Right Eye Distance:   Left Eye Distance:   Bilateral Distance:    Right Eye Near:   Left Eye Near:    Bilateral Near:     Physical Exam Vitals signs and nursing note reviewed.  Constitutional:      General: She is not in acute distress.    Appearance: Normal appearance. She is not ill-appearing, toxic-appearing or diaphoretic.  HENT:     Head: Normocephalic.     Nose: Nose normal.     Mouth/Throat:     Pharynx:  Oropharynx is clear.  Eyes:     Conjunctiva/sclera: Conjunctivae normal.  Neck:     Musculoskeletal: Normal range of motion.  Pulmonary:     Effort: Pulmonary effort is normal.  Abdominal:     Palpations: Abdomen is soft.     Tenderness: There is no abdominal tenderness.  Genitourinary:    Vagina: Vaginal discharge present.     Comments: White milky discharge in vaginal vault.  Condom removed.  Foul fishy odor.  No CMT  Musculoskeletal: Normal range of motion.  Skin:    General: Skin is warm and dry.     Findings: No rash.  Neurological:     Mental Status: She is alert.  Psychiatric:        Mood and Affect: Mood normal.      UC Treatments / Results  Labs (all labs ordered are listed, but only abnormal results are displayed) Labs Reviewed  CERVICOVAGINAL ANCILLARY ONLY    EKG   Radiology No results found.  Procedures Procedures (including critical care time)  Medications Ordered in UC Medications - No data to display  Initial Impression / Assessment and Plan / UC Course  I have reviewed the triage vital signs and the nursing notes.  Pertinent labs & imaging results that were available during my care of the patient were reviewed by me and considered in my medical decision making (see chart for details).     Removed condom from vaginal vault.  Foul vaginal discharge Treating for BV based on symptoms and exam  Follow up as needed for continued or worsening symptoms  Final Clinical Impressions(s) / UC Diagnoses   Final diagnoses:  None     Discharge Instructions     We removed the condom from your vagina Treating you for bacterial vaginosis today based on your exam take the medication as prescribed. We will call you with any positive results of your swab.    ED Prescriptions    Medication Sig Dispense Auth. Provider   metroNIDAZOLE (FLAGYL) 500 MG tablet Take 1 tablet (500 mg total) by mouth 2 (two) times daily. 14 tablet Orvan July, NP  PDMP not reviewed this encounter.   Dahlia Byes A, NP 09/09/19 [phone]

## 2019-09-10 ENCOUNTER — Telehealth (HOSPITAL_COMMUNITY): Payer: Self-pay | Admitting: Emergency Medicine

## 2019-09-10 LAB — CERVICOVAGINAL ANCILLARY ONLY
Chlamydia: NEGATIVE
Neisseria Gonorrhea: NEGATIVE

## 2019-09-10 MED ORDER — FLUCONAZOLE 150 MG PO TABS: 150.0000 mg | ORAL_TABLET | Freq: Once | ORAL | 0 refills | Status: AC

## 2019-09-10 NOTE — Telephone Encounter (Signed)
Bacterial Vaginosis test is positive.  Prescription for metronidazole was given at the urgent care visit. Pt contacted regarding results. Answered all questions. Verbalized understanding.  Test for candida (yeast) was positive.  Prescription for fluconazole 150mg  po now, repeat dose in 3d if needed, #2 no refills, sent to the pharmacy of record.  Recheck or followup with PCP for further evaluation if symptoms are not improving.    Patient contacted and made aware of    results, all questions answered

## 2020-08-05 ENCOUNTER — Ambulatory Visit (HOSPITAL_COMMUNITY)
Admission: EM | Admit: 2020-08-05 | Discharge: 2020-08-05 | Disposition: A | Discharge status: Home / Self Care | Payer: BLUE CROSS/BLUE SHIELD | Attending: Family Medicine | Admitting: Family Medicine

## 2020-08-05 ENCOUNTER — Encounter (HOSPITAL_COMMUNITY): Payer: Self-pay

## 2020-08-05 DIAGNOSIS — R109 Unspecified abdominal pain: Secondary | ICD-10-CM

## 2020-08-05 DIAGNOSIS — R1030 Lower abdominal pain, unspecified: Secondary | ICD-10-CM | POA: Diagnosis present

## 2020-08-05 DIAGNOSIS — Z3202 Encounter for pregnancy test, result negative: Secondary | ICD-10-CM | POA: Diagnosis not present

## 2020-08-05 LAB — POCT URINALYSIS DIPSTICK, ED / UC
Bilirubin Urine: NEGATIVE
Glucose, UA: NEGATIVE mg/dL
Hgb urine dipstick: NEGATIVE
Leukocytes,Ua: NEGATIVE
Nitrite: NEGATIVE
Protein, ur: NEGATIVE mg/dL
Specific Gravity, Urine: >=1.03 (ref 1.005–1.030)
Urobilinogen, UA: 0.2 mg/dL (ref 0.0–1.0)
pH: 7 (ref 5.0–8.0)

## 2020-08-05 LAB — POC URINE PREG, ED: Preg Test, Ur: NEGATIVE

## 2020-08-05 MED ORDER — NAPROXEN 500 MG PO TABS: 500.0000 mg | ORAL_TABLET | Freq: Two times a day (BID) | ORAL | 0 refills | Status: DC

## 2020-08-05 NOTE — ED Triage Notes (Signed)
Pt c/o intermittent generalized abdominal pain for approx 2 weeks. Denies fever, chills, n/v/d, vaginal discharge/irregular bleeding, dysuria sx.

## 2020-08-05 NOTE — Discharge Instructions (Signed)
Pregnancy test negative Urine without infection, did appear concentrated- drink plenty of fluids STD screening pending Naprosyn twice daily as needed for cramping Follow up with OBGYN as planned Return if pain changing or worsening

## 2020-08-06 NOTE — ED Provider Notes (Signed)
[facility]    CSN: [id number] Arrival date & time: 08/05/20  1940      History   Chief Complaint Chief Complaint  Patient presents with   Abdominal Pain    HPI April Wallace is a 24 y.o. female presenting today for evaluation of abdominal pain.  Patient reports over the past 2 weeks she has had intermittent episodes of abdominal cramping.  Reports pain is mainly around her middle abdomen.  Symptoms will be brief and last only a few seconds and then subside.  Typically will happen daily.  Cannot correlate to any specific time of the day.  Reports on correlated to eating and drinking.  Has been eating and drinking normally without worsening pain.  Denies any nausea or vomiting.  Bowel movements have been regular.  Last bowel movement today.  Denies diarrhea/constipation.  Denies radiation of pain into back to chest or pelvis area.  Denies urinary symptoms.  Denies any abnormal discharge.  Does report last menstrual cycle was 7/12 and is approximately 1 to 2 weeks late for starting her menstrual cycle.  Took multiple at home pregnancy test which were negative.  Can be normal for her to have irregular cycles.  Is not on birth control.  Sexually active, but denies new partners.  HPI  Past Medical History:  Diagnosis Date   Headache     Patient Active Problem List   Diagnosis Date Noted   Anemia affecting pregnancy in first trimester 11/12/2017   Bacterial vaginitis 10/30/2017   Candida vaginitis 10/30/2017   Abdominal cramping affecting pregnancy 10/30/2017   Morning sickness 10/16/2017    Past Surgical History:  Procedure Laterality Date   INDUCED ABORTION     WISDOM TOOTH EXTRACTION      OB History    Gravida  3   Para      Term      Preterm      AB  2   Living        SAB  1   TAB      Ectopic      Multiple      Live Births               Home Medications    Prior to Admission medications   Medication Sig Start Date  End Date Taking? Authorizing Provider  acetaminophen (TYLENOL) 325 MG tablet Take 650 mg by mouth every 6 (six) hours as needed for headache.    [provider]  desonide (DESOWEN) 0.05 % ointment Apply 1 application topically 2 (two) times daily. Apply to the hands 03/29/18   Arthor Captain, PA-C  ferrous sulfate 325 (65 FE) MG tablet Take 1 tablet (325 mg total) by mouth daily with breakfast for 14 days. 12/06/18 12/20/18  Shaune Pollack, MD  ibuprofen (ADVIL,MOTRIN) 600 MG tablet Take 1 tablet (600 mg total) by mouth every 8 (eight) hours as needed for moderate pain. 12/06/18   Shaune Pollack, MD  ketoconazole (NIZORAL) 2 % cream Apply 1 application topically daily. Apply to the vaginal region. 03/29/18   Harris, Cammy Copa, PA-C  naproxen (NAPROSYN) 500 MG tablet Take 1 tablet (500 mg total) by mouth 2 (two) times daily. 08/05/20   Treshaun Carrico C, PA-C  ondansetron (ZOFRAN-ODT) 4 MG disintegrating tablet Take 1 tablet (4 mg total) by mouth every 8 (eight) hours as needed for nausea or vomiting. 11/19/17   Pincus Large, DO    Family History Family History  Problem Relation Age  of Onset   Diabetes Maternal Grandmother    Seizures Maternal Grandmother    Asthma Maternal Grandmother    Cancer Maternal Grandfather     Social History Social History   Tobacco Use   Smoking status: Never Smoker   Smokeless tobacco: Never Used  Substance Use Topics   Alcohol use: No   Drug use: No     Allergies   Patient has no known allergies.   Review of Systems Review of Systems  Constitutional: Negative for fever.  Respiratory: Negative for shortness of breath.   Cardiovascular: Negative for chest pain.  Gastrointestinal: Positive for abdominal pain. Negative for diarrhea, nausea and vomiting.  Genitourinary: Positive for menstrual problem. Negative for dysuria, flank pain, genital sores, hematuria, vaginal bleeding, vaginal discharge and vaginal pain.  Musculoskeletal:  Negative for back pain.  Skin: Negative for rash.  Neurological: Negative for dizziness, light-headedness and headaches.     Physical Exam Triage Vital Signs ED Triage Vitals  Enc Vitals Group     BP 08/05/20 2045 121/74     Pulse Rate 08/05/20 2045 (!) 106     Resp 08/05/20 2045 18     Temp 08/05/20 2045 98.6 F (37 C)     Temp Source 08/05/20 2045 Oral     SpO2 08/05/20 2045 100 %     Weight --      Height --      Head Circumference --      Peak Flow --      Pain Score 08/05/20 2044 0     Pain Loc --      Pain Edu? --      Excl. in [place]? --    No data found.  Updated Vital Signs BP 121/74 (BP Location: Right Arm)    Pulse (!) 106    Temp 98.6 F (37 C) (Oral)    Resp 18    LMP 06/27/2020    SpO2 100%   Visual Acuity Right Eye Distance:   Left Eye Distance:   Bilateral Distance:    Right Eye Near:   Left Eye Near:    Bilateral Near:     Physical Exam Vitals and nursing note reviewed.  Constitutional:      Appearance: She is well-developed.     Comments: No acute distress  HENT:     Head: Normocephalic and atraumatic.     Nose: Nose normal.  Eyes:     Conjunctiva/sclera: Conjunctivae normal.  Cardiovascular:     Rate and Rhythm: Normal rate.  Pulmonary:     Effort: Pulmonary effort is normal. No respiratory distress.     Comments: Breathing comfortably at rest, CTABL, no wheezing, rales or other adventitious sounds auscultated Abdominal:     General: There is no distension.     Comments: Soft, nondistended, nontender to palpation of bilateral upper quadrants, epigastrium, mild tenderness to palpation to mid lower abdomen, negative rebound, negative Rovsing, negative McBurney's  Musculoskeletal:        General: Normal range of motion.     Cervical back: Neck supple.  Skin:    General: Skin is warm and dry.  Neurological:     Mental Status: She is alert and oriented to person, place, and time.      UC Treatments / Results  Labs (all labs ordered are  listed, but only abnormal results are displayed) Labs Reviewed  POCT URINALYSIS DIPSTICK, ED / UC - Abnormal; Notable for the following components:  Result Value   Ketones, ur TRACE (*)    All other components within normal limits  POC URINE PREG, ED  CERVICOVAGINAL ANCILLARY ONLY    EKG   Radiology No results found.  Procedures Procedures (including critical care time)  Medications Ordered in UC Medications - No data to display  Initial Impression / Assessment and Plan / UC Course  I have reviewed the triage vital signs and the nursing notes.  Pertinent labs & imaging results that were available during my care of the patient were reviewed by me and considered in my medical decision making (see chart for details).     Pregnancy test negative, UA only with trace ketones, no sign of infection.  Recommended screening for STDs given patient sexually active.  Low suspicion of GI etiology given oral intake normal without any correlation or triggering of pain.  No other associated symptoms.  Providing Naprosyn to use as needed in case related to abnormal menstrual cycle recommended following up with OB/GYN as she has planned later this month.  Continue to monitor,Discussed strict return precautions. Patient verbalized understanding and is agreeable with plan.  Final Clinical Impressions(s) / UC Diagnoses   Final diagnoses:  Lower abdominal pain     Discharge Instructions     Pregnancy test negative Urine without infection, did appear concentrated- drink plenty of fluids STD screening pending Naprosyn twice daily as needed for cramping Follow up with OBGYN as planned Return if pain changing or worsening   ED Prescriptions    Medication Sig Dispense Auth. Provider   naproxen (NAPROSYN) 500 MG tablet Take 1 tablet (500 mg total) by mouth 2 (two) times daily. 30 tablet Aprill Banko, [place] C, PA-C     PDMP not reviewed this encounter.   Lew Dawes, [place] 08/06/20  [phone]

## 2020-08-08 LAB — CERVICOVAGINAL ANCILLARY ONLY
Bacterial Vaginitis (gardnerella): POSITIVE — AB
Candida Glabrata: NEGATIVE
Candida Vaginitis: NEGATIVE
Chlamydia: POSITIVE — AB
Comment: NEGATIVE
Comment: NEGATIVE
Comment: NEGATIVE
Comment: NEGATIVE
Comment: NEGATIVE
Comment: NORMAL
Neisseria Gonorrhea: NEGATIVE
Trichomonas: NEGATIVE

## 2020-08-09 ENCOUNTER — Telehealth: Payer: Self-pay

## 2020-08-09 MED ORDER — AZITHROMYCIN 250 MG PO TABS: 1000.0000 mg | ORAL_TABLET | Freq: Once | ORAL | 0 refills | Status: AC

## 2020-08-09 MED ORDER — METRONIDAZOLE 500 MG PO TABS: 500.0000 mg | ORAL_TABLET | Freq: Two times a day (BID) | ORAL | 0 refills | Status: DC

## 2020-09-18 DIAGNOSIS — N939 Abnormal uterine and vaginal bleeding, unspecified: Secondary | ICD-10-CM | POA: Diagnosis not present

## 2020-09-19 ENCOUNTER — Other Ambulatory Visit: Payer: Self-pay

## 2020-09-19 ENCOUNTER — Encounter (HOSPITAL_COMMUNITY): Payer: Self-pay | Admitting: Emergency Medicine

## 2020-09-19 ENCOUNTER — Emergency Department (HOSPITAL_COMMUNITY)
Admission: EM | Admit: 2020-09-19 | Discharge: 2020-09-19 | Disposition: A | Discharge status: Home / Self Care | Payer: BLUE CROSS/BLUE SHIELD | Attending: Emergency Medicine | Admitting: Emergency Medicine

## 2020-09-19 DIAGNOSIS — N939 Abnormal uterine and vaginal bleeding, unspecified: Secondary | ICD-10-CM

## 2020-09-19 LAB — CBC WITH DIFFERENTIAL/PLATELET
Abs Immature Granulocytes: 0.02 10*3/uL (ref 0.00–0.07)
Basophils Absolute: 0 10*3/uL (ref 0.0–0.1)
Basophils Relative: 0 %
Eosinophils Absolute: 0.2 10*3/uL (ref 0.0–0.5)
Eosinophils Relative: 2 %
HCT: 30.4 % — ABNORMAL LOW (ref 36.0–46.0)
Hemoglobin: 9.2 g/dL — ABNORMAL LOW (ref 12.0–15.0)
Immature Granulocytes: 0 %
Lymphocytes Relative: 53 %
Lymphs Abs: 3.6 10*3/uL (ref 0.7–4.0)
MCH: 23.4 pg — ABNORMAL LOW (ref 26.0–34.0)
MCHC: 30.3 g/dL (ref 30.0–36.0)
MCV: 77.2 fL — ABNORMAL LOW (ref 80.0–100.0)
Monocytes Absolute: 0.6 10*3/uL (ref 0.1–1.0)
Monocytes Relative: 9 %
Neutro Abs: 2.5 10*3/uL (ref 1.7–7.7)
Neutrophils Relative %: 36 %
Platelets: 232 10*3/uL (ref 150–400)
RBC: 3.94 MIL/uL (ref 3.87–5.11)
RDW: 15.9 % — ABNORMAL HIGH (ref 11.5–15.5)
WBC: 6.8 10*3/uL (ref 4.0–10.5)
nRBC: 0 % (ref 0.0–0.2)

## 2020-09-19 LAB — WET PREP, GENITAL
Clue Cells Wet Prep HPF POC: NONE SEEN
Sperm: NONE SEEN
Trich, Wet Prep: NONE SEEN
WBC, Wet Prep HPF POC: NONE SEEN
Yeast Wet Prep HPF POC: NONE SEEN

## 2020-09-19 LAB — I-STAT BETA HCG BLOOD, ED (MC, WL, AP ONLY): I-stat hCG, quantitative: <5 m[IU]/mL (ref <5)

## 2020-09-19 NOTE — Discharge Instructions (Signed)
Follow-up with their primary doctor or GYN if symptoms not improving in the next week.  We will call you if your cultures indicate you require further treatment or need to take additional action.

## 2020-09-19 NOTE — ED Provider Notes (Signed)
[facility] Provider Note   CSN: [id number] Arrival date & time: 09/18/20  2350     History Chief Complaint  Patient presents with  . Vaginal Bleeding    April Wallace is a 24 y.o. female.  Patient is a 24 year old female with history of prior miscarriage, anemia.  She presents today for evaluation of vaginal spotting.  This is been ongoing for the past several days.  She denies any pain or discomfort.  She was seen at an outside facility yesterday with similar complaints.  She had a pregnancy test and urinalysis, both of which were unremarkable.  She presents here today with ongoing spotting.  She denies any dizziness or lightheadedness.  She denies any fevers, chills, or pain.  She is sexually active, however it has been 2 months since her last encounter.  She denies the possibility of pregnancy.  She denies any vaginal discharge.  Her last menstrual period finished up 1 week prior to the spotting she is experiencing now.  The history is provided by the patient.  Vaginal Bleeding Quality:  Bright red Severity:  Mild Onset quality:  Gradual Duration:  4 days Timing:  Intermittent Progression:  Unchanged Chronicity:  New Relieved by:  Nothing Worsened by:  Nothing Ineffective treatments:  None tried      Past Medical History:  Diagnosis Date  . Headache     Patient Active Problem List   Diagnosis Date Noted  . Anemia affecting pregnancy in first trimester 11/12/2017  . Bacterial vaginitis 10/30/2017  . Candida vaginitis 10/30/2017  . Abdominal cramping affecting pregnancy 10/30/2017  . Morning sickness 10/16/2017    Past Surgical History:  Procedure Laterality Date  . INDUCED ABORTION    . WISDOM TOOTH EXTRACTION       OB History    Gravida  3   Para      Term      Preterm      AB  2   Living        SAB  1   TAB      Ectopic      Multiple      Live Births              Family History  Problem  Relation Age of Onset  . Diabetes Maternal Grandmother   . Seizures Maternal Grandmother   . Asthma Maternal Grandmother   . Cancer Maternal Grandfather     Social History   Tobacco Use  . Smoking status: Never Smoker  . Smokeless tobacco: Never Used  Substance Use Topics  . Alcohol use: No  . Drug use: No    Home Medications Prior to Admission medications   Medication Sig Start Date End Date Taking? Authorizing Provider  acetaminophen (TYLENOL) 325 MG tablet Take 650 mg by mouth every 6 (six) hours as needed for headache.    [provider]  desonide (DESOWEN) 0.05 % ointment Apply 1 application topically 2 (two) times daily. Apply to the hands 03/29/18   Arthor Captain, PA-C  ferrous sulfate 325 (65 FE) MG tablet Take 1 tablet (325 mg total) by mouth daily with breakfast for 14 days. 12/06/18 12/20/18  Shaune Pollack, MD  ibuprofen (ADVIL,MOTRIN) 600 MG tablet Take 1 tablet (600 mg total) by mouth every 8 (eight) hours as needed for moderate pain. 12/06/18   Shaune Pollack, MD  ketoconazole (NIZORAL) 2 % cream Apply 1 application topically daily. Apply to the vaginal region. 03/29/18  Harris, Abigail, PA-C  metroNIDAZOLE (FLAGYL) 500 MG tablet Take 1 tablet (500 mg total) by mouth 2 (two) times daily. 08/09/20   Lamptey, Britta Mccreedy, MD  naproxen (NAPROSYN) 500 MG tablet Take 1 tablet (500 mg total) by mouth 2 (two) times daily. 08/05/20   Wieters, Hallie C, PA-C  ondansetron (ZOFRAN-ODT) 4 MG disintegrating tablet Take 1 tablet (4 mg total) by mouth every 8 (eight) hours as needed for nausea or vomiting. 11/19/17   Pincus Large, DO    Allergies    Patient has no known allergies.  Review of Systems   Review of Systems  Genitourinary: Positive for vaginal bleeding.  All other systems reviewed and are negative.   Physical Exam Updated Vital Signs BP 108/77 (BP Location: Left Arm)   Pulse 79   Temp 98.4 F (36.9 C) (Oral)   Resp 18   Ht 5\' 4"  (1.626 m)   Wt 61.2 kg    LMP 09/07/2020   SpO2 100%   BMI 23.17 kg/m   Physical Exam Vitals and nursing note reviewed.  Constitutional:      General: She is not in acute distress.    Appearance: She is well-developed. She is not diaphoretic.  HENT:     Head: Normocephalic and atraumatic.  Cardiovascular:     Rate and Rhythm: Normal rate and regular rhythm.     Heart sounds: No murmur heard.  No friction rub. No gallop.   Pulmonary:     Effort: Pulmonary effort is normal. No respiratory distress.     Breath sounds: Normal breath sounds. No wheezing.  Abdominal:     General: Bowel sounds are normal. There is no distension.     Palpations: Abdomen is soft.     Tenderness: There is no abdominal tenderness.  Genitourinary:    General: Normal vulva.     Vagina: No vaginal discharge.     Comments: Pelvic exam is unremarkable.  There is not any any bleeding or blood noted in the vaginal vault. Musculoskeletal:        General: Normal range of motion.     Cervical back: Normal range of motion and neck supple.  Skin:    General: Skin is warm and dry.  Neurological:     Mental Status: She is alert and oriented to person, place, and time.     ED Results / Procedures / Treatments   Labs (all labs ordered are listed, but only abnormal results are displayed) Labs Reviewed  WET PREP, GENITAL  CBC WITH DIFFERENTIAL/PLATELET  I-STAT BETA HCG BLOOD, ED ([facility], [facility], [facility] ONLY)  GC/CHLAMYDIA PROBE AMP ([place]) NOT AT [facility]    EKG None  Radiology No results found.  Procedures Procedures (including critical care time)  Medications Ordered in ED Medications - No data to display  ED Course  I have reviewed the triage vital signs and the nursing notes.  Pertinent labs & imaging results that were available during my care of the patient were reviewed by me and considered in my medical decision making (see chart for details).    MDM Rules/Calculators/A&P  Patient presenting here with vaginal spotting  for the past few days.  She is not experiencing any discomfort and pregnancy test is negative.  Wet prep is unremarkable.  GC and Chlamydia cultures pending.  Patient's hemoglobin is consistent with baseline.  I see no indication at this time for any further work-up.  Patient will be discharged pending GC/Chlamydia cultures.  To return as needed  for any problems.  Final Clinical Impression(s) / ED Diagnoses Final diagnoses:  None    Rx / DC Orders ED Discharge Orders    None       Geoffery Lyons, MD 09/19/20 [phone]

## 2020-09-19 NOTE — ED Triage Notes (Signed)
Pt reports having spotting that started on 09/17/20. Pt reports hx of anemia. Pt states that a liner is able to hold spotting at this time.

## 2020-09-20 ENCOUNTER — Encounter (HOSPITAL_COMMUNITY): Payer: Self-pay

## 2020-09-20 ENCOUNTER — Other Ambulatory Visit: Payer: Self-pay

## 2020-09-20 ENCOUNTER — Ambulatory Visit (HOSPITAL_COMMUNITY)
Admission: EM | Admit: 2020-09-20 | Discharge: 2020-09-20 | Disposition: A | Discharge status: Home / Self Care | Payer: BLUE CROSS/BLUE SHIELD | Attending: Family Medicine | Admitting: Family Medicine

## 2020-09-20 DIAGNOSIS — J029 Acute pharyngitis, unspecified: Secondary | ICD-10-CM

## 2020-09-20 DIAGNOSIS — N898 Other specified noninflammatory disorders of vagina: Secondary | ICD-10-CM

## 2020-09-20 DIAGNOSIS — Z20822 Contact with and (suspected) exposure to covid-19: Secondary | ICD-10-CM | POA: Diagnosis not present

## 2020-09-20 LAB — POCT RAPID STREP A, ED / UC: Streptococcus, Group A Screen (Direct): NEGATIVE

## 2020-09-20 LAB — SARS CORONAVIRUS 2 (TAT 6-24 HRS): SARS Coronavirus 2: NEGATIVE

## 2020-09-20 LAB — GC/CHLAMYDIA PROBE AMP (~~LOC~~) NOT AT ARMC
Chlamydia: NEGATIVE
Comment: NEGATIVE
Comment: NORMAL
Neisseria Gonorrhea: NEGATIVE

## 2020-09-20 MED ORDER — LIDOCAINE VISCOUS HCL 2 % MT SOLN: 10.0000 mL | OROMUCOSAL | 0 refills | Status: DC | PRN

## 2020-09-20 NOTE — ED Triage Notes (Signed)
Pt c/o sore throat onset last night.   Also c/o vaginal irritation and discharge onset 10/2. Pt states she began using a new soap 09/30 (Dove soap with pomegranate).   Denies n/v/d, fever, chills, congestion, HA, runny nose, cough, SOB, dysuria sx.

## 2020-09-20 NOTE — ED Provider Notes (Signed)
[facility]    CSN: [id number] Arrival date & time: 09/20/20  1323      History   Chief Complaint Chief Complaint  Patient presents with  . Vaginitis  . Sore Throat    HPI April Wallace is a 24 y.o. female.   Patient presenting today following up for concerns of some light brown discharge she's been experiencing the past few days to a week. Also having some vaginal irritation associated but otherwise no rashes, dysuria, hematuria, itching, pelvic or abdominal pain, fever. Was positive for chlamydia and BV last month, treated but states she lost a few of the pills for chlamydia so could not complete course. Went to ED for this last night, vaginal swab was neg for infections including gonorrhea and chlamydia. Not trying anything OTC for sxs.  Also started with a sore throat x 1 day. No other sxs associated at this time and no known sick contacts. Not trying anything OTC for this.      Past Medical History:  Diagnosis Date  . Headache     Patient Active Problem List   Diagnosis Date Noted  . Anemia affecting pregnancy in first trimester 11/12/2017  . Bacterial vaginitis 10/30/2017  . Candida vaginitis 10/30/2017  . Abdominal cramping affecting pregnancy 10/30/2017  . Morning sickness 10/16/2017    Past Surgical History:  Procedure Laterality Date  . INDUCED ABORTION    . WISDOM TOOTH EXTRACTION      OB History    Gravida  3   Para      Term      Preterm      AB  2   Living        SAB  1   TAB      Ectopic      Multiple      Live Births               Home Medications    Prior to Admission medications   Medication Sig Start Date End Date Taking? Authorizing Provider  acetaminophen (TYLENOL) 325 MG tablet Take 650 mg by mouth every 6 (six) hours as needed for headache.    [provider]  desonide (DESOWEN) 0.05 % ointment Apply 1 application topically 2 (two) times daily. Apply to the hands 03/29/18   Arthor Captain, PA-C  ferrous sulfate 325 (65 FE) MG tablet Take 1 tablet (325 mg total) by mouth daily with breakfast for 14 days. 12/06/18 12/20/18  Shaune Pollack, MD  ibuprofen (ADVIL,MOTRIN) 600 MG tablet Take 1 tablet (600 mg total) by mouth every 8 (eight) hours as needed for moderate pain. 12/06/18   Shaune Pollack, MD  ketoconazole (NIZORAL) 2 % cream Apply 1 application topically daily. Apply to the vaginal region. 03/29/18   Harris, Cammy Copa, PA-C  lidocaine (XYLOCAINE) 2 % solution Use as directed 10 mLs in the mouth or throat as needed for mouth pain. 09/20/20   Particia Nearing, PA-C  metroNIDAZOLE (FLAGYL) 500 MG tablet Take 1 tablet (500 mg total) by mouth 2 (two) times daily. 08/09/20   Lamptey, Britta Mccreedy, MD  naproxen (NAPROSYN) 500 MG tablet Take 1 tablet (500 mg total) by mouth 2 (two) times daily. 08/05/20   Wieters, Hallie C, PA-C  ondansetron (ZOFRAN-ODT) 4 MG disintegrating tablet Take 1 tablet (4 mg total) by mouth every 8 (eight) hours as needed for nausea or vomiting. 11/19/17   Pincus Large, DO    Family History Family History  Problem  Relation Age of Onset  . Diabetes Maternal Grandmother   . Seizures Maternal Grandmother   . Asthma Maternal Grandmother   . Cancer Maternal Grandfather     Social History Social History   Tobacco Use  . Smoking status: Never Smoker  . Smokeless tobacco: Never Used  Substance Use Topics  . Alcohol use: No  . Drug use: No     Allergies   Patient has no known allergies.   Review of Systems Review of Systems PER HPI    Physical Exam Triage Vital Signs ED Triage Vitals  Enc Vitals Group     BP 09/20/20 1547 113/76     Pulse Rate 09/20/20 1547 77     Resp 09/20/20 1547 15     Temp 09/20/20 1547 98.5 F (36.9 C)     Temp Source 09/20/20 1547 Oral     SpO2 09/20/20 1547 100 %     Weight --      Height --      Head Circumference --      Peak Flow --      Pain Score 09/20/20 1545 3     Pain Loc --      Pain Edu?  --      Excl. in GC? --    No data found.  Updated Vital Signs BP 113/76 (BP Location: Right Arm)   Pulse 77   Temp 98.5 F (36.9 C) (Oral)   Resp 15   LMP 09/07/2020   SpO2 100%   Visual Acuity Right Eye Distance:   Left Eye Distance:   Bilateral Distance:    Right Eye Near:   Left Eye Near:    Bilateral Near:     Physical Exam Vitals and nursing note reviewed.  Constitutional:      Appearance: Normal appearance. She is not ill-appearing.  HENT:     Head: Atraumatic.     Nose: Nose normal.     Mouth/Throat:     Mouth: Mucous membranes are moist.     Pharynx: Posterior oropharyngeal erythema (and edema b/l tonsils, cryptic but no exudates) present.  Eyes:     Extraocular Movements: Extraocular movements intact.     Conjunctiva/sclera: Conjunctivae normal.  Cardiovascular:     Rate and Rhythm: Normal rate and regular rhythm.     Heart sounds: Normal heart sounds.  Pulmonary:     Effort: Pulmonary effort is normal.     Breath sounds: Normal breath sounds.  Abdominal:     General: Bowel sounds are normal. There is no distension.     Palpations: Abdomen is soft.     Tenderness: There is no abdominal tenderness. There is no right CVA tenderness, left CVA tenderness or guarding.  Genitourinary:    Comments: GU exam declined today Musculoskeletal:        General: Normal range of motion.     Cervical back: Normal range of motion and neck supple.  Lymphadenopathy:     Cervical: Cervical adenopathy (diffuse) present.  Skin:    General: Skin is warm and dry.  Neurological:     Mental Status: She is alert and oriented to person, place, and time.  Psychiatric:        Mood and Affect: Mood normal.        Thought Content: Thought content normal.        Judgment: Judgment normal.    UC Treatments / Results  Labs (all labs ordered are listed, but only abnormal results are displayed)  Labs Reviewed  SARS CORONAVIRUS 2 (TAT 6-24 HRS)  CULTURE, GROUP A STREP [facility])    POCT RAPID STREP A, ED / UC    EKG   Radiology No results found.  Procedures Procedures (including critical care time)  Medications Ordered in UC Medications - No data to display  Initial Impression / Assessment and Plan / UC Course  I have reviewed the triage vital signs and the nursing notes.  Pertinent labs & imaging results that were available during my care of the patient were reviewed by me and considered in my medical decision making (see chart for details).     She declines vaginal swab today which is reasonable as she just had one in ED last night - reviewed results in Epic - all negative including STIs tested for. Reassurance given, and discussed good vaginal hygiene, probiotics, boric acid for irritation and balance. F/u with PCP if sxs worsening again.  Rapid strep neg, throat culture and COVID swab pending. Viscous lidocaine sent for symptomatic relief. OTC pain relievers, salt water gargles reviewed. Isolate until results return.   Final Clinical Impressions(s) / UC Diagnoses   Final diagnoses:  Pharyngitis, unspecified etiology  Vaginal discharge   Discharge Instructions   None    ED Prescriptions    Medication Sig Dispense Auth. Provider   lidocaine (XYLOCAINE) 2 % solution Use as directed 10 mLs in the mouth or throat as needed for mouth pain. 100 mL Particia Nearing, [place]     PDMP not reviewed this encounter.   Particia Nearing, [place] 09/20/20 1659

## 2020-09-23 LAB — CULTURE, GROUP A STREP (THRC)

## 2020-10-24 ENCOUNTER — Other Ambulatory Visit: Payer: Self-pay

## 2020-10-24 ENCOUNTER — Ambulatory Visit (HOSPITAL_COMMUNITY)
Admission: EM | Admit: 2020-10-24 | Discharge: 2020-10-24 | Disposition: A | Discharge status: Home / Self Care | Payer: BLUE CROSS/BLUE SHIELD | Attending: Family Medicine | Admitting: Family Medicine

## 2020-10-24 ENCOUNTER — Encounter (HOSPITAL_COMMUNITY): Payer: Self-pay | Admitting: Emergency Medicine

## 2020-10-24 DIAGNOSIS — Z79899 Other long term (current) drug therapy: Secondary | ICD-10-CM | POA: Insufficient documentation

## 2020-10-24 DIAGNOSIS — B373 Candidiasis of vulva and vagina: Secondary | ICD-10-CM

## 2020-10-24 DIAGNOSIS — M545 Low back pain, unspecified: Secondary | ICD-10-CM | POA: Diagnosis not present

## 2020-10-24 DIAGNOSIS — R102 Pelvic and perineal pain: Secondary | ICD-10-CM | POA: Insufficient documentation

## 2020-10-24 DIAGNOSIS — B3731 Acute candidiasis of vulva and vagina: Secondary | ICD-10-CM

## 2020-10-24 LAB — POCT URINALYSIS DIPSTICK, ED / UC
Bilirubin Urine: NEGATIVE
Glucose, UA: NEGATIVE mg/dL
Hgb urine dipstick: NEGATIVE
Leukocytes,Ua: NEGATIVE
Nitrite: NEGATIVE
Protein, ur: NEGATIVE mg/dL
Specific Gravity, Urine: >=1.03 (ref 1.005–1.030)
Urobilinogen, UA: 0.2 mg/dL (ref 0.0–1.0)
pH: 6.5 (ref 5.0–8.0)

## 2020-10-24 LAB — POC URINE PREG, ED: Preg Test, Ur: NEGATIVE

## 2020-10-24 MED ORDER — FLUCONAZOLE 150 MG PO TABS: 150.0000 mg | ORAL_TABLET | Freq: Every day | ORAL | 0 refills | Status: DC

## 2020-10-24 NOTE — Discharge Instructions (Addendum)
Take medication as prescribed. You have been screened today for sexually transmitted infections. Those results should be available via MyChart in the next 2 to 3 days. Someone will follow up with you if further treatment is needed. Please return for worsening symptoms

## 2020-10-24 NOTE — ED Triage Notes (Signed)
Back and lower abdominal pain started 2-3 days ago.  No abnormal vaginal discharge.  Denies burning with urination.  Last bm was yesterday.  Denies vomiting

## 2020-10-24 NOTE — ED Provider Notes (Signed)
[facility]    CSN: [id number] Arrival date & time: 10/24/20  1926      History   Chief Complaint Chief Complaint  Patient presents with  . Back Pain  . Abdominal Pain    HPI April Wallace is a 24 y.o. female otherwise healthy who presents to urgent care with complaints of 2 to 3-day history of aching sensation in pelvic area and low back. Patient reports similar symptoms in the past with STI. However patient reports she has not been sexually active for several months with negative STI screening done last month. Patient denies any abnormal bleeding or vaginal discharge, no dysuria, abdominal pain, N/V/D. No recent fever or chills  Past Medical History:  Diagnosis Date  . Headache     Patient Active Problem List   Diagnosis Date Noted  . Anemia affecting pregnancy in first trimester 11/12/2017  . Bacterial vaginitis 10/30/2017  . Candida vaginitis 10/30/2017  . Abdominal cramping affecting pregnancy 10/30/2017  . Morning sickness 10/16/2017    Past Surgical History:  Procedure Laterality Date  . INDUCED ABORTION    . WISDOM TOOTH EXTRACTION      OB History    Gravida  3   Para      Term      Preterm      AB  2   Living        SAB  1   TAB      Ectopic      Multiple      Live Births               Home Medications    Prior to Admission medications   Medication Sig Start Date End Date Taking? Authorizing Provider  naproxen (NAPROSYN) 500 MG tablet Take 1 tablet (500 mg total) by mouth 2 (two) times daily. 08/05/20  Yes Wieters, Hallie C, PA-C  acetaminophen (TYLENOL) 325 MG tablet Take 650 mg by mouth every 6 (six) hours as needed for headache.    [provider]  desonide (DESOWEN) 0.05 % ointment Apply 1 application topically 2 (two) times daily. Apply to the hands 03/29/18   Arthor Captain, PA-C  ferrous sulfate 325 (65 FE) MG tablet Take 1 tablet (325 mg total) by mouth daily with breakfast for 14 days. 12/06/18  12/20/18  Shaune Pollack, MD  fluconazole (DIFLUCAN) 150 MG tablet Take 1 tablet (150 mg total) by mouth daily. Take 1 tablet by mouth once. may repeat in 3 days if symptoms persist 10/24/20   Rolla Etienne, NP  ibuprofen (ADVIL,MOTRIN) 600 MG tablet Take 1 tablet (600 mg total) by mouth every 8 (eight) hours as needed for moderate pain. 12/06/18   Shaune Pollack, MD  ketoconazole (NIZORAL) 2 % cream Apply 1 application topically daily. Apply to the vaginal region. 03/29/18   Harris, Cammy Copa, PA-C  lidocaine (XYLOCAINE) 2 % solution Use as directed 10 mLs in the mouth or throat as needed for mouth pain. 09/20/20   Particia Nearing, PA-C  metroNIDAZOLE (FLAGYL) 500 MG tablet Take 1 tablet (500 mg total) by mouth 2 (two) times daily. 08/09/20   Lamptey, Britta Mccreedy, MD  ondansetron (ZOFRAN-ODT) 4 MG disintegrating tablet Take 1 tablet (4 mg total) by mouth every 8 (eight) hours as needed for nausea or vomiting. 11/19/17   Pincus Large, DO    Family History Family History  Problem Relation Age of Onset  . Diabetes Maternal Grandmother   . Seizures Maternal Grandmother   .  Asthma Maternal Grandmother   . Cancer Maternal Grandfather     Social History Social History   Tobacco Use  . Smoking status: Never Smoker  . Smokeless tobacco: Never Used  Substance Use Topics  . Alcohol use: No  . Drug use: No     Allergies   Patient has no known allergies.   Review of Systems As stated in HPI otherwise negative   Physical Exam Triage Vital Signs ED Triage Vitals  Enc Vitals Group     BP 10/24/20 2028 105/69     Pulse Rate 10/24/20 2028 91     Resp 10/24/20 2028 16     Temp 10/24/20 2028 98.6 F (37 C)     Temp Source 10/24/20 2028 Oral     SpO2 10/24/20 2028 100 %     Weight --      Height --      Head Circumference --      Peak Flow --      Pain Score 10/24/20 2025 4     Pain Loc --      Pain Edu? --      Excl. in GC? --    No data found.  Updated Vital Signs BP  105/69 (BP Location: Right Arm)   Pulse 91   Temp 98.6 F (37 C) (Oral)   Resp 16   SpO2 100%   Visual Acuity Right Eye Distance:   Left Eye Distance:   Bilateral Distance:    Right Eye Near:   Left Eye Near:    Bilateral Near:     Physical Exam Constitutional:      General: She is not in acute distress.    Appearance: She is well-developed and normal weight. She is not ill-appearing or toxic-appearing.  Abdominal:     General: Abdomen is flat. Bowel sounds are normal. There is no distension.     Palpations: Abdomen is soft. There is no mass.     Tenderness: There is no abdominal tenderness. There is no right CVA tenderness, left CVA tenderness, guarding or rebound. Negative signs include Murphy's sign and McBurney's sign.     Hernia: No hernia is present.  Genitourinary:    Vagina: Vaginal discharge and tenderness present.     Cervix: No cervical motion tenderness.     Adnexa: Right adnexa normal and left adnexa normal.       Right: No tenderness.         Left: No tenderness.       Comments: Pelvic exam with tenderness along vaginal walls and thick white discharge. No suspicious lesions, no erythema, no odor. No cervical motion tenderness Skin:    General: Skin is warm and dry.  Neurological:     Mental Status: She is alert.  Psychiatric:        Mood and Affect: Mood normal.        Behavior: Behavior normal.      UC Treatments / Results  Labs (all labs ordered are listed, but only abnormal results are displayed) Labs Reviewed  POCT URINALYSIS DIPSTICK, ED / UC - Abnormal; Notable for the following components:      Result Value   Ketones, ur TRACE (*)    All other components within normal limits  POC URINE PREG, ED  GC/CHLAMYDIA PROBE AMP () NOT AT [facility]    EKG   Radiology No results found.  Procedures Procedures (including critical care time)  Medications Ordered in UC Medications - No data  to display  Initial Impression / Assessment and  Plan / UC Course  I have reviewed the triage vital signs and the nursing notes.  Pertinent labs & imaging results that were available during my care of the patient were reviewed by me and considered in my medical decision making (see chart for details).  Yeast vaginitis -Seen on pelvic exam -UA and Upreg unremarkable -Diflucan 150 mg p.o. x1. Okay to repeat if symptoms persist -STI screening. Follow-up results  Reviewed expections re: course of current medical issues. Questions answered. Outlined signs and symptoms indicating need for more acute intervention. Pt verbalized understanding. AVS given  Final Clinical Impressions(s) / UC Diagnoses   Final diagnoses:  Vulvovaginitis due to yeast     Discharge Instructions     Take medication as prescribed. You have been screened today for sexually transmitted infections. Those results should be available via MyChart in the next 2 to 3 days. Someone will follow up with you if further treatment is needed. Please return for worsening symptoms    ED Prescriptions    Medication Sig Dispense Auth. Provider   fluconazole (DIFLUCAN) 150 MG tablet Take 1 tablet (150 mg total) by mouth daily. Take 1 tablet by mouth once. may repeat in 3 days if symptoms persist 2 tablet Rolla Etienne, NP     PDMP not reviewed this encounter.   Rolla Etienne, NP 10/24/20 2151

## 2020-10-26 LAB — GC/CHLAMYDIA PROBE AMP (~~LOC~~) NOT AT ARMC
Chlamydia: NEGATIVE
Comment: NEGATIVE
Comment: NORMAL
Neisseria Gonorrhea: NEGATIVE

## 2020-12-28 ENCOUNTER — Encounter (HOSPITAL_COMMUNITY): Payer: Self-pay

## 2020-12-28 ENCOUNTER — Ambulatory Visit (HOSPITAL_COMMUNITY)
Admission: EM | Admit: 2020-12-28 | Discharge: 2020-12-28 | Disposition: A | Discharge status: Home / Self Care | Payer: BLUE CROSS/BLUE SHIELD | Attending: Urgent Care | Admitting: Urgent Care

## 2020-12-28 DIAGNOSIS — N946 Dysmenorrhea, unspecified: Secondary | ICD-10-CM | POA: Diagnosis not present

## 2020-12-28 DIAGNOSIS — N939 Abnormal uterine and vaginal bleeding, unspecified: Secondary | ICD-10-CM | POA: Insufficient documentation

## 2020-12-28 DIAGNOSIS — R1030 Lower abdominal pain, unspecified: Secondary | ICD-10-CM | POA: Diagnosis present

## 2020-12-28 LAB — POC URINE PREG, ED: Preg Test, Ur: NEGATIVE

## 2020-12-28 MED ORDER — NAPROXEN 500 MG PO TABS: 500.0000 mg | ORAL_TABLET | Freq: Two times a day (BID) | ORAL | 0 refills | Status: DC

## 2020-12-28 NOTE — ED Triage Notes (Signed)
Pt presents with generalized abdominal pain X 2 days. 

## 2020-12-28 NOTE — ED Provider Notes (Signed)
[facility]   MRN: [id number] DOB: [birth year]  Subjective:   April Wallace is a 25 y.o. female presenting for 2-day history of acute onset mild to moderate intermittent lower abdominal pain and cramps.  Patient is currently on her cycle.  She does admit that she has a history of heavy menstrual bleeding, menstrual clamps.  She has previously tried Depo injections and OCP which worsened her symptoms.  She has not followed with an OB gynecologist recently.  Would like recommendations of where to go.  She is sexually active, has 1 partner, uses condoms for protection.  Would like to make sure she does not have an STI.  She does have a history of bacterial vaginosis and yeast infections.  No current facility-administered medications for this encounter.  Current Outpatient Medications:  .  acetaminophen (TYLENOL) 325 MG tablet, Take 650 mg by mouth every 6 (six) hours as needed for headache., Disp: , Rfl:  .  desonide (DESOWEN) 0.05 % ointment, Apply 1 application topically 2 (two) times daily. Apply to the hands, Disp: 15 g, Rfl: 2 .  ferrous sulfate 325 (65 FE) MG tablet, Take 1 tablet (325 mg total) by mouth daily with breakfast for 14 days., Disp: 14 tablet, Rfl: 0 .  fluconazole (DIFLUCAN) 150 MG tablet, Take 1 tablet (150 mg total) by mouth daily. Take 1 tablet by mouth once. may repeat in 3 days if symptoms persist, Disp: 2 tablet, Rfl: 0 .  ibuprofen (ADVIL,MOTRIN) 600 MG tablet, Take 1 tablet (600 mg total) by mouth every 8 (eight) hours as needed for moderate pain., Disp: 30 tablet, Rfl: 0 .  ketoconazole (NIZORAL) 2 % cream, Apply 1 application topically daily. Apply to the vaginal region., Disp: 30 g, Rfl: 0 .  lidocaine (XYLOCAINE) 2 % solution, Use as directed 10 mLs in the mouth or throat as needed for mouth pain., Disp: 100 mL, Rfl: 0 .  metroNIDAZOLE (FLAGYL) 500 MG tablet, Take 1 tablet (500 mg total) by mouth 2 (two) times daily., Disp: 14 tablet, Rfl:  0 .  naproxen (NAPROSYN) 500 MG tablet, Take 1 tablet (500 mg total) by mouth 2 (two) times daily., Disp: 30 tablet, Rfl: 0 .  ondansetron (ZOFRAN-ODT) 4 MG disintegrating tablet, Take 1 tablet (4 mg total) by mouth every 8 (eight) hours as needed for nausea or vomiting., Disp: 30 tablet, Rfl: 0   No Known Allergies  Past Medical History:  Diagnosis Date  . Headache      Past Surgical History:  Procedure Laterality Date  . INDUCED ABORTION    . WISDOM TOOTH EXTRACTION      Family History  Problem Relation Age of Onset  . Diabetes Maternal Grandmother   . Seizures Maternal Grandmother   . Asthma Maternal Grandmother   . Cancer Maternal Grandfather     Social History   Tobacco Use  . Smoking status: Never Smoker  . Smokeless tobacco: Never Used  Substance Use Topics  . Alcohol use: No  . Drug use: No    ROS   Objective:   Vitals: BP 115/66 (BP Location: Right Arm)   Pulse 62   Temp 98.1 F (36.7 C) (Oral)   Resp 18   SpO2 97%   Physical Exam Constitutional:      General: She is not in acute distress.    Appearance: Normal appearance. She is well-developed. She is not ill-appearing.  HENT:     Head: Normocephalic and atraumatic.  Nose: Nose normal.     Mouth/Throat:     Mouth: Mucous membranes are moist.     Pharynx: Oropharynx is clear.  Eyes:     General: No scleral icterus.    Extraocular Movements: Extraocular movements intact.     Pupils: Pupils are equal, round, and reactive to light.  Cardiovascular:     Rate and Rhythm: Normal rate.  Pulmonary:     Effort: Pulmonary effort is normal.  Abdominal:     General: There is no distension.     Palpations: Abdomen is soft.     Tenderness: There is abdominal tenderness in the suprapubic area. There is no right CVA tenderness, left CVA tenderness, guarding or rebound.  Skin:    General: Skin is warm and dry.  Neurological:     General: No focal deficit present.     Mental Status: She is alert and  oriented to person, place, and time.  Psychiatric:        Mood and Affect: Mood normal.        Behavior: Behavior normal.     Results for orders placed or performed during the hospital encounter of 12/28/20 (from the past 24 hour(s))  POC urine pregnancy     Status: None   Collection Time: 12/28/20  6:56 PM  Result Value Ref Range   Preg Test, Ur NEGATIVE NEGATIVE    Assessment and Plan :   PDMP not reviewed this encounter.  1. Lower abdominal pain   2. Dysmenorrhea   3. Abnormal vaginal bleeding     Suspect abdominal pain due to dysmenorrhea.  Recommended naproxen for pain inflammation we will treat as appropriate based off of the cervical spine.  Establish care with a new gynecologist at the [facility]. Counseled patient on potential for adverse effects with medications prescribed/recommended today, ER and return-to-clinic precautions discussed, patient verbalized understanding.    Wallis Bamberg, [place] 12/28/20 1905

## 2020-12-29 LAB — CERVICOVAGINAL ANCILLARY ONLY
Bacterial Vaginitis (gardnerella): NEGATIVE
Candida Glabrata: NEGATIVE
Candida Vaginitis: NEGATIVE
Chlamydia: NEGATIVE
Comment: NEGATIVE
Comment: NEGATIVE
Comment: NEGATIVE
Comment: NEGATIVE
Comment: NEGATIVE
Comment: NORMAL
Neisseria Gonorrhea: NEGATIVE
Trichomonas: NEGATIVE

## 2021-01-10 ENCOUNTER — Other Ambulatory Visit: Payer: Self-pay

## 2021-01-10 ENCOUNTER — Encounter (HOSPITAL_COMMUNITY): Payer: Self-pay | Admitting: Emergency Medicine

## 2021-01-10 ENCOUNTER — Ambulatory Visit (HOSPITAL_COMMUNITY)
Admission: EM | Admit: 2021-01-10 | Discharge: 2021-01-10 | Disposition: A | Discharge status: Home / Self Care | Payer: BLUE CROSS/BLUE SHIELD | Attending: Family Medicine | Admitting: Family Medicine

## 2021-01-10 DIAGNOSIS — N760 Acute vaginitis: Secondary | ICD-10-CM | POA: Diagnosis present

## 2021-01-10 MED ORDER — FLUCONAZOLE 150 MG PO TABS: 150.0000 mg | ORAL_TABLET | Freq: Once | ORAL | 0 refills | Status: AC

## 2021-01-10 NOTE — ED Provider Notes (Signed)
[facility]    CSN: [id number] Arrival date & time: 01/10/21  1741      History   Chief Complaint Chief Complaint  Patient presents with  . Vaginal Itching  . Vaginal Discharge    HPI April Wallace is a 25 y.o. female.   Here today with 2 day history of white vaginal discharge, irritation, itching. Denies fever, chills, N/V, pelvic pain, hematuria, dysuria, concern for STIs or pregnancy. Tried some monistat last night without benefit.      Past Medical History:  Diagnosis Date  . Headache     Patient Active Problem List   Diagnosis Date Noted  . Anemia affecting pregnancy in first trimester 11/12/2017  . Bacterial vaginitis 10/30/2017  . Candida vaginitis 10/30/2017  . Abdominal cramping affecting pregnancy 10/30/2017  . Morning sickness 10/16/2017    Past Surgical History:  Procedure Laterality Date  . INDUCED ABORTION    . WISDOM TOOTH EXTRACTION      OB History    Gravida  3   Para      Term      Preterm      AB  2   Living        SAB  1   IAB      Ectopic      Multiple      Live Births               Home Medications    Prior to Admission medications   Medication Sig Start Date End Date Taking? Authorizing Provider  fluconazole (DIFLUCAN) 150 MG tablet Take 1 tablet (150 mg total) by mouth once for 1 dose. 01/10/21 01/10/21 Yes Particia Nearing, PA-C  acetaminophen (TYLENOL) 325 MG tablet Take 650 mg by mouth every 6 (six) hours as needed for headache.    [provider]  desonide (DESOWEN) 0.05 % ointment Apply 1 application topically 2 (two) times daily. Apply to the hands 03/29/18   Arthor Captain, PA-C  ferrous sulfate 325 (65 FE) MG tablet Take 1 tablet (325 mg total) by mouth daily with breakfast for 14 days. 12/06/18 12/20/18  Shaune Pollack, MD  fluconazole (DIFLUCAN) 150 MG tablet Take 1 tablet (150 mg total) by mouth daily. Take 1 tablet by mouth once. may repeat in 3 days if symptoms  persist 10/24/20   Rolla Etienne, NP  ibuprofen (ADVIL,MOTRIN) 600 MG tablet Take 1 tablet (600 mg total) by mouth every 8 (eight) hours as needed for moderate pain. 12/06/18   Shaune Pollack, MD  ketoconazole (NIZORAL) 2 % cream Apply 1 application topically daily. Apply to the vaginal region. 03/29/18   Harris, Cammy Copa, PA-C  lidocaine (XYLOCAINE) 2 % solution Use as directed 10 mLs in the mouth or throat as needed for mouth pain. 09/20/20   Particia Nearing, PA-C  metroNIDAZOLE (FLAGYL) 500 MG tablet Take 1 tablet (500 mg total) by mouth 2 (two) times daily. 08/09/20   Merrilee Jansky, MD  naproxen (NAPROSYN) 500 MG tablet Take 1 tablet (500 mg total) by mouth 2 (two) times daily with a meal. 12/28/20   Wallis Bamberg, PA-C  ondansetron (ZOFRAN-ODT) 4 MG disintegrating tablet Take 1 tablet (4 mg total) by mouth every 8 (eight) hours as needed for nausea or vomiting. 11/19/17   Pincus Large, DO    Family History Family History  Problem Relation Age of Onset  . Diabetes Maternal Grandmother   . Seizures Maternal Grandmother   . Asthma Maternal  Grandmother   . Cancer Maternal Grandfather     Social History Social History   Tobacco Use  . Smoking status: Never Smoker  . Smokeless tobacco: Never Used  Substance Use Topics  . Alcohol use: No  . Drug use: No     Allergies   Patient has no known allergies.   Review of Systems Review of Systems PER HPI   Physical Exam Triage Vital Signs ED Triage Vitals  Enc Vitals Group     BP 01/10/21 1914 118/70     Pulse Rate 01/10/21 1914 64     Resp 01/10/21 1914 17     Temp 01/10/21 1914 98.4 F (36.9 C)     Temp Source 01/10/21 1914 Oral     SpO2 01/10/21 1914 98 %     Weight --      Height --      Head Circumference --      Peak Flow --      Pain Score 01/10/21 1913 0     Pain Loc --      Pain Edu? --      Excl. in GC? --    No data found.  Updated Vital Signs BP 118/70 (BP Location: Left Arm)   Pulse 64   Temp  98.4 F (36.9 C) (Oral)   Resp 17   LMP 12/26/2020   SpO2 98%   Visual Acuity Right Eye Distance:   Left Eye Distance:   Bilateral Distance:    Right Eye Near:   Left Eye Near:    Bilateral Near:     Physical Exam Vitals and nursing note reviewed.  Constitutional:      Appearance: Normal appearance. She is not ill-appearing.  HENT:     Head: Atraumatic.  Eyes:     Extraocular Movements: Extraocular movements intact.     Conjunctiva/sclera: Conjunctivae normal.  Cardiovascular:     Rate and Rhythm: Normal rate and regular rhythm.     Heart sounds: Normal heart sounds.  Pulmonary:     Effort: Pulmonary effort is normal.     Breath sounds: Normal breath sounds.  Abdominal:     General: Bowel sounds are normal. There is no distension.     Palpations: Abdomen is soft.     Tenderness: There is no abdominal tenderness. There is no guarding.  Genitourinary:    Comments: GU exam deferred, self swab performed Musculoskeletal:        General: Normal range of motion.     Cervical back: Normal range of motion and neck supple.  Skin:    General: Skin is warm and dry.  Neurological:     Mental Status: She is alert and oriented to person, place, and time.  Psychiatric:        Mood and Affect: Mood normal.        Thought Content: Thought content normal.        Judgment: Judgment normal.      UC Treatments / Results  Labs (all labs ordered are listed, but only abnormal results are displayed) Labs Reviewed  CERVICOVAGINAL ANCILLARY ONLY    EKG   Radiology No results found.  Procedures Procedures (including critical care time)  Medications Ordered in UC Medications - No data to display  Initial Impression / Assessment and Plan / UC Course  I have reviewed the triage vital signs and the nursing notes.  Pertinent labs & imaging results that were available during my care of the patient were reviewed  by me and considered in my medical decision making (see chart for  details).     She states this feels like past yeast infections, aptima swab pending, will send diflucan tab while waiting for results. Abstinence while results pending. OTC remedies reviewed.   Final Clinical Impressions(s) / UC Diagnoses   Final diagnoses:  Acute vaginitis   Discharge Instructions   None    ED Prescriptions    Medication Sig Dispense Auth. Provider   fluconazole (DIFLUCAN) 150 MG tablet Take 1 tablet (150 mg total) by mouth once for 1 dose. 1 tablet Particia Nearing, [place]     PDMP not reviewed this encounter.   Particia Nearing, [place] 01/10/21 2004

## 2021-01-10 NOTE — ED Triage Notes (Signed)
Pt presents with vaginal irritation, itching and discharge xs 2 days.

## 2021-01-11 LAB — CERVICOVAGINAL ANCILLARY ONLY
Bacterial Vaginitis (gardnerella): NEGATIVE
Candida Glabrata: NEGATIVE
Candida Vaginitis: NEGATIVE
Chlamydia: NEGATIVE
Comment: NEGATIVE
Comment: NEGATIVE
Comment: NEGATIVE
Comment: NEGATIVE
Comment: NEGATIVE
Comment: NORMAL
Neisseria Gonorrhea: NEGATIVE
Trichomonas: NEGATIVE

## 2021-03-03 ENCOUNTER — Encounter (HOSPITAL_BASED_OUTPATIENT_CLINIC_OR_DEPARTMENT_OTHER): Payer: Self-pay | Admitting: Emergency Medicine

## 2021-03-03 ENCOUNTER — Emergency Department (HOSPITAL_BASED_OUTPATIENT_CLINIC_OR_DEPARTMENT_OTHER)
Admission: EM | Admit: 2021-03-03 | Discharge: 2021-03-03 | Disposition: A | Discharge status: Home / Self Care | Payer: BC Managed Care – PPO | Attending: Emergency Medicine | Admitting: Emergency Medicine

## 2021-03-03 ENCOUNTER — Encounter (HOSPITAL_BASED_OUTPATIENT_CLINIC_OR_DEPARTMENT_OTHER): Payer: Self-pay | Admitting: *Deleted

## 2021-03-03 ENCOUNTER — Other Ambulatory Visit: Payer: Self-pay

## 2021-03-03 DIAGNOSIS — N939 Abnormal uterine and vaginal bleeding, unspecified: Secondary | ICD-10-CM | POA: Insufficient documentation

## 2021-03-03 DIAGNOSIS — Z5321 Procedure and treatment not carried out due to patient leaving prior to being seen by health care provider: Secondary | ICD-10-CM | POA: Insufficient documentation

## 2021-03-03 LAB — URINALYSIS, ROUTINE W REFLEX MICROSCOPIC
Bilirubin Urine: NEGATIVE
Glucose, UA: NEGATIVE mg/dL
Hgb urine dipstick: NEGATIVE
Ketones, ur: NEGATIVE mg/dL
Leukocytes,Ua: NEGATIVE
Nitrite: NEGATIVE
Protein, ur: NEGATIVE mg/dL
Specific Gravity, Urine: 1.02 (ref 1.005–1.030)
pH: 6.5 (ref 5.0–8.0)

## 2021-03-03 LAB — PREGNANCY, URINE: Preg Test, Ur: NEGATIVE

## 2021-03-03 NOTE — ED Triage Notes (Signed)
Pt is c/o vaginal spotting x 3 days  Last normal period was the 8th  Pt was here earlier but had to leave  Pt had given a urine specimen at that time

## 2021-03-03 NOTE — ED Triage Notes (Signed)
Spotting. Her menses ended 4 days ago. No pain.

## 2021-03-04 ENCOUNTER — Emergency Department (HOSPITAL_BASED_OUTPATIENT_CLINIC_OR_DEPARTMENT_OTHER)
Admission: EM | Admit: 2021-03-04 | Discharge: 2021-03-04 | Disposition: A | Discharge status: Home / Self Care | Payer: BC Managed Care – PPO | Source: Home / Self Care | Attending: Emergency Medicine | Admitting: Emergency Medicine

## 2021-03-04 ENCOUNTER — Inpatient Hospital Stay (HOSPITAL_BASED_OUTPATIENT_CLINIC_OR_DEPARTMENT_OTHER): Admit: 2021-03-04 | Payer: BC Managed Care – PPO

## 2021-03-04 DIAGNOSIS — N939 Abnormal uterine and vaginal bleeding, unspecified: Secondary | ICD-10-CM

## 2021-03-04 NOTE — ED Notes (Signed)
EDP at bedside  

## 2021-03-04 NOTE — ED Provider Notes (Signed)
[facility] EMERGENCY DEPARTMENT Provider Note   CSN: [id number] Arrival date & time: 03/03/21  2329     History Chief Complaint  Patient presents with  . Vaginal Bleeding    April Wallace is a 25 y.o. female.  Patient is a 25 year old female with past medical history of vaginitis and previous visits for vaginal spotting.  She presents today for evaluation of vaginal spotting.  This has been ongoing for the past several days.  She finished her menstrual period last week, then the spotting started several days later.  She denies any active or heavy bleeding.  She denies any discharge.  She denies any pain or fever.  Patient is currently sexually active with one partner.  She tells me she was seen by her primary doctor 2 weeks ago for a routine visit.  She had a pelvic exam done at that time and no abnormalities were found.  The history is provided by the patient.  Vaginal Bleeding Quality:  Spotting Severity:  Mild Onset quality:  Gradual Duration:  3 days Timing:  Constant Progression:  Unchanged Chronicity:  New      Past Medical History:  Diagnosis Date  . Headache     Patient Active Problem List   Diagnosis Date Noted  . Anemia affecting pregnancy in first trimester 11/12/2017  . Bacterial vaginitis 10/30/2017  . Candida vaginitis 10/30/2017  . Abdominal cramping affecting pregnancy 10/30/2017  . Morning sickness 10/16/2017    Past Surgical History:  Procedure Laterality Date  . INDUCED ABORTION    . WISDOM TOOTH EXTRACTION       OB History    Gravida  3   Para      Term      Preterm      AB  2   Living        SAB  1   IAB      Ectopic      Multiple      Live Births              Family History  Problem Relation Age of Onset  . Diabetes Maternal Grandmother   . Seizures Maternal Grandmother   . Asthma Maternal Grandmother   . Cancer Maternal Grandfather     Social History   Tobacco Use  . Smoking status:  Never Smoker  . Smokeless tobacco: Never Used  Substance Use Topics  . Alcohol use: No  . Drug use: No    Home Medications Prior to Admission medications   Medication Sig Start Date End Date Taking? Authorizing Provider  acetaminophen (TYLENOL) 325 MG tablet Take 650 mg by mouth every 6 (six) hours as needed for headache.    [provider]  desonide (DESOWEN) 0.05 % ointment Apply 1 application topically 2 (two) times daily. Apply to the hands 03/29/18   Arthor Captain, PA-C  ferrous sulfate 325 (65 FE) MG tablet Take 1 tablet (325 mg total) by mouth daily with breakfast for 14 days. 12/06/18 12/20/18  Shaune Pollack, MD  fluconazole (DIFLUCAN) 150 MG tablet Take 1 tablet (150 mg total) by mouth daily. Take 1 tablet by mouth once. may repeat in 3 days if symptoms persist 10/24/20   Rolla Etienne, NP  ibuprofen (ADVIL,MOTRIN) 600 MG tablet Take 1 tablet (600 mg total) by mouth every 8 (eight) hours as needed for moderate pain. 12/06/18   Shaune Pollack, MD  ketoconazole (NIZORAL) 2 % cream Apply 1 application topically daily. Apply to the  vaginal region. 03/29/18   Harris, Cammy Copa, PA-C  lidocaine (XYLOCAINE) 2 % solution Use as directed 10 mLs in the mouth or throat as needed for mouth pain. 09/20/20   Particia Nearing, PA-C  metroNIDAZOLE (FLAGYL) 500 MG tablet Take 1 tablet (500 mg total) by mouth 2 (two) times daily. 08/09/20   Merrilee Jansky, MD  naproxen (NAPROSYN) 500 MG tablet Take 1 tablet (500 mg total) by mouth 2 (two) times daily with a meal. 12/28/20   Wallis Bamberg, PA-C  ondansetron (ZOFRAN-ODT) 4 MG disintegrating tablet Take 1 tablet (4 mg total) by mouth every 8 (eight) hours as needed for nausea or vomiting. 11/19/17   Pincus Large, DO    Allergies    Patient has no known allergies.  Review of Systems   Review of Systems  Genitourinary: Positive for vaginal bleeding.  All other systems reviewed and are negative.   Physical Exam Updated Vital Signs BP  108/74 (BP Location: Right Arm)   Pulse 68   Temp 98.4 F (36.9 C) (Oral)   Resp 14   Ht 5\' 4"  (1.626 m)   Wt 57.2 kg   LMP 02/21/2021 (Exact Date)   SpO2 100%   BMI 21.65 kg/m   Physical Exam Vitals and nursing note reviewed.  Constitutional:      General: She is not in acute distress.    Appearance: She is well-developed. She is not diaphoretic.  HENT:     Head: Normocephalic and atraumatic.  Cardiovascular:     Rate and Rhythm: Normal rate and regular rhythm.     Heart sounds: No murmur heard. No friction rub. No gallop.   Pulmonary:     Effort: Pulmonary effort is normal. No respiratory distress.     Breath sounds: Normal breath sounds. No wheezing.  Abdominal:     General: Bowel sounds are normal. There is no distension.     Palpations: Abdomen is soft.     Tenderness: There is no abdominal tenderness.  Musculoskeletal:        General: Normal range of motion.     Cervical back: Normal range of motion and neck supple.  Skin:    General: Skin is warm and dry.  Neurological:     Mental Status: She is alert and oriented to person, place, and time.     ED Results / Procedures / Treatments   Labs (all labs ordered are listed, but only abnormal results are displayed) Labs Reviewed - No data to display  EKG None  Radiology No results found.  Procedures Procedures   Medications Ordered in ED Medications - No data to display  ED Course  I have reviewed the triage vital signs and the nursing notes.  Pertinent labs & imaging results that were available during my care of the patient were reviewed by me and considered in my medical decision making (see chart for details).    MDM Rules/Calculators/A&P  Patient presenting today with complaints of vaginal spotting as described in the HPI.  Patient is hemodynamically stable and has no fever.  She is not having any discomfort in her abdomen is benign.  Pregnancy test today is negative and urinalysis is  clear.  Patient reports having a pelvic exam 2 weeks ago which was unremarkable.  She has had no new sexual partners since this time.  I see no reason to repeat a pelvic exam.  Patient appears very comfortable and describes the spotting as mild.  She has been seen in  the past with similar complaints.  My plan is to arrange an ultrasound for the morning and have her follow-up with GYN in the near future.  Patient may benefit from hormonal regulation of her menstrual..  Final Clinical Impression(s) / ED Diagnoses Final diagnoses:  None    Rx / DC Orders ED Discharge Orders    None       Geoffery Lyons, MD 03/04/21 [phone]

## 2021-03-04 NOTE — Discharge Instructions (Signed)
Return tomorrow at the given time for an ultrasound to evaluate the cause of your bleeding.  You should follow-up next week with your GYN to discuss these issues.  Return to the emergency department if symptoms significantly worsen or change in the meantime.

## 2021-03-06 ENCOUNTER — Ambulatory Visit (HOSPITAL_BASED_OUTPATIENT_CLINIC_OR_DEPARTMENT_OTHER): Admission: RE | Admit: 2021-03-06 | Payer: BC Managed Care – PPO | Source: Ambulatory Visit

## 2021-03-16 ENCOUNTER — Other Ambulatory Visit: Payer: Self-pay

## 2021-03-16 ENCOUNTER — Emergency Department (HOSPITAL_BASED_OUTPATIENT_CLINIC_OR_DEPARTMENT_OTHER)
Admission: EM | Admit: 2021-03-16 | Discharge: 2021-03-16 | Disposition: A | Discharge status: Home / Self Care | Payer: BC Managed Care – PPO | Attending: Emergency Medicine | Admitting: Emergency Medicine

## 2021-03-16 ENCOUNTER — Encounter (HOSPITAL_BASED_OUTPATIENT_CLINIC_OR_DEPARTMENT_OTHER): Payer: Self-pay | Admitting: Emergency Medicine

## 2021-03-16 DIAGNOSIS — D72829 Elevated white blood cell count, unspecified: Secondary | ICD-10-CM | POA: Diagnosis not present

## 2021-03-16 DIAGNOSIS — R300 Dysuria: Secondary | ICD-10-CM | POA: Insufficient documentation

## 2021-03-16 LAB — URINALYSIS, ROUTINE W REFLEX MICROSCOPIC
Bilirubin Urine: NEGATIVE
Glucose, UA: NEGATIVE mg/dL
Hgb urine dipstick: NEGATIVE
Ketones, ur: NEGATIVE mg/dL
Nitrite: NEGATIVE
Protein, ur: NEGATIVE mg/dL
Specific Gravity, Urine: 1.02 (ref 1.005–1.030)
pH: 7 (ref 5.0–8.0)

## 2021-03-16 LAB — URINALYSIS, MICROSCOPIC (REFLEX)

## 2021-03-16 LAB — PREGNANCY, URINE: Preg Test, Ur: NEGATIVE

## 2021-03-16 MED ORDER — CEPHALEXIN 500 MG PO CAPS: 500.0000 mg | ORAL_CAPSULE | Freq: Two times a day (BID) | ORAL | 0 refills | Status: AC

## 2021-03-16 NOTE — ED Triage Notes (Signed)
UTI since yesterday     Hurts to void  Denies  Vag d/c

## 2021-03-16 NOTE — Discharge Instructions (Addendum)
At this time there does not appear to be the presence of an emergent medical condition, however there is always the potential for conditions to change. Please read and follow the below instructions.  Please return to the Emergency Department immediately for any new or worsening symptoms or if your symptoms do not improve within 2 days. Please be sure to follow up with your Primary Care Provider within one week regarding your visit today; please call their office to schedule an appointment even if you are feeling better for a follow-up visit. Please take your antibiotic Keflex as prescribed until complete to help with your symptoms.  Please drink enough water to avoid dehydration and get plenty of rest.  Go to the nearest Emergency Department immediately if: You have fever or chills Your pain is severe and not relieved with medicines. You cannot eat or drink without vomiting. You are confused. You have a rapid heartbeat while resting. You have shaking or chills. You feel extremely weak. You have any new/concerning or worsening of symptoms  Please read the additional information packets attached to your discharge summary.  Do not take your medicine if  develop an itchy rash, swelling in your mouth or lips, or difficulty breathing; call 911 and seek immediate emergency medical attention if this occurs.  You may review your lab tests and imaging results in their entirety on your MyChart account.  Please discuss all results of fully with your primary care provider and other specialist at your follow-up visit.  Note: Portions of this text may have been transcribed using voice recognition software. Every effort was made to ensure accuracy; however, inadvertent computerized transcription errors may still be present.

## 2021-03-16 NOTE — ED Provider Notes (Addendum)
[facility] EMERGENCY DEPARTMENT Provider Note   CSN: [id number] Arrival date & time: 03/16/21  1307     History Chief Complaint  Patient presents with  . Dysuria    April Wallace is a 25 y.o. female history of anemia otherwise healthy.  Patient presents today for concern of UTI, reports yesterday morning when she woke up she noticed a stinging sensation whenever she urinated this is been mild only occurs when she urinates, pain does not radiate, resolves completely when she stops urinating.  Patient reports that she has had urinary tract infections in the past and feels this is very similar today.  She has not taken any antibiotics prior to arrival.  Patient denies fever/chills, abdominal pain, nausea/vomiting, hematuria, vaginal bleeding/discharge, concern for STI or any additional concerns.  Additionally patient reports that she is only sexually active with her significant other and she had STD testing through her OB/GYN earlier this month and all tests were negative.  HPI     Past Medical History:  Diagnosis Date  . Headache     Patient Active Problem List   Diagnosis Date Noted  . Anemia affecting pregnancy in first trimester 11/12/2017  . Bacterial vaginitis 10/30/2017  . Candida vaginitis 10/30/2017  . Abdominal cramping affecting pregnancy 10/30/2017  . Morning sickness 10/16/2017    Past Surgical History:  Procedure Laterality Date  . INDUCED ABORTION    . WISDOM TOOTH EXTRACTION       OB History    Gravida  3   Para      Term      Preterm      AB  2   Living        SAB  1   IAB      Ectopic      Multiple      Live Births              Family History  Problem Relation Age of Onset  . Diabetes Maternal Grandmother   . Seizures Maternal Grandmother   . Asthma Maternal Grandmother   . Cancer Maternal Grandfather     Social History   Tobacco Use  . Smoking status: Never Smoker  . Smokeless tobacco: Never Used   Substance Use Topics  . Alcohol use: No  . Drug use: No    Home Medications Prior to Admission medications   Medication Sig Start Date End Date Taking? Authorizing Provider  cephALEXin (KEFLEX) 500 MG capsule Take 1 capsule (500 mg total) by mouth 2 (two) times daily for 5 days. 03/16/21 03/21/21 Yes Harlene Salts A, PA-C  acetaminophen (TYLENOL) 325 MG tablet Take 650 mg by mouth every 6 (six) hours as needed for headache.    [provider]  desonide (DESOWEN) 0.05 % ointment Apply 1 application topically 2 (two) times daily. Apply to the hands 03/29/18   Arthor Captain, PA-C  ferrous sulfate 325 (65 FE) MG tablet Take 1 tablet (325 mg total) by mouth daily with breakfast for 14 days. 12/06/18 12/20/18  Shaune Pollack, MD  fluconazole (DIFLUCAN) 150 MG tablet Take 1 tablet (150 mg total) by mouth daily. Take 1 tablet by mouth once. may repeat in 3 days if symptoms persist 10/24/20   Rolla Etienne, NP  ibuprofen (ADVIL,MOTRIN) 600 MG tablet Take 1 tablet (600 mg total) by mouth every 8 (eight) hours as needed for moderate pain. 12/06/18   Shaune Pollack, MD  ketoconazole (NIZORAL) 2 % cream Apply 1 application topically  daily. Apply to the vaginal region. 03/29/18   Harris, Cammy Copa, PA-C  lidocaine (XYLOCAINE) 2 % solution Use as directed 10 mLs in the mouth or throat as needed for mouth pain. 09/20/20   Particia Nearing, PA-C  metroNIDAZOLE (FLAGYL) 500 MG tablet Take 1 tablet (500 mg total) by mouth 2 (two) times daily. 08/09/20   Merrilee Jansky, MD  naproxen (NAPROSYN) 500 MG tablet Take 1 tablet (500 mg total) by mouth 2 (two) times daily with a meal. 12/28/20   Wallis Bamberg, PA-C  ondansetron (ZOFRAN-ODT) 4 MG disintegrating tablet Take 1 tablet (4 mg total) by mouth every 8 (eight) hours as needed for nausea or vomiting. 11/19/17   Pincus Large, DO    Allergies    Patient has no known allergies.  Review of Systems   Review of Systems Ten systems are reviewed and are  negative for acute change except as noted in the HPI  Physical Exam Updated Vital Signs BP (!) 98/59 (BP Location: Right Arm)   Pulse 72   Temp 98.7 F (37.1 C)   Resp 18   Ht 5\' 4"  (1.626 m)   Wt 57.2 kg   LMP 02/21/2021 (Exact Date)   SpO2 100%   BMI 21.63 kg/m   Physical Exam Constitutional:      General: She is not in acute distress.    Appearance: Normal appearance. She is well-developed. She is not ill-appearing or diaphoretic.  HENT:     Head: Normocephalic and atraumatic.  Eyes:     General: Vision grossly intact. Gaze aligned appropriately.     Pupils: Pupils are equal, round, and reactive to light.  Neck:     Trachea: Trachea and phonation normal.  Pulmonary:     Effort: Pulmonary effort is normal. No respiratory distress.  Abdominal:     General: There is no distension.     Palpations: Abdomen is soft.     Tenderness: There is no abdominal tenderness. There is no right CVA tenderness, left CVA tenderness, guarding or rebound.  Genitourinary:    Comments: Pelvic examination deferred by patient Musculoskeletal:        General: Normal range of motion.     Cervical back: Normal range of motion.  Skin:    General: Skin is warm and dry.  Neurological:     Mental Status: She is alert.     GCS: GCS eye subscore is 4. GCS verbal subscore is 5. GCS motor subscore is 6.     Comments: Speech is clear and goal oriented, follows commands Major Cranial nerves without deficit, no facial droop Moves extremities without ataxia, coordination intact  Psychiatric:        Behavior: Behavior normal.     ED Results / Procedures / Treatments   Labs (all labs ordered are listed, but only abnormal results are displayed) Labs Reviewed  URINALYSIS, ROUTINE W REFLEX MICROSCOPIC - Abnormal; Notable for the following components:      Result Value   APPearance CLOUDY (*)    Leukocytes,Ua MODERATE (*)    All other components within normal limits  URINALYSIS, MICROSCOPIC (REFLEX)  - Abnormal; Notable for the following components:   Bacteria, UA FEW (*)    All other components within normal limits  URINE CULTURE  PREGNANCY, URINE    EKG None  Radiology No results found.  Procedures Procedures   Medications Ordered in ED Medications - No data to display  ED Course  I have reviewed the triage vital  signs and the nursing notes.  Pertinent labs & imaging results that were available during my care of the patient were reviewed by me and considered in my medical decision making (see chart for details).    MDM Rules/Calculators/A&P                         Additional history obtained from: 1. Nursing notes from this visit. 2. Review of electronic medical records. ------ 25 year old female presented for dysuria x1 day, no systemic symptoms to suggest pyelonephritis additionally patient denies any vaginal complaints or concern for STI. Patient deferred pelvic examination today and states understanding of limitations of work-up, given reassuring vital signs and no abdominal tenderness to palpation low suspicion for PID.    Urinalysis shows leukocytes, 6-10 RBCs, 21-50 WBCs, few bacteria, no squamous cells, no nitrites. Urine pregnancy test negative.  Plan of care is treatment with Keflex 500 mg twice daily x5 days for suspected lower UTI, encourage patient to closely follow-up with her primary care provider.  There is no indication for blood work at this time no evidence for systemic infection, vital signs are stable. She is aware urine culture is pending and she will be contacted if a new prescription is indicated.  Encourage patient to call her primary care provider today to schedule follow-up appointment for next week.   At this time there does not appear to be any evidence of an acute emergency medical condition and the patient appears stable for discharge with appropriate outpatient follow up. Diagnosis was discussed with patient who verbalizes understanding of  care plan and is agreeable to discharge. I have discussed return precautions with patient who verbalizes understanding. Patient encouraged to follow-up with their PCP. All questions answered.   Note: Portions of this report may have been transcribed using voice recognition software. Every effort was made to ensure accuracy; however, inadvertent computerized transcription errors may still be present. Final Clinical Impression(s) / ED Diagnoses Final diagnoses:  Dysuria    Rx / DC Orders ED Discharge Orders         Ordered    cephALEXin (KEFLEX) 500 MG capsule  2 times daily        03/16/21 1529           Elizabeth Palau 03/16/21 1531    Bill Salinas, PA-C 03/16/21 1533    Milagros Loll, MD 03/16/21 [phone]

## 2021-03-17 ENCOUNTER — Other Ambulatory Visit: Payer: Self-pay

## 2021-03-17 ENCOUNTER — Telehealth (HOSPITAL_BASED_OUTPATIENT_CLINIC_OR_DEPARTMENT_OTHER): Payer: Self-pay

## 2021-03-17 ENCOUNTER — Ambulatory Visit (HOSPITAL_COMMUNITY)
Admission: EM | Admit: 2021-03-17 | Discharge: 2021-03-17 | Disposition: A | Discharge status: Home / Self Care | Payer: BC Managed Care – PPO | Attending: Emergency Medicine | Admitting: Emergency Medicine

## 2021-03-17 ENCOUNTER — Encounter (HOSPITAL_COMMUNITY): Payer: Self-pay

## 2021-03-17 DIAGNOSIS — N898 Other specified noninflammatory disorders of vagina: Secondary | ICD-10-CM

## 2021-03-17 DIAGNOSIS — Z113 Encounter for screening for infections with a predominantly sexual mode of transmission: Secondary | ICD-10-CM | POA: Insufficient documentation

## 2021-03-17 DIAGNOSIS — N390 Urinary tract infection, site not specified: Secondary | ICD-10-CM | POA: Diagnosis not present

## 2021-03-17 LAB — POCT URINALYSIS DIPSTICK, ED / UC
Bilirubin Urine: NEGATIVE
Glucose, UA: NEGATIVE mg/dL
Hgb urine dipstick: NEGATIVE
Ketones, ur: NEGATIVE mg/dL
Nitrite: NEGATIVE
Protein, ur: NEGATIVE mg/dL
Specific Gravity, Urine: 1.025 (ref 1.005–1.030)
Urobilinogen, UA: 0.2 mg/dL (ref 0.0–1.0)
pH: 7 (ref 5.0–8.0)

## 2021-03-17 LAB — POC URINE PREG, ED: Preg Test, Ur: NEGATIVE

## 2021-03-17 MED ORDER — FLUCONAZOLE 150 MG PO TABS: 150.0000 mg | ORAL_TABLET | Freq: Once | ORAL | 1 refills | Status: AC

## 2021-03-17 MED ORDER — IBUPROFEN 600 MG PO TABS: 600.0000 mg | ORAL_TABLET | Freq: Four times a day (QID) | ORAL | 0 refills | Status: DC | PRN

## 2021-03-17 MED ORDER — PHENAZOPYRIDINE HCL 200 MG PO TABS: 200.0000 mg | ORAL_TABLET | Freq: Three times a day (TID) | ORAL | 0 refills | Status: DC | PRN

## 2021-03-17 NOTE — Discharge Instructions (Addendum)
Suspect the pain is coming from your urinary tract infection.  The Pyridium will help with this.  Continue pushing plenty of extra fluids.  Take 600 mg of ibuprofen combined with 1000 mg of Tylenol together 3-4 times a day as needed for pain.  Start the Diflucan if your swab comes back positive for yeast or if your symptoms start to get worse.  We will contact you if we need to change your medication regimen in any way your urine culture will be back in a day or 2.  Continue Keflex.  Below is a list of primary care practices who are taking new patients for you to follow-up with.  [facility] internal medicine clinic Ground Floor - [facility], [address] [place], [place], [place] [address] [phone]  [facility] at [facility] [address] [place], [place] [address] [phone]  [facility] [address] [place], [place] [address] [phone]  [facility] [phone] [address] [place] [address]  [facility]: [address] [place] [address]  [phone]  [facility]: [address] [place] [address]   [phone]  [facility]: [address] [place] [address]  [phone]  [facility] : [address] [place] [address] [phone]  [facility]: [address] [place] [address] [phone]  [facility]: [address] [place] [address] [phone]  Dr. Oneal Grout [address] [facility] [place] [address]  [phone]  Dr. Jackie Plum, [facility]. [address] [place], [place] [address]  [phone]  Go to www.goodrx.com to look up your medications. This will give  you a list of where you can find your prescriptions at the most affordable prices. Or ask the pharmacist what the cash price is, or if they have any other discount programs available to help make your medication more affordable. This can be less expensive than what you would pay with insurance.

## 2021-03-17 NOTE — ED Provider Notes (Signed)
HPI  SUBJECTIVE:  April Wallace is a 25 y.o. female who presents with nonmigratory, nonradiating bilateral seconds long crampy flank pain and vaginal irritation starting yesterday.  States that she was having the abdominal pain before starting the Keflex for UTI.  She denies nausea, vomiting, fevers, abdominal distention.  Last bowel movement was 2 days ago, and was within normal limits.  This is a normal stooling pattern for her.  She states that the dysuria has resolved.  She denies urinary urgency, frequency, cloudy or odorous urine, hematuria.  No vaginal itching, odor, bleeding, discharge, rash.  No back pain.  She has never had symptoms like this before.  No antipyretic in the past 6 hours.  No new perfumed soaps or body washes.  No aggravating or alleviating factors.  Has not tried anything for this abdominal pain.  She is in a long-term monogamous relationship with a female who is asymptomatic, STDs are not a concern today.  States that she had negative STD testing with her OB/GYN on 3/15.  Patient was seen in the ED yesterday with dysuria, thought to have a UTI with moderate leuks, few bacteria and was started on Keflex.  Urine culture sent and is still pending.  Urine pregnancy negative.  She did not have any abdominal pain at that visit per note.  She has a past medical history of UTI, chlamydia, yeast infections.  No history of pyelonephritis, nephrolithiasis, gonorrhea, HIV, HSV, syphilis, trichomonas, BV, diabetes, abdominal surgeries, PID, ectopic pregnancy, PCOS, ovarian cyst, ovarian torsion, TOA.  Family history negative for nephrolithiasis.  LMP: 3/8.  States that she is about to start menses.  PMD: None.  Past Medical History:  Diagnosis Date  . Headache     Past Surgical History:  Procedure Laterality Date  . INDUCED ABORTION    . WISDOM TOOTH EXTRACTION      Family History  Problem Relation Age of Onset  . Diabetes Maternal Grandmother   . Seizures Maternal  Grandmother   . Asthma Maternal Grandmother   . Cancer Maternal Grandfather   . Healthy Mother   . Healthy Father     Social History   Tobacco Use  . Smoking status: Never Smoker  . Smokeless tobacco: Never Used  Substance Use Topics  . Alcohol use: No  . Drug use: No    No current facility-administered medications for this encounter.  Current Outpatient Medications:  .  fluconazole (DIFLUCAN) 150 MG tablet, Take 1 tablet (150 mg total) by mouth once for 1 dose. 1 tab po x 1. May repeat in 72 hours if no improvement, Disp: 2 tablet, Rfl: 1 .  ibuprofen (ADVIL) 600 MG tablet, Take 1 tablet (600 mg total) by mouth every 6 (six) hours as needed., Disp: 30 tablet, Rfl: 0 .  phenazopyridine (PYRIDIUM) 200 MG tablet, Take 1 tablet (200 mg total) by mouth 3 (three) times daily as needed for pain., Disp: 6 tablet, Rfl: 0 .  acetaminophen (TYLENOL) 325 MG tablet, Take 650 mg by mouth every 6 (six) hours as needed for headache., Disp: , Rfl:  .  cephALEXin (KEFLEX) 500 MG capsule, Take 1 capsule (500 mg total) by mouth 2 (two) times daily for 5 days., Disp: 10 capsule, Rfl: 0 .  desonide (DESOWEN) 0.05 % ointment, Apply 1 application topically 2 (two) times daily. Apply to the hands, Disp: 15 g, Rfl: 2 .  ferrous sulfate 325 (65 FE) MG tablet, Take 1 tablet (325 mg total) by mouth daily with breakfast  for 14 days., Disp: 14 tablet, Rfl: 0 .  ketoconazole (NIZORAL) 2 % cream, Apply 1 application topically daily. Apply to the vaginal region., Disp: 30 g, Rfl: 0  No Known Allergies   ROS  As noted in HPI.   Physical Exam  BP 106/66   Pulse 83   Temp 99.1 F (37.3 C)   Resp 17   LMP 02/21/2021 (Exact Date)   SpO2 100%   Breastfeeding No   Constitutional: Well developed, well nourished, no acute distress Eyes:  EOMI, conjunctiva normal bilaterally HENT: Normocephalic, atraumatic,mucus membranes moist Respiratory: Normal inspiratory effort Cardiovascular: Normal rate GI:  nondistended.  Normal appearance, soft, nontender, active bowel sounds, no rebound, guarding.  No suprapubic, lower quadrant, flank tenderness.  Negative Murphy. Back: No CVAT. skin: No rash, skin intact Musculoskeletal: no deformities Neurologic: Alert & oriented x 3, no focal neuro deficits Psychiatric: Speech and behavior appropriate   ED Course   Medications - No data to display  Orders Placed This Encounter  Procedures  . POCT Urinalysis Dipstick (ED/UC)    Standing Status:   Standing    Number of Occurrences:   1  . POC urine preg, ED (not at [facility])    Standing Status:   Standing    Number of Occurrences:   1    Results for orders placed or performed during the hospital encounter of 03/17/21 (from the past 24 hour(s))  POCT Urinalysis Dipstick (ED/UC)     Status: Abnormal   Collection Time: 03/17/21 10:09 AM  Result Value Ref Range   Glucose, UA NEGATIVE NEGATIVE mg/dL   Bilirubin Urine NEGATIVE NEGATIVE   Ketones, ur NEGATIVE NEGATIVE mg/dL   Specific Gravity, Urine 1.025 1.005 - 1.030   Hgb urine dipstick NEGATIVE NEGATIVE   pH 7.0 5.0 - 8.0   Protein, ur NEGATIVE NEGATIVE mg/dL   Urobilinogen, UA 0.2 0.0 - 1.0 mg/dL   Nitrite NEGATIVE NEGATIVE   Leukocytes,Ua TRACE (A) NEGATIVE  POC urine preg, ED (not at [facility])     Status: None   Collection Time: 03/17/21 10:12 AM  Result Value Ref Range   Preg Test, Ur NEGATIVE NEGATIVE   No results found.  ED Clinical Impression  1. Urinary tract infection without hematuria, site unspecified   2. Vaginal irritation      ED Assessment/Plan  ED notes, labs reviewed.  As noted in HPI.  Abdomen completely benign.  She has no CVAT, vitals are normal, no antipyretic in the past 6 hours.  UA is still suggestive of a urinary tract infection.  Pregnancy again negative.  Suspect the pain is either coming from the urinary tract infection, or could be because she is about to start menses.  Will send home with Pyridium, have her  continue pushing fluids, continue the Keflex.  She could also have an early yeast infection from the Keflex.  Will send home with a wait-and-see prescription for Diflucan.  Patient wishes to be tested for gonorrhea, chlamydia, trichomonas, in addition to BV and yeast-swab sent.  She has MyChart.  Will provide primary care list for ongoing care and order assistance in finding a PMD.  Discussed labs, MDM, treatment plan, and plan for follow-up with patient. Discussed sn/sx that should prompt return to the ED. patient agrees with plan.   Meds ordered this encounter  Medications  . fluconazole (DIFLUCAN) 150 MG tablet    Sig: Take 1 tablet (150 mg total) by mouth once for 1 dose. 1 tab po x 1.  May repeat in 72 hours if no improvement    Dispense:  2 tablet    Refill:  1  . phenazopyridine (PYRIDIUM) 200 MG tablet    Sig: Take 1 tablet (200 mg total) by mouth 3 (three) times daily as needed for pain.    Dispense:  6 tablet    Refill:  0  . ibuprofen (ADVIL) 600 MG tablet    Sig: Take 1 tablet (600 mg total) by mouth every 6 (six) hours as needed.    Dispense:  30 tablet    Refill:  0          Domenick Gong, MD 03/17/21 1040

## 2021-03-17 NOTE — ED Triage Notes (Signed)
Pt in with c/o lower abdominal pain and vaginal irritation that started yesterday  Denies any vaginal discharge or urinary sxs

## 2021-03-17 NOTE — Telephone Encounter (Signed)
-----   Message from Aaron Edelman, RN sent at 03/17/2021  4:03 PM EDT ----- Regarding: Urgent Care PCP Placement Needs to Establish with PCP - routine

## 2021-03-18 LAB — URINE CULTURE: Culture: 20000 — AB

## 2021-03-19 LAB — CERVICOVAGINAL ANCILLARY ONLY
Bacterial Vaginitis (gardnerella): NEGATIVE
Candida Glabrata: NEGATIVE
Candida Vaginitis: NEGATIVE
Chlamydia: NEGATIVE
Comment: NEGATIVE
Comment: NEGATIVE
Comment: NEGATIVE
Comment: NEGATIVE
Comment: NEGATIVE
Comment: NORMAL
Neisseria Gonorrhea: POSITIVE — AB
Trichomonas: NEGATIVE

## 2021-03-20 ENCOUNTER — Other Ambulatory Visit: Payer: Self-pay

## 2021-03-20 ENCOUNTER — Ambulatory Visit (HOSPITAL_COMMUNITY)
Admission: EM | Admit: 2021-03-20 | Discharge: 2021-03-20 | Disposition: A | Discharge status: Home / Self Care | Payer: BC Managed Care – PPO | Attending: Family Medicine | Admitting: Family Medicine

## 2021-03-20 DIAGNOSIS — A549 Gonococcal infection, unspecified: Secondary | ICD-10-CM

## 2021-03-20 DIAGNOSIS — Z23 Encounter for immunization: Secondary | ICD-10-CM

## 2021-03-20 MED ORDER — CEFTRIAXONE SODIUM 500 MG IJ SOLR
INTRAMUSCULAR | Status: AC
  Filled 2021-03-20: qty 500

## 2021-03-20 MED ORDER — LIDOCAINE HCL (PF) 1 % IJ SOLN
INTRAMUSCULAR | Status: AC
  Filled 2021-03-20: qty 2

## 2021-03-20 MED ORDER — CEFTRIAXONE SODIUM 500 MG IJ SOLR
500.0000 mg | Freq: Once | INTRAMUSCULAR | Status: AC
  Administered 2021-03-20: 500 mg via INTRAMUSCULAR

## 2021-03-20 NOTE — ED Triage Notes (Signed)
Pt here for treatment of gonorrhea. Denies any symptoms.

## 2021-03-31 ENCOUNTER — Other Ambulatory Visit: Payer: Self-pay

## 2021-03-31 ENCOUNTER — Encounter (HOSPITAL_BASED_OUTPATIENT_CLINIC_OR_DEPARTMENT_OTHER): Payer: Self-pay | Admitting: *Deleted

## 2021-03-31 ENCOUNTER — Emergency Department (HOSPITAL_BASED_OUTPATIENT_CLINIC_OR_DEPARTMENT_OTHER)
Admission: EM | Admit: 2021-03-31 | Discharge: 2021-03-31 | Disposition: A | Discharge status: Home / Self Care | Payer: BC Managed Care – PPO | Attending: Emergency Medicine | Admitting: Emergency Medicine

## 2021-03-31 DIAGNOSIS — B9689 Other specified bacterial agents as the cause of diseases classified elsewhere: Secondary | ICD-10-CM | POA: Diagnosis not present

## 2021-03-31 DIAGNOSIS — L292 Pruritus vulvae: Secondary | ICD-10-CM | POA: Diagnosis present

## 2021-03-31 DIAGNOSIS — N898 Other specified noninflammatory disorders of vagina: Secondary | ICD-10-CM

## 2021-03-31 DIAGNOSIS — N760 Acute vaginitis: Secondary | ICD-10-CM | POA: Insufficient documentation

## 2021-03-31 LAB — WET PREP, GENITAL
Sperm: NONE SEEN
Trich, Wet Prep: NONE SEEN
Yeast Wet Prep HPF POC: NONE SEEN

## 2021-03-31 MED ORDER — METRONIDAZOLE 500 MG PO TABS
500.0000 mg | ORAL_TABLET | Freq: Once | ORAL | Status: AC
  Administered 2021-03-31: 500 mg via ORAL
  Filled 2021-03-31: qty 1

## 2021-03-31 MED ORDER — DOXYCYCLINE HYCLATE 100 MG PO TABS
100.0000 mg | ORAL_TABLET | Freq: Once | ORAL | Status: AC
  Administered 2021-03-31: 100 mg via ORAL
  Filled 2021-03-31: qty 1

## 2021-03-31 MED ORDER — CEFTRIAXONE SODIUM 500 MG IJ SOLR
500.0000 mg | Freq: Once | INTRAMUSCULAR | Status: AC
  Administered 2021-03-31: 500 mg via INTRAMUSCULAR
  Filled 2021-03-31: qty 500

## 2021-03-31 MED ORDER — DOXYCYCLINE HYCLATE 100 MG PO CAPS: 100.0000 mg | ORAL_CAPSULE | Freq: Two times a day (BID) | ORAL | 0 refills | Status: DC

## 2021-03-31 MED ORDER — METRONIDAZOLE 500 MG PO TABS: 500.0000 mg | ORAL_TABLET | Freq: Two times a day (BID) | ORAL | 0 refills | Status: DC

## 2021-03-31 MED ORDER — LIDOCAINE HCL (PF) 1 % IJ SOLN
INTRAMUSCULAR | Status: AC
  Administered 2021-03-31: 5 mL
  Filled 2021-03-31: qty 5

## 2021-03-31 NOTE — ED Triage Notes (Signed)
Vaginal itching. She was treated for [facility] over a week ago at [facility].

## 2021-03-31 NOTE — Discharge Instructions (Addendum)
Abstain from sexual contact while on antibiotics.  Follow up with your family doc.

## 2021-03-31 NOTE — ED Notes (Signed)
Pt verbalizes understanding of discharge instructions. Opportunity for questioning and answers were provided. Armand removed by staff, pt discharged from ED ambulatory to Home via Self.   

## 2021-03-31 NOTE — ED Provider Notes (Signed)
[facility] EMERGENCY DEPARTMENT Provider Note   CSN: [id number] Arrival date & time: 03/31/21  2206     History Chief Complaint  Patient presents with  . Vaginal Itching    April Wallace is a 25 y.o. female.  25 yo F with a chief complaints of vaginal discomfort.  She has trouble describing it but just does not think it feels right.  Has been having some continued discharge down there.  Was seen about 10 days ago and treated for gonorrhea.  The history is provided by the patient.  Vaginal Itching This is a new problem. The current episode started yesterday. The problem occurs constantly. The problem has not changed since onset.Pertinent negatives include no chest pain, no headaches and no shortness of breath. Nothing aggravates the symptoms. Nothing relieves the symptoms. She has tried nothing for the symptoms. The treatment provided no relief.       Past Medical History:  Diagnosis Date  . Headache     Patient Active Problem List   Diagnosis Date Noted  . Anemia affecting pregnancy in first trimester 11/12/2017  . Bacterial vaginitis 10/30/2017  . Candida vaginitis 10/30/2017  . Abdominal cramping affecting pregnancy 10/30/2017  . Morning sickness 10/16/2017    Past Surgical History:  Procedure Laterality Date  . INDUCED ABORTION    . WISDOM TOOTH EXTRACTION       OB History    Gravida  3   Para      Term      Preterm      AB  2   Living        SAB  1   IAB      Ectopic      Multiple      Live Births              Family History  Problem Relation Age of Onset  . Diabetes Maternal Grandmother   . Seizures Maternal Grandmother   . Asthma Maternal Grandmother   . Cancer Maternal Grandfather   . Healthy Mother   . Healthy Father     Social History   Tobacco Use  . Smoking status: Never Smoker  . Smokeless tobacco: Never Used  Substance Use Topics  . Alcohol use: No  . Drug use: No    Home Medications Prior  to Admission medications   Medication Sig Start Date End Date Taking? Authorizing Provider  doxycycline (VIBRAMYCIN) 100 MG capsule Take 1 capsule (100 mg total) by mouth 2 (two) times daily. One po bid x 7 days 03/31/21  Yes Melene Plan, DO  metroNIDAZOLE (FLAGYL) 500 MG tablet Take 1 tablet (500 mg total) by mouth 2 (two) times daily. 03/31/21  Yes Melene Plan, DO  acetaminophen (TYLENOL) 325 MG tablet Take 650 mg by mouth every 6 (six) hours as needed for headache.    [provider]  desonide (DESOWEN) 0.05 % ointment Apply 1 application topically 2 (two) times daily. Apply to the hands 03/29/18   Arthor Captain, PA-C  ferrous sulfate 325 (65 FE) MG tablet Take 1 tablet (325 mg total) by mouth daily with breakfast for 14 days. 12/06/18 12/20/18  Shaune Pollack, MD  ibuprofen (ADVIL) 600 MG tablet Take 1 tablet (600 mg total) by mouth every 6 (six) hours as needed. 03/17/21   Domenick Gong, MD  ketoconazole (NIZORAL) 2 % cream Apply 1 application topically daily. Apply to the vaginal region. 03/29/18   Arthor Captain, PA-C  phenazopyridine (PYRIDIUM) 200 MG  tablet Take 1 tablet (200 mg total) by mouth 3 (three) times daily as needed for pain. 03/17/21   Domenick Gong, MD    Allergies    Patient has no known allergies.  Review of Systems   Review of Systems  Constitutional: Negative for chills and fever.  HENT: Negative for congestion and rhinorrhea.   Eyes: Negative for redness and visual disturbance.  Respiratory: Negative for shortness of breath and wheezing.   Cardiovascular: Negative for chest pain and palpitations.  Gastrointestinal: Negative for nausea and vomiting.  Genitourinary: Positive for vaginal discharge. Negative for difficulty urinating, dysuria and urgency.  Musculoskeletal: Negative for arthralgias and myalgias.  Skin: Negative for pallor and wound.  Neurological: Negative for dizziness and headaches.    Physical Exam Updated Vital Signs BP 115/77 (BP  Location: Right Arm)   Pulse 76   Temp 98.1 F (36.7 C) (Oral)   Resp 16   Ht 5\' 4"  (1.626 m)   Wt 57.2 kg   SpO2 100%   BMI 21.65 kg/m   Physical Exam Vitals and nursing note reviewed.  Constitutional:      General: She is not in acute distress.    Appearance: She is well-developed. She is not diaphoretic.  HENT:     Head: Normocephalic and atraumatic.  Eyes:     Pupils: Pupils are equal, round, and reactive to light.  Cardiovascular:     Rate and Rhythm: Normal rate and regular rhythm.     Heart sounds: No murmur heard. No friction rub. No gallop.   Pulmonary:     Effort: Pulmonary effort is normal.     Breath sounds: No wheezing or rales.  Abdominal:     General: There is no distension.     Palpations: Abdomen is soft.     Tenderness: There is no abdominal tenderness.  Genitourinary:    Cervix: Discharge and friability present.     Adnexa: Right adnexa normal and left adnexa normal.  Musculoskeletal:        General: No tenderness.     Cervical back: Normal range of motion and neck supple.  Skin:    General: Skin is warm and dry.  Neurological:     Mental Status: She is alert and oriented to person, place, and time.  Psychiatric:        Behavior: Behavior normal.     ED Results / Procedures / Treatments   Labs (all labs ordered are listed, but only abnormal results are displayed) Labs Reviewed  WET PREP, GENITAL - Abnormal; Notable for the following components:      Result Value   Clue Cells Wet Prep HPF POC PRESENT (*)    WBC, Wet Prep HPF POC MODERATE (*)    All other components within normal limits  RPR  HIV ANTIBODY (ROUTINE TESTING W REFLEX)  GC/CHLAMYDIA PROBE AMP ([place]) NOT AT [facility]    EKG None  Radiology No results found.  Procedures Procedures   Medications Ordered in ED Medications  cefTRIAXone (ROCEPHIN) injection 500 mg (has no administration in time range)  doxycycline (VIBRA-TABS) tablet 100 mg (has no administration in  time range)  metroNIDAZOLE (FLAGYL) tablet 500 mg (has no administration in time range)    ED Course  I have reviewed the triage vital signs and the nursing notes.  Pertinent labs & imaging results that were available during my care of the patient were reviewed by me and considered in my medical decision making (see chart for details).  MDM Rules/Calculators/A&P                          25 yo F with a cc of vaginal discharge.  Going on for a week or so.  Recently treated for gonorrhea.    Patient is got BV.  We will treat.  Discharge home.  10:51 PM:  I have discussed the diagnosis/risks/treatment options with the patient and believe the pt to be eligible for discharge home to follow-up with PCP. We also discussed returning to the ED immediately if new or worsening sx occur. We discussed the sx which are most concerning (e.g., sudden worsening pain, fever, inability to tolerate by mouth) that necessitate immediate return. Medications administered to the patient during their visit and any new prescriptions provided to the patient are listed below.  Medications given during this visit Medications  cefTRIAXone (ROCEPHIN) injection 500 mg (has no administration in time range)  doxycycline (VIBRA-TABS) tablet 100 mg (has no administration in time range)  metroNIDAZOLE (FLAGYL) tablet 500 mg (has no administration in time range)     The patient appears reasonably screen and/or stabilized for discharge and I doubt any other medical condition or other [facility] requiring further screening, evaluation, or treatment in the ED at this time prior to discharge.     Final Clinical Impression(s) / ED Diagnoses Final diagnoses:  Vaginal discharge  BV (bacterial vaginosis)    Rx / DC Orders ED Discharge Orders         Ordered    doxycycline (VIBRAMYCIN) 100 MG capsule  2 times daily        03/31/21 2250    metroNIDAZOLE (FLAGYL) 500 MG tablet  2 times daily        03/31/21 2250            Melene Plan, DO 03/31/21 2251

## 2021-04-01 LAB — HIV ANTIBODY (ROUTINE TESTING W REFLEX): HIV Screen 4th Generation wRfx: NONREACTIVE

## 2021-04-02 LAB — RPR: RPR Ser Ql: NONREACTIVE

## 2021-04-03 LAB — GC/CHLAMYDIA PROBE AMP (~~LOC~~) NOT AT ARMC
Chlamydia: NEGATIVE
Comment: NEGATIVE
Comment: NORMAL
Neisseria Gonorrhea: NEGATIVE

## 2021-05-19 ENCOUNTER — Other Ambulatory Visit: Payer: Self-pay

## 2021-05-19 ENCOUNTER — Encounter (HOSPITAL_BASED_OUTPATIENT_CLINIC_OR_DEPARTMENT_OTHER): Payer: Self-pay

## 2021-05-19 DIAGNOSIS — N898 Other specified noninflammatory disorders of vagina: Secondary | ICD-10-CM | POA: Insufficient documentation

## 2021-05-19 NOTE — ED Triage Notes (Signed)
Pt c/o vaginal d/c x 2-3 days-NAD-steady gait 

## 2021-05-20 ENCOUNTER — Emergency Department (HOSPITAL_BASED_OUTPATIENT_CLINIC_OR_DEPARTMENT_OTHER)
Admission: EM | Admit: 2021-05-20 | Discharge: 2021-05-20 | Disposition: A | Discharge status: Home / Self Care | Payer: BC Managed Care – PPO | Attending: Emergency Medicine | Admitting: Emergency Medicine

## 2021-05-20 DIAGNOSIS — N898 Other specified noninflammatory disorders of vagina: Secondary | ICD-10-CM

## 2021-05-20 LAB — URINALYSIS, ROUTINE W REFLEX MICROSCOPIC
Bilirubin Urine: NEGATIVE
Glucose, UA: NEGATIVE mg/dL
Hgb urine dipstick: NEGATIVE
Ketones, ur: NEGATIVE mg/dL
Leukocytes,Ua: NEGATIVE
Nitrite: NEGATIVE
Protein, ur: NEGATIVE mg/dL
Specific Gravity, Urine: 1.02 (ref 1.005–1.030)
pH: 7 (ref 5.0–8.0)

## 2021-05-20 LAB — WET PREP, GENITAL
Clue Cells Wet Prep HPF POC: NONE SEEN
Sperm: NONE SEEN
Trich, Wet Prep: NONE SEEN
Yeast Wet Prep HPF POC: NONE SEEN

## 2021-05-20 LAB — PREGNANCY, URINE: Preg Test, Ur: NEGATIVE

## 2021-05-20 NOTE — ED Provider Notes (Signed)
[facility] EMERGENCY DEPARTMENT Provider Note   CSN: [id number] Arrival date & time: 05/19/21  2157     History Chief Complaint  Patient presents with  . Vaginal Discharge    April Wallace is a 25 y.o. female.  Patient is a 25 year old female presenting with complaints of vaginal discharge.  She noticed this yesterday.  She states that it has somewhat of a foul odor.  She denies any fevers or chills.  Patient denies having been sexually active for several months.  She denies abdominal pain.  She has not tried anything for her symptoms.  The history is provided by the patient.       Past Medical History:  Diagnosis Date  . Headache     Patient Active Problem List   Diagnosis Date Noted  . Anemia affecting pregnancy in first trimester 11/12/2017  . Bacterial vaginitis 10/30/2017  . Candida vaginitis 10/30/2017  . Abdominal cramping affecting pregnancy 10/30/2017  . Morning sickness 10/16/2017    Past Surgical History:  Procedure Laterality Date  . INDUCED ABORTION    . WISDOM TOOTH EXTRACTION       OB History    Gravida  3   Para      Term      Preterm      AB  2   Living        SAB  1   IAB      Ectopic      Multiple      Live Births              Family History  Problem Relation Age of Onset  . Diabetes Maternal Grandmother   . Seizures Maternal Grandmother   . Asthma Maternal Grandmother   . Cancer Maternal Grandfather   . Healthy Mother   . Healthy Father     Social History   Tobacco Use  . Smoking status: Never Smoker  . Smokeless tobacco: Never Used  Substance Use Topics  . Alcohol use: No  . Drug use: No    Home Medications Prior to Admission medications   Medication Sig Start Date End Date Taking? Authorizing Provider  acetaminophen (TYLENOL) 325 MG tablet Take 650 mg by mouth every 6 (six) hours as needed for headache.    [provider]  desonide (DESOWEN) 0.05 % ointment Apply 1  application topically 2 (two) times daily. Apply to the hands 03/29/18   Arthor Captain, PA-C  doxycycline (VIBRAMYCIN) 100 MG capsule Take 1 capsule (100 mg total) by mouth 2 (two) times daily. One po bid x 7 days 03/31/21   Melene Plan, DO  ferrous sulfate 325 (65 FE) MG tablet Take 1 tablet (325 mg total) by mouth daily with breakfast for 14 days. 12/06/18 12/20/18  Shaune Pollack, MD  ibuprofen (ADVIL) 600 MG tablet Take 1 tablet (600 mg total) by mouth every 6 (six) hours as needed. 03/17/21   Domenick Gong, MD  ketoconazole (NIZORAL) 2 % cream Apply 1 application topically daily. Apply to the vaginal region. 03/29/18   Harris, Cammy Copa, PA-C  metroNIDAZOLE (FLAGYL) 500 MG tablet Take 1 tablet (500 mg total) by mouth 2 (two) times daily. 03/31/21   Melene Plan, DO  phenazopyridine (PYRIDIUM) 200 MG tablet Take 1 tablet (200 mg total) by mouth 3 (three) times daily as needed for pain. 03/17/21   Domenick Gong, MD    Allergies    Patient has no known allergies.  Review of Systems   Review  of Systems  All other systems reviewed and are negative.   Physical Exam Updated Vital Signs BP 115/67   Pulse 81   Temp 98.4 F (36.9 C) (Oral)   Resp 18   Ht 5\' 4"  (1.626 m)   Wt 56.7 kg   LMP 04/28/2021   SpO2 100%   BMI 21.46 kg/m   Physical Exam Vitals and nursing note reviewed.  Constitutional:      General: She is not in acute distress.    Appearance: She is well-developed. She is not diaphoretic.  HENT:     Head: Normocephalic and atraumatic.  Cardiovascular:     Rate and Rhythm: Normal rate and regular rhythm.     Heart sounds: No murmur heard. No friction rub. No gallop.   Pulmonary:     Effort: Pulmonary effort is normal. No respiratory distress.     Breath sounds: Normal breath sounds. No wheezing.  Abdominal:     General: Bowel sounds are normal. There is no distension.     Palpations: Abdomen is soft.     Tenderness: There is no abdominal tenderness.  Genitourinary:     Comments: Thick exam reveals normal-appearing external genitalia.  Cervix appears normal and vaginal mucosa appears normal.  There is a slight whitish discharge present. Musculoskeletal:        General: Normal range of motion.     Cervical back: Normal range of motion and neck supple.  Skin:    General: Skin is warm and dry.  Neurological:     Mental Status: She is alert and oriented to person, place, and time.     ED Results / Procedures / Treatments   Labs (all labs ordered are listed, but only abnormal results are displayed) Labs Reviewed  PREGNANCY, URINE  URINALYSIS, ROUTINE W REFLEX MICROSCOPIC    EKG None  Radiology No results found.  Procedures Procedures   Medications Ordered in ED Medications - No data to display  ED Course  I have reviewed the triage vital signs and the nursing notes.  Pertinent labs & imaging results that were available during my care of the patient were reviewed by me and considered in my medical decision making (see chart for details).    MDM Rules/Calculators/A&P  Patient presenting here with complaints of vaginal discharge, the etiology of which I am uncertain.  Patient reports not being sexually active for several months, but this discharge began yesterday.  Her pelvic examination is very normal with minimal white discharge present.  Wet prep is negative showing only many white cells.  GC and Chlamydia cultures are pending.  Patient seems low risk for STD so I will withhold treatment pending cultures.  At this point, I feel as though patient stable for discharge with as needed return.  Final Clinical Impression(s) / ED Diagnoses Final diagnoses:  None    Rx / DC Orders ED Discharge Orders    None       04/30/2021, MD 05/20/21 [phone]

## 2021-05-20 NOTE — Discharge Instructions (Addendum)
We will call you if your cultures indicate you require further treatment or need to take additional action.  Return to the ER if symptoms significantly worsen or change.

## 2021-05-22 LAB — GC/CHLAMYDIA PROBE AMP (~~LOC~~) NOT AT ARMC
Chlamydia: NEGATIVE
Comment: NEGATIVE
Comment: NORMAL
Neisseria Gonorrhea: NEGATIVE

## 2022-02-19 ENCOUNTER — Encounter (HOSPITAL_COMMUNITY): Payer: Self-pay

## 2022-02-19 ENCOUNTER — Emergency Department (HOSPITAL_COMMUNITY): Payer: Self-pay

## 2022-02-19 ENCOUNTER — Other Ambulatory Visit: Payer: Self-pay

## 2022-02-19 ENCOUNTER — Emergency Department (HOSPITAL_COMMUNITY)
Admission: EM | Admit: 2022-02-19 | Discharge: 2022-02-19 | Disposition: A | Discharge status: Home / Self Care | Payer: Self-pay | Attending: General Surgery | Admitting: General Surgery

## 2022-02-19 DIAGNOSIS — S61332A Puncture wound without foreign body of right middle finger with damage to nail, initial encounter: Secondary | ICD-10-CM | POA: Insufficient documentation

## 2022-02-19 DIAGNOSIS — W450XXA Nail entering through skin, initial encounter: Secondary | ICD-10-CM | POA: Insufficient documentation

## 2022-02-19 DIAGNOSIS — S61309A Unspecified open wound of unspecified finger with damage to nail, initial encounter: Secondary | ICD-10-CM

## 2022-02-19 MED ORDER — LIDOCAINE HCL (PF) 1 % IJ SOLN
5.0000 mL | Freq: Once | INTRAMUSCULAR | Status: AC
  Administered 2022-02-19: 5 mL via INTRADERMAL
  Filled 2022-02-19: qty 5

## 2022-02-19 NOTE — ED Notes (Signed)
Splint applied

## 2022-02-19 NOTE — Progress Notes (Signed)
Orthopedic Tech Progress Note ?Patient Details:  ?April Wallace ?07-Oct-1996 ?[id number] ? ?Ortho Devices ?Type of Ortho Device: Finger splint ?Ortho Device/Splint Location: RUE(THUMB) ?Ortho Device/Splint Interventions: Ordered, Application, Adjustment ?  ?Post Interventions ?Patient Tolerated: Fair, Well ?Instructions Provided: Care of device ? ?[name] ?02/19/2022, 5:30 PM ? ?

## 2022-02-19 NOTE — ED Provider Notes (Signed)
? ?[facility] EMERGENCY DEPARTMENT  ?Provider Note ? ?CSN: [id number] ?Arrival date & time: 02/19/22 1322 ? ?History ?Chief Complaint  ?Patient presents with  ? Nail Problem  ? ? ?April Wallace is a 26 y.o. female who presented with a problem with the nail on her right middle finger.  She was walking her dog when she caught her nail on the railing of a stairwell.  Sudden onset of pain.  Pain has been constant since onset.  No palliating or provoking factors noted.  She states the nail was partially removed. ? ? ?Home Medications ?Prior to Admission medications   ?Medication Sig Start Date End Date Taking? Authorizing Provider  ?acetaminophen (TYLENOL) 325 MG tablet Take 650 mg by mouth every 6 (six) hours as needed for headache.    [provider]  ?desonide (DESOWEN) 0.05 % ointment Apply 1 application topically 2 (two) times daily. Apply to the hands 03/29/18   Arthor Captain, PA-C  ?doxycycline (VIBRAMYCIN) 100 MG capsule Take 1 capsule (100 mg total) by mouth 2 (two) times daily. One po bid x 7 days 03/31/21   Melene Plan, DO  ?ferrous sulfate 325 (65 FE) MG tablet Take 1 tablet (325 mg total) by mouth daily with breakfast for 14 days. 12/06/18 12/20/18  Shaune Pollack, MD  ?ibuprofen (ADVIL) 600 MG tablet Take 1 tablet (600 mg total) by mouth every 6 (six) hours as needed. 03/17/21   Domenick Gong, MD  ?ketoconazole (NIZORAL) 2 % cream Apply 1 application topically daily. Apply to the vaginal region. 03/29/18   Arthor Captain, PA-C  ?metroNIDAZOLE (FLAGYL) 500 MG tablet Take 1 tablet (500 mg total) by mouth 2 (two) times daily. 03/31/21   Melene Plan, DO  ?phenazopyridine (PYRIDIUM) 200 MG tablet Take 1 tablet (200 mg total) by mouth 3 (three) times daily as needed for pain. 03/17/21   Domenick Gong, MD  ? ? ? ?Allergies    ?Patient has no known allergies. ? ? ?Review of Systems   ?Review of Systems  ?Constitutional:  Negative for chills and fever.  ?HENT:  Negative for ear pain  and sore throat.   ?Eyes:  Negative for pain and visual disturbance.  ?Respiratory:  Negative for cough and shortness of breath.   ?Cardiovascular:  Negative for chest pain and palpitations.  ?Gastrointestinal:  Negative for abdominal pain and vomiting.  ?Genitourinary:  Negative for dysuria and hematuria.  ?Musculoskeletal:  Negative for arthralgias and back pain.  ?     Right middle finger pain  ?Skin:  Negative for color change and rash.  ?Neurological:  Negative for seizures and syncope.  ?All other systems reviewed and are negative. ?Please see HPI for pertinent positives and negatives ? ?Physical Exam ?BP 102/68   Pulse 68   Temp 98.5 ?F (36.9 ?C)   Resp 16   Ht 5\' 4"  (1.626 m)   Wt 56 kg   LMP 02/06/2022 (Approximate)   SpO2 98%   BMI 21.19 kg/m?  ? ?Physical Exam ?Vitals and nursing note reviewed.  ?Constitutional:   ?   General: She is not in acute distress. ?   Appearance: She is well-developed.  ?HENT:  ?   Head: Normocephalic and atraumatic.  ?Eyes:  ?   Conjunctiva/sclera: Conjunctivae normal.  ?Cardiovascular:  ?   Rate and Rhythm: Normal rate and regular rhythm.  ?   Heart sounds: No murmur heard. ?Pulmonary:  ?   Effort: Pulmonary effort is normal. No respiratory distress.  ?  Breath sounds: Normal breath sounds.  ?Abdominal:  ?   Palpations: Abdomen is soft.  ?   Tenderness: There is no abdominal tenderness.  ?Musculoskeletal:     ?   General: No swelling.  ?   Cervical back: Neck supple.  ?   Comments: With acrylic nails.  Her right middle nail appears to be partially avulsed.  There is no nailbed lacerations.  The nail is still attached at the proximal end of the nailbed under the cuticle.  ?Skin: ?   General: Skin is warm and dry.  ?   Capillary Refill: Capillary refill takes less than 2 seconds.  ?Neurological:  ?   Mental Status: She is alert.  ?Psychiatric:     ?   Mood and Affect: Mood normal.  ? ? ?ED Results / Procedures / Treatments    ?EKG ?None ? ?Procedures ?Procedures ? ?Medications Ordered in the ED ?Medications  ?lidocaine (PF) (XYLOCAINE) 1 % injection 5 mL (5 mLs Intradermal Given 02/19/22 1618)  ? ? ? ?ED Course  ? ?  ? ? ?MDM  ? ?This patient presents to the ED for concern of nail avulsion, this involves an extensive number of treatment options, and is a complaint that carries with it a high risk of complications and morbidity.  The differential diagnosis includes partial nail avulsion, nailbed laceration.  ? ?Additional history obtained: ?Additional history obtained from family ? ?Imaging Studies ordered: ?I ordered imaging studies including X-ray hand   ?I independently visualized and interpreted imaging which showed no acute fractures. ?I agree with the radiologist interpretation ? ?Medical Decision Making: Patient was in with a partial nail avulsion to her right middle finger.  There is no nailbed lacerations.  There is no underlying fractures or dislocations.  Patient does not require any repairs at this time.  Bandage was placed to keep the nail in place.  AlumaFoam splint placed over the top of the finger.  Discussed outpatient follow-up with her PCP.  She voiced understanding agreement this plan. ? ?Complexity of problems addressed: ?Patient?s presentation is most consistent with  acute complicated illness/injury requiring diagnostic workup ? ?Disposition: ?After consideration of the diagnostic results and the patient?s response to treatment,  ?I feel that the patent would benefit from discharge home .  ? ?Patient seen in conjunction with my attending, Dr. Lynelle Doctor. ? ? ? ?Final Clinical Impression(s) / ED Diagnoses ?Final diagnoses:  ?Avulsion of fingernail, initial encounter  ? ? ?Rx / DC Orders ?ED Discharge Orders   ? ? None  ? ?  ? ? ?  ?Edison Simon, MD ?02/19/22 1950 ? ?  ?Linwood Dibbles, MD ?02/20/22 1604 ? ?

## 2022-02-19 NOTE — ED Triage Notes (Signed)
Pt arrives POV for eval of R sided thumb/nail injury. Pt reports she was walking her dog and he pulled the leash, she jammed her nail/finger against something and reports the entire nail bed is "lifted up". Pt does have acrylic nails in place over nail bed as well ?

## 2022-03-31 ENCOUNTER — Other Ambulatory Visit: Payer: Self-pay

## 2022-03-31 ENCOUNTER — Emergency Department (HOSPITAL_BASED_OUTPATIENT_CLINIC_OR_DEPARTMENT_OTHER)
Admission: EM | Admit: 2022-03-31 | Discharge: 2022-03-31 | Disposition: A | Discharge status: Home / Self Care | Payer: No Typology Code available for payment source | Attending: Emergency Medicine | Admitting: Emergency Medicine

## 2022-03-31 ENCOUNTER — Encounter (HOSPITAL_BASED_OUTPATIENT_CLINIC_OR_DEPARTMENT_OTHER): Payer: Self-pay | Admitting: *Deleted

## 2022-03-31 DIAGNOSIS — S61101D Unspecified open wound of right thumb with damage to nail, subsequent encounter: Secondary | ICD-10-CM | POA: Insufficient documentation

## 2022-03-31 DIAGNOSIS — S6991XD Unspecified injury of right wrist, hand and finger(s), subsequent encounter: Secondary | ICD-10-CM | POA: Diagnosis present

## 2022-03-31 DIAGNOSIS — W231XXD Caught, crushed, jammed, or pinched between stationary objects, subsequent encounter: Secondary | ICD-10-CM | POA: Diagnosis not present

## 2022-03-31 DIAGNOSIS — Y93K1 Activity, walking an animal: Secondary | ICD-10-CM | POA: Diagnosis not present

## 2022-03-31 DIAGNOSIS — S61309D Unspecified open wound of unspecified finger with damage to nail, subsequent encounter: Secondary | ICD-10-CM

## 2022-03-31 NOTE — ED Triage Notes (Signed)
Patient reports she had right thumb injury approx a month ago---nail was pulled up due to acrylic nail being on. At this time patient does have nail bed growth, no numbness tingling or pain to base of nail.  ? ?Bandaid removed and on exam from injury that part of nail as not reattached (acrylic nail is still on). New growth is attached however.  ?

## 2022-03-31 NOTE — ED Provider Notes (Signed)
?[facility] EMERGENCY DEPT ?Provider Note ? ? ?CSN: [id number] ?Arrival date & time: 03/31/22  1124 ? ?  ? ?History ? ?Chief Complaint  ?Patient presents with  ? Nail Problem  ? ? ?April Wallace is a 26 y.o. female. ? ?HPI ?Patient presents for a finger injury.  She has no known chronic medical conditions.  Initial injury occurred over 1 month ago.  She describes it as her fingernail getting caught on a stairwell while walking her dog.  This caused a partial avulsion of the nail.  She was seen in the emergency department on 3/6.  X-ray imaging was negative at that time.  Nail was bandaged and placed in AlumaFoam splint was applied.  She was advised to follow-up with her PCP.  Patient has not obtained follow-up since that time.  She presents today due to nail not fully healing and attaching the nailbed as she anticipated.  She denies any new injuries.  She has not had any pain. ?  ? ?Home Medications ?Prior to Admission medications   ?Medication Sig Start Date End Date Taking? Authorizing Provider  ?acetaminophen (TYLENOL) 325 MG tablet Take 650 mg by mouth every 6 (six) hours as needed for headache.    [provider]  ?desonide (DESOWEN) 0.05 % ointment Apply 1 application topically 2 (two) times daily. Apply to the hands 03/29/18   Margarita Mail, PA-C  ?doxycycline (VIBRAMYCIN) 100 MG capsule Take 1 capsule (100 mg total) by mouth 2 (two) times daily. One po bid x 7 days 03/31/21   Deno Etienne, DO  ?ferrous sulfate 325 (65 FE) MG tablet Take 1 tablet (325 mg total) by mouth daily with breakfast for 14 days. 12/06/18 12/20/18  Duffy Bruce, MD  ?ibuprofen (ADVIL) 600 MG tablet Take 1 tablet (600 mg total) by mouth every 6 (six) hours as needed. 03/17/21   Melynda Ripple, MD  ?ketoconazole (NIZORAL) 2 % cream Apply 1 application topically daily. Apply to the vaginal region. 03/29/18   Margarita Mail, PA-C  ?metroNIDAZOLE (FLAGYL) 500 MG tablet Take 1 tablet (500 mg total) by mouth 2  (two) times daily. 03/31/21   Deno Etienne, DO  ?phenazopyridine (PYRIDIUM) 200 MG tablet Take 1 tablet (200 mg total) by mouth 3 (three) times daily as needed for pain. 03/17/21   Melynda Ripple, MD  ?   ? ?Allergies    ?Patient has no known allergies.   ? ?Review of Systems   ?Review of Systems  ?Musculoskeletal:  Negative for arthralgias.  ?Skin:  Negative for wound.  ?All other systems reviewed and are negative. ? ?Physical Exam ?Updated Vital Signs ?BP 110/67 (BP Location: Right Arm)   Pulse 79   Temp 98.2 ?F (36.8 ?C) (Oral)   Resp 18   SpO2 97%  ?Physical Exam ?Vitals and nursing note reviewed.  ?Constitutional:   ?   General: She is not in acute distress. ?   Appearance: Normal appearance. She is well-developed and normal weight. She is not ill-appearing, toxic-appearing or diaphoretic.  ?HENT:  ?   Head: Normocephalic and atraumatic.  ?   Right Ear: External ear normal.  ?   Left Ear: External ear normal.  ?   Nose: Nose normal.  ?Eyes:  ?   Extraocular Movements: Extraocular movements intact.  ?   Conjunctiva/sclera: Conjunctivae normal.  ?Cardiovascular:  ?   Rate and Rhythm: Normal rate and regular rhythm.  ?Pulmonary:  ?   Effort: Pulmonary effort is normal. No respiratory distress.  ?Abdominal:  ?  General: There is no distension.  ?   Palpations: Abdomen is soft.  ?Musculoskeletal:     ?   General: No swelling, tenderness or deformity.  ?   Cervical back: Normal range of motion. No rigidity.  ?   Comments: Previous injury occurred to nail of right thumb.  This nail continues to have artificial nail in place.  Native nail does still appear to be detached from nail bed distally.  ?Skin: ?   General: Skin is warm and dry.  ?   Capillary Refill: Capillary refill takes less than 2 seconds.  ?Neurological:  ?   General: No focal deficit present.  ?   Mental Status: She is alert and oriented to person, place, and time.  ?Psychiatric:     ?   Mood and Affect: Mood normal.     ?   Behavior: Behavior normal.      ?   Thought Content: Thought content normal.     ?   Judgment: Judgment normal.  ? ? ?ED Results / Procedures / Treatments   ?Labs ?(all labs ordered are listed, but only abnormal results are displayed) ?Labs Reviewed - No data to display ? ?EKG ?None ? ?Radiology ?No results found. ? ?Procedures ?Procedures  ? ? ?Medications Ordered in ED ?Medications - No data to display ? ?ED Course/ Medical Decision Making/ A&P ?  ?                        ?Medical Decision Making ? ?Healthy 26 year old female presenting for reassessment of a previous right thumbnail injury.  This initial injury occurred approximately 5 weeks ago.  She has not had any new injuries.  Vital signs are normal on arrival.  She is well-appearing on exam.  Given no reinjuries, no indication for imaging.  On exam, the distal aspect of her affected nail remains unattached from nail bed.  Proximal aspect of nail does appear to be attached.  Patient was advised to continue to give it time for healthy nail to grow out.  She is concerned of the artificial nail that is easily caught on things.  This artificial nail was trimmed down.  Coban was applied to hold the nail in place and avoid nail pulling off of the nailbed.  She was given contact information for hand doctor to follow-up as needed.  She was discharged in good condition. ? ? ? ? ? ? ? ?Final Clinical Impression(s) / ED Diagnoses ?Final diagnoses:  ?Partial avulsion of fingernail, subsequent encounter  ? ? ?Rx / DC Orders ?ED Discharge Orders   ? ? None  ? ?  ? ? ?  ?Godfrey Pick, MD ?03/31/22 1345 ? ?

## 2022-04-07 ENCOUNTER — Encounter (HOSPITAL_BASED_OUTPATIENT_CLINIC_OR_DEPARTMENT_OTHER): Payer: Self-pay | Admitting: Emergency Medicine

## 2022-04-07 ENCOUNTER — Emergency Department (HOSPITAL_BASED_OUTPATIENT_CLINIC_OR_DEPARTMENT_OTHER)
Admission: EM | Admit: 2022-04-07 | Discharge: 2022-04-08 | Disposition: A | Discharge status: Home / Self Care | Payer: No Typology Code available for payment source | Attending: Emergency Medicine | Admitting: Emergency Medicine

## 2022-04-07 ENCOUNTER — Other Ambulatory Visit: Payer: Self-pay

## 2022-04-07 DIAGNOSIS — N760 Acute vaginitis: Secondary | ICD-10-CM | POA: Diagnosis not present

## 2022-04-07 NOTE — ED Provider Notes (Signed)
?[facility] EMERGENCY DEPT ?Provider Note ? ? ?CSN: [id number] ?Arrival date & time: 04/07/22  1937 ? ?  ? ?History ? ?Chief Complaint  ?Patient presents with  ? Vaginitis  ? ? ?April Wallace is a 26 y.o. female. ? ?The history is provided by the patient.  ?April Wallace is a 26 y.o. female who presents to the Emergency Department complaining of vaginal discomfort.  She reports two days of vaginal discomfort.  No alleviating or worsening factors.  No dysuria, vaginal discharge, itching.  No AP, F, N/V.  She did try Monistat vaginal suppository yesterday due to concern for possible yeast infection. ? ?No known medical problems.  No prior similar sxs.  ? ?Sexually active with men.  Does not use protection.  No new partners. ? ?G2 P0-0-2-0 ?  ? ?Home Medications ?Prior to Admission medications   ?Medication Sig Start Date End Date Taking? Authorizing Provider  ?fluconazole (DIFLUCAN) 150 MG tablet Take 1 tablet (150 mg total) by mouth daily. 04/08/22  Yes Tilden Fossa, MD  ?acetaminophen (TYLENOL) 325 MG tablet Take 650 mg by mouth every 6 (six) hours as needed for headache.    [provider]  ?desonide (DESOWEN) 0.05 % ointment Apply 1 application topically 2 (two) times daily. Apply to the hands 03/29/18   Arthor Captain, PA-C  ?doxycycline (VIBRAMYCIN) 100 MG capsule Take 1 capsule (100 mg total) by mouth 2 (two) times daily. One po bid x 7 days 03/31/21   Melene Plan, DO  ?ferrous sulfate 325 (65 FE) MG tablet Take 1 tablet (325 mg total) by mouth daily with breakfast for 14 days. 12/06/18 12/20/18  Shaune Pollack, MD  ?ibuprofen (ADVIL) 600 MG tablet Take 1 tablet (600 mg total) by mouth every 6 (six) hours as needed. 03/17/21   Domenick Gong, MD  ?ketoconazole (NIZORAL) 2 % cream Apply 1 application topically daily. Apply to the vaginal region. 03/29/18   Arthor Captain, PA-C  ?metroNIDAZOLE (FLAGYL) 500 MG tablet Take 1 tablet (500 mg total) by mouth 2 (two) times daily.  03/31/21   Melene Plan, DO  ?phenazopyridine (PYRIDIUM) 200 MG tablet Take 1 tablet (200 mg total) by mouth 3 (three) times daily as needed for pain. 03/17/21   Domenick Gong, MD  ?   ? ?Allergies    ?Patient has no known allergies.   ? ?Review of Systems   ?Review of Systems  ?All other systems reviewed and are negative. ? ?Physical Exam ?Updated Vital Signs ?BP 103/81   Pulse 73   Temp 97.8 ?F (36.6 ?C) (Temporal)   Resp 14   LMP 03/31/2022 (Exact Date)   SpO2 100%  ?Physical Exam ?Vitals and nursing note reviewed.  ?Constitutional:   ?   Appearance: She is well-developed.  ?HENT:  ?   Head: Normocephalic and atraumatic.  ?Cardiovascular:  ?   Rate and Rhythm: Normal rate and regular rhythm.  ?Pulmonary:  ?   Effort: Pulmonary effort is normal. No respiratory distress.  ?Abdominal:  ?   Palpations: Abdomen is soft.  ?   Tenderness: There is no abdominal tenderness. There is no guarding or rebound.  ?Genitourinary: ?   Comments: Mild to moderate vulvar edema with thick white material.  Vaginal vault with thick white as well as thin discharge.  No CMT ?Musculoskeletal:     ?   General: No tenderness.  ?Skin: ?   General: Skin is warm and dry.  ?Neurological:  ?   Mental Status: She is alert and  oriented to person, place, and time.  ?Psychiatric:     ?   Behavior: Behavior normal.  ? ? ?ED Results / Procedures / Treatments   ?Labs ?(all labs ordered are listed, but only abnormal results are displayed) ?Labs Reviewed  ?URINALYSIS, ROUTINE W REFLEX MICROSCOPIC - Abnormal; Notable for the following components:  ?    Result Value  ? APPearance HAZY (*)   ? Specific Gravity, Urine 1.032 (*)   ? Protein, ur TRACE (*)   ? Leukocytes,Ua SMALL (*)   ? Bacteria, UA RARE (*)   ? All other components within normal limits  ?WET PREP, GENITAL  ?PREGNANCY, URINE  ?RPR  ?HIV ANTIBODY (ROUTINE TESTING W REFLEX)  ?GC/CHLAMYDIA PROBE AMP ([facility]) NOT AT [facility]  ? ? ?EKG ?None ? ?Radiology ?No results  found. ? ?Procedures ?Procedures  ? ? ?Medications Ordered in ED ?Medications - No data to display ? ?ED Course/ Medical Decision Making/ A&P ?  ?                        ?Medical Decision Making ?Amount and/or Complexity of Data Reviewed ?Labs: ordered. ? ?Risk ?Prescription drug management. ? ? ?Patient here for evaluation of vaginal discomfort for the last few days.  On examination she does have some irritation and swelling of her vulva.  She does have a large amount of thick discharge but she did use a vaginal suppository for possible yeast infection yesterday.  Wet prep is negative for clue cells, yeast.  Current exam is not consistent with serious bacterial infection, PID.  Discussed with patient likely candidal infection and that should improve with the vaginal suppository versus vaginitis due to soap irritation.  Will prescribe one-time dose of Diflucan that she is only to take if her symptoms do not improve over the next several days.  Discussed home care and return precautions. ? ? ? ? ? ? ? ?Final Clinical Impression(s) / ED Diagnoses ?Final diagnoses:  ?Acute vaginitis  ? ? ?Rx / DC Orders ?ED Discharge Orders   ? ?      Ordered  ?  fluconazole (DIFLUCAN) 150 MG tablet  Daily       ? 04/08/22 0029  ? ?  ?  ? ?  ? ? ?  ?Tilden Fossa, MD ?04/08/22 0124 ? ?

## 2022-04-07 NOTE — ED Triage Notes (Signed)
Pt reports vaginal "discomfort", worse during intercourse - stating "something just doesn't feel right" for past 2 days. ?

## 2022-04-08 LAB — URINALYSIS, ROUTINE W REFLEX MICROSCOPIC
Bilirubin Urine: NEGATIVE
Glucose, UA: NEGATIVE mg/dL
Hgb urine dipstick: NEGATIVE
Ketones, ur: NEGATIVE mg/dL
Nitrite: NEGATIVE
Specific Gravity, Urine: 1.032 — ABNORMAL HIGH (ref 1.005–1.030)
pH: 6.5 (ref 5.0–8.0)

## 2022-04-08 LAB — WET PREP, GENITAL
Clue Cells Wet Prep HPF POC: NONE SEEN
Sperm: NONE SEEN
Trich, Wet Prep: NONE SEEN
WBC, Wet Prep HPF POC: <10 (ref <10)
Yeast Wet Prep HPF POC: NONE SEEN

## 2022-04-08 LAB — PREGNANCY, URINE: Preg Test, Ur: NEGATIVE

## 2022-04-08 MED ORDER — FLUCONAZOLE 150 MG PO TABS: 150.0000 mg | ORAL_TABLET | Freq: Every day | ORAL | 0 refills | Status: DC

## 2022-04-09 LAB — GC/CHLAMYDIA PROBE AMP (~~LOC~~) NOT AT ARMC
Chlamydia: NEGATIVE
Comment: NEGATIVE
Comment: NORMAL
Neisseria Gonorrhea: NEGATIVE

## 2022-04-09 LAB — RPR: RPR Ser Ql: NONREACTIVE

## 2022-04-10 LAB — HIV ANTIBODY (ROUTINE TESTING W REFLEX): HIV Screen 4th Generation wRfx: NONREACTIVE

## 2022-04-19 ENCOUNTER — Encounter (HOSPITAL_BASED_OUTPATIENT_CLINIC_OR_DEPARTMENT_OTHER): Payer: Self-pay

## 2022-04-19 ENCOUNTER — Ambulatory Visit (HOSPITAL_BASED_OUTPATIENT_CLINIC_OR_DEPARTMENT_OTHER): Payer: No Typology Code available for payment source | Admitting: Nurse Practitioner

## 2022-04-24 ENCOUNTER — Telehealth: Payer: No Typology Code available for payment source | Admitting: Physician Assistant

## 2022-04-24 DIAGNOSIS — L700 Acne vulgaris: Secondary | ICD-10-CM

## 2022-04-24 MED ORDER — CLINDAMYCIN PHOS-BENZOYL PEROX 1-5 % EX GEL: Freq: Two times a day (BID) | CUTANEOUS | 0 refills | Status: DC

## 2022-04-24 NOTE — Progress Notes (Signed)
I have spent 5 minutes in review of e-visit questionnaire, review and updating patient chart, medical decision making and response to patient.   Mouhamadou Gittleman Cody Alynn Ellithorpe, PA-C    

## 2022-04-24 NOTE — Progress Notes (Signed)
We are sorry that you are experiencing this issue.  Here is how we plan to help!  Based on what you shared with me it looks like you have cystic acne.  Acne is a disorder of the hair follicles and oil glands (sebaceous glands). The sebaceous glands secrete oils to keep the skin moist.  When the glands get clogged, it can lead to pimples or cysts.  These cysts may become infected and leave scars. Acne is very common and normally occurs at puberty.  Acne is also inherited.  Your personal care plan consists of the following recommendations:  I recommend that you use a daily cleanser  You might try 2% topical salicylic acid pads or wipes.  Use the pads to daily cleanse your skin.  I have prescribed a topical gel with an antibiotic:  Clindamycin-benzoyl peroxide gel.  This gel should be applied to the affected areas twice a day.  Be sure to read the package insert to understand potential side effects.   If excessive dryness or peeling occurs, reduce dose frequency or concentration of the topical scrubs.  If excessive stinging or burning occurs, remove the topical gel with mild soap and water and resume at a lower dose the next day.  Remember oral antibiotics and topical acne treatments may increase your sensitivity to the sun!  HOME CARE: Do not squeeze pimples because that can often lead to infections, worse acne, and scars. Use a moisturizer that contains retinoid or fruit acids that may inhibit the development of new acne lesions. Although there is not a clear link that foods can cause acne, doctors do believe that too many sweets predispose you to skin problems.  GET HELP RIGHT AWAY IF: If your acne gets worse or is not better within 10 days. If you become depressed. If you become pregnant, discontinue medications and call your OB/GYN.  MAKE SURE YOU: Understand these instructions. Will watch your condition. Will get help right away if you are not doing well or get worse.  Thank you  for choosing an e-visit.  Your e-visit answers were reviewed by a board certified advanced clinical practitioner to complete your personal care plan. Depending upon the condition, your plan could have included both over the counter or prescription medications.  Please review your pharmacy choice. Make sure the pharmacy is open so you can pick up prescription now. If there is a problem, you may contact your provider through MyChart messaging and have the prescription routed to another pharmacy.  Your safety is important to us. If you have drug allergies check your prescription carefully.   For the next 24 hours you can use MyChart to ask questions about today's visit, request a non-urgent call back, or ask for a work or school excuse. You will get an email in the next two days asking about your experience. I hope that your e-visit has been valuable and will speed your recovery.  

## 2022-05-06 ENCOUNTER — Telehealth: Payer: No Typology Code available for payment source | Admitting: Nurse Practitioner

## 2022-05-06 DIAGNOSIS — L700 Acne vulgaris: Secondary | ICD-10-CM

## 2022-05-06 MED ORDER — CLINDAMYCIN PHOS-BENZOYL PEROX 1.2-2.5 % EX GEL: CUTANEOUS | 0 refills | Status: DC

## 2022-05-06 NOTE — Progress Notes (Signed)
We are sorry that you are experiencing this issue.  Here is how we plan to help!  Based on what you shared with me it looks like you have uncomplicated acne.  Acne is a disorder of the hair follicles and oil glands (sebaceous glands). The sebaceous glands secrete oils to keep the skin moist.  When the glands get clogged, it can lead to pimples or cysts.  These cysts may become infected and leave scars. Acne is very common and normally occurs at puberty.  Acne is also inherited.  Your personal care plan consists of the following recommendations:  I recommend that you use a daily cleanser  You might try an over the counter cleanser that has benzoyl peroxide.  I recommend that you start with a product that has 2.5% benzoyl peroxide.  Stronger concentrations have not been shown to be more effective.  I have prescribed a topical gel with an antibiotic:  Clindamycin-benzoyl peroxide gel.  This gel should be applied to the affected areas twice a day.  Be sure to read the package insert to understand potential side effects.  If excessive dryness or peeling occurs, reduce dose frequency or concentration of the topical scrubs.  If excessive stinging or burning occurs, remove the topical gel with mild soap and water and resume at a lower dose the next day.  Remember oral antibiotics and topical acne treatments may increase your sensitivity to the sun!  HOME CARE: Do not squeeze pimples because that can often lead to infections, worse acne, and scars. Use a moisturizer that contains retinoid or fruit acids that may inhibit the development of new acne lesions. Although there is not a clear link that foods can cause acne, doctors do believe that too many sweets predispose you to skin problems.  GET HELP RIGHT AWAY IF: If your acne gets worse or is not better within 10 days. If you become depressed. If you become pregnant, discontinue medications and call your OB/GYN.  MAKE SURE YOU: Understand these  instructions. Will watch your condition. Will get help right away if you are not doing well or get worse.  Thank you for choosing an e-visit.  Your e-visit answers were reviewed by a board certified advanced clinical practitioner to complete your personal care plan. Depending upon the condition, your plan could have included both over the counter or prescription medications.  Please review your pharmacy choice. Make sure the pharmacy is open so you can pick up prescription now. If there is a problem, you may contact your provider through [employer] and have the prescription routed to another pharmacy.  Your safety is important to [place]. If you have drug allergies check your prescription carefully.   For the next 24 hours you can use MyChart to ask questions about today's visit, request a non-urgent call back, or ask for a work or school excuse. You will get an email in the next two days asking about your experience. I hope that your e-visit has been valuable and will speed your recovery.   I have spent at least 5 minutes reviewing and documenting in the patient's chart.

## 2022-05-08 ENCOUNTER — Encounter (HOSPITAL_BASED_OUTPATIENT_CLINIC_OR_DEPARTMENT_OTHER): Payer: Self-pay | Admitting: Nurse Practitioner

## 2022-07-21 ENCOUNTER — Other Ambulatory Visit: Payer: Self-pay

## 2022-07-21 ENCOUNTER — Ambulatory Visit (HOSPITAL_COMMUNITY)
Admission: EM | Admit: 2022-07-21 | Discharge: 2022-07-21 | Disposition: A | Discharge status: Home / Self Care | Payer: BLUE CROSS/BLUE SHIELD | Attending: Family Medicine | Admitting: Family Medicine

## 2022-07-21 ENCOUNTER — Encounter (HOSPITAL_COMMUNITY): Payer: Self-pay | Admitting: *Deleted

## 2022-07-21 DIAGNOSIS — Z20822 Contact with and (suspected) exposure to covid-19: Secondary | ICD-10-CM | POA: Insufficient documentation

## 2022-07-21 DIAGNOSIS — J069 Acute upper respiratory infection, unspecified: Secondary | ICD-10-CM | POA: Diagnosis present

## 2022-07-21 DIAGNOSIS — J029 Acute pharyngitis, unspecified: Secondary | ICD-10-CM | POA: Diagnosis present

## 2022-07-21 LAB — POCT RAPID STREP A, ED / UC: Streptococcus, Group A Screen (Direct): NEGATIVE

## 2022-07-21 MED ORDER — IBUPROFEN 800 MG PO TABS
800.0000 mg | ORAL_TABLET | Freq: Once | ORAL | Status: AC
  Administered 2022-07-21: 800 mg via ORAL

## 2022-07-21 MED ORDER — GUAIFENESIN ER 1200 MG PO TB12: 1200.0000 mg | ORAL_TABLET | Freq: Two times a day (BID) | ORAL | 0 refills | Status: DC

## 2022-07-21 MED ORDER — IBUPROFEN 800 MG PO TABS
ORAL_TABLET | ORAL | Status: AC
  Filled 2022-07-21: qty 1

## 2022-07-21 MED ORDER — ACETAMINOPHEN 500 MG PO TABS: 1000.0000 mg | ORAL_TABLET | Freq: Four times a day (QID) | ORAL | 0 refills | Status: DC | PRN

## 2022-07-21 MED ORDER — IBUPROFEN 600 MG PO TABS: 600.0000 mg | ORAL_TABLET | Freq: Four times a day (QID) | ORAL | 0 refills | Status: DC | PRN

## 2022-07-21 NOTE — Discharge Instructions (Addendum)
You have a viral upper respiratory infection.  Strep testing is negative.  COVID-19 testing is pending.  I have sent your strep test for culture to make sure that it does not grow any bacteria. Work note was at the end of your packet.  Take guaifenesin 1200mg   2 times daily to thin your mucous so that you can cough it up and blow it out of your nose easier. Drink plenty of water while taking this medication so that it works well in your body (at least 8 cups a day).   You may take tylenol 1,000mg  and ibuprofen 600mg  every 6 hours with food as needed for fever/chills, sore throat, aches/pains, and inflammation associated with viral illness. Take this with food to avoid stomach upset.    Your next dose of tylenol may be when you get home.  Your next dose of ibuprofen may be at 10 PM tonight with food (can even be applesauce).  You may do salt water and baking soda gargles every 4 hours as needed for your throat pain.  Please put 1 teaspoon of salt and 1/2 teaspoon of baking soda in 8 ounces of warm water then gargle and spit the water out. You may also put 1 tablespoon of honey in warm water and drink this to soothe your throat.  Place a humidifier in your room at night to help decrease dry air that can irritate your airway and cause you to have a sore throat and cough.  Please try to eat a well-balanced diet while you are sick so that your body gets proper nutrition to heal.  If you develop any new or worsening symptoms, please return.  If your symptoms are severe, please go to the emergency room.  Follow-up with your primary care provider for further evaluation and management of your symptoms as well as ongoing wellness visits.  I hope you feel better!

## 2022-07-21 NOTE — ED Triage Notes (Signed)
PT woke up a sore throat yesterday.

## 2022-07-21 NOTE — ED Provider Notes (Signed)
[facility]    CSN: [id number] Arrival date & time: 07/21/22  1137      History   Chief Complaint Chief Complaint  Patient presents with   Sore Throat    HPI April Wallace is a 26 y.o. female.   Patient presents to urgent care for evaluation of sore throat that started overnight last night with associated mild cough.  Cough is productive with white phlegm and patient states that she sometimes has a stuffy nose mostly when she coughs.  She is not having any difficulty breathing, chest pain, nausea, vomiting, ear pain, headache, eye drainage, abdominal pain, urinary symptoms, dizziness, or fever/chills at home.  No known sick contacts reported.  She has not taken any medications prior to arrival urgent care for her symptoms.  She has been vaccinated for COVID-19 but has not received any boosters.  She states that she has never had COVID-19 in the past.  It hurts to swallow and her throat pain is currently a 6 on a scale of 0-10.  She has not noticed any white patches to the back of her throat or any swelling.  No worsening fatigue or body aches reported.  No other aggravating or relieving factors identified at this time per patient symptoms.   Sore Throat    Past Medical History:  Diagnosis Date   Headache     Patient Active Problem List   Diagnosis Date Noted   Anemia affecting pregnancy in first trimester 11/12/2017   Bacterial vaginitis 10/30/2017   Candida vaginitis 10/30/2017   Abdominal cramping affecting pregnancy 10/30/2017   Morning sickness 10/16/2017    Past Surgical History:  Procedure Laterality Date   INDUCED ABORTION     WISDOM TOOTH EXTRACTION      OB History     Gravida  3   Para      Term      Preterm      AB  2   Living         SAB  1   IAB      Ectopic      Multiple      Live Births               Home Medications    Prior to Admission medications   Medication Sig Start Date End Date Taking?  Authorizing Provider  acetaminophen (TYLENOL) 500 MG tablet Take 2 tablets (1,000 mg total) by mouth every 6 (six) hours as needed. 07/21/22  Yes Carlisle Beers, FNP  Guaifenesin 1200 MG TB12 Take 1 tablet (1,200 mg total) by mouth in the morning and at bedtime. 07/21/22  Yes Carlisle Beers, FNP  Clindamycin Phos-Benzoyl Perox gel Apply to the affected areas twice daily. 05/06/22   Leath-Warren, Sadie Haber, NP  clindamycin-benzoyl peroxide (BENZACLIN) gel Apply topically 2 (two) times daily. 04/24/22   Waldon Merl, PA-C  ferrous sulfate 325 (65 FE) MG tablet Take 1 tablet (325 mg total) by mouth daily with breakfast for 14 days. 12/06/18 12/20/18  Shaune Pollack, MD  ibuprofen (ADVIL) 600 MG tablet Take 1 tablet (600 mg total) by mouth every 6 (six) hours as needed. 07/21/22   Carlisle Beers, FNP  phenazopyridine (PYRIDIUM) 200 MG tablet Take 1 tablet (200 mg total) by mouth 3 (three) times daily as needed for pain. 03/17/21   Domenick Gong, MD    Family History Family History  Problem Relation Age of Onset   Diabetes Maternal Grandmother  Seizures Maternal Grandmother    Asthma Maternal Grandmother    Cancer Maternal Grandfather    Healthy Mother    Healthy Father     Social History Social History   Tobacco Use   Smoking status: Never   Smokeless tobacco: Never  Substance Use Topics   Alcohol use: Yes    Comment: occ   Drug use: No     Allergies   Patient has no known allergies.   Review of Systems Review of Systems Per HPI  Physical Exam Triage Vital Signs ED Triage Vitals  Enc Vitals Group     BP 07/21/22 1231 108/76     Pulse Rate 07/21/22 1231 79     Resp 07/21/22 1231 18     Temp 07/21/22 1231 98.4 F (36.9 C)     Temp src --      SpO2 07/21/22 1231 99 %     Weight --      Height --      Head Circumference --      Peak Flow --      Pain Score 07/21/22 1228 8     Pain Loc --      Pain Edu? --      Excl. in GC? --    No data  found.  Updated Vital Signs BP 108/76   Pulse 79   Temp 98.4 F (36.9 C)   Resp 18   SpO2 99%   Visual Acuity Right Eye Distance:   Left Eye Distance:   Bilateral Distance:    Right Eye Near:   Left Eye Near:    Bilateral Near:     Physical Exam Vitals and nursing note reviewed.  Constitutional:      Appearance: Normal appearance. She is not ill-appearing or toxic-appearing.     Comments: Very pleasant patient sitting on exam in position of comfort table in no acute distress.   HENT:     Head: Normocephalic and atraumatic.     Right Ear: Hearing, tympanic membrane, ear canal and external ear normal.     Left Ear: Hearing, tympanic membrane, ear canal and external ear normal.     Nose: Congestion present.     Mouth/Throat:     Lips: Pink.     Mouth: Mucous membranes are moist.     Pharynx: Posterior oropharyngeal erythema present.  Eyes:     General: Lids are normal. Vision grossly intact. Gaze aligned appropriately.        Right eye: No discharge.        Left eye: No discharge.     Extraocular Movements: Extraocular movements intact.     Conjunctiva/sclera: Conjunctivae normal.     Pupils: Pupils are equal, round, and reactive to light.  Cardiovascular:     Rate and Rhythm: Normal rate and regular rhythm.     Heart sounds: Normal heart sounds, S1 normal and S2 normal.  Pulmonary:     Effort: Pulmonary effort is normal. No respiratory distress.     Breath sounds: Normal breath sounds and air entry.     Comments: Clear to auscultation bilaterally. Abdominal:     General: Bowel sounds are normal.     Palpations: Abdomen is soft.     Tenderness: There is no abdominal tenderness. There is no right CVA tenderness, left CVA tenderness or guarding.  Musculoskeletal:     Cervical back: Neck supple.  Skin:    General: Skin is warm and dry.     Capillary  Refill: Capillary refill takes less than 2 seconds.     Findings: No rash.  Neurological:     General: No focal  deficit present.     Mental Status: She is alert and oriented to person, place, and time. Mental status is at baseline.     Cranial Nerves: No dysarthria or facial asymmetry.     Motor: No weakness.     Gait: Gait is intact. Gait normal.  Psychiatric:        Mood and Affect: Mood normal.        Speech: Speech normal.        Behavior: Behavior normal.        Thought Content: Thought content normal.        Judgment: Judgment normal.      UC Treatments / Results  Labs (all labs ordered are listed, but only abnormal results are displayed) Labs Reviewed  SARS CORONAVIRUS 2 (TAT 6-24 HRS)  CULTURE, GROUP A STREP [facility])  POCT RAPID STREP A, ED / UC    EKG   Radiology No results found.  Procedures Procedures (including critical care time)  Medications Ordered in UC Medications  ibuprofen (ADVIL) tablet 800 mg (800 mg Oral Given 07/21/22 1345)    Initial Impression / Assessment and Plan / UC Course  I have reviewed the triage vital signs and the nursing notes.  Pertinent labs & imaging results that were available during my care of the patient were reviewed by me and considered in my medical decision making (see chart for details).  1.  Sore throat and viral upper respiratory tract infection  Symptomology and physical exam are consistent with viral upper respiratory infection with cough.  We will manage this with supportive care prescriptions for symptomatic relief.  Deferred imaging based on stable cardiopulmonary exam and hemodynamically stable vital signs at this time.  Guaifenesin twice daily to thin mucus prescribed.  Encourage patient to increase water intake while taking this medication and while recovering from viral illness.  Tylenol 1000 mg and ibuprofen 600 mg every 6 hours to be taken with food for fever/chills, sore throat, aches and pains, and fever/chills.  Nonpharmacologic methods of sore throat relief provided and after visit summary.  800mg  ibuprofen given in clinic  to treat sore throat. No ibuprofen for 8 hours advised due to administration in clinic. COVID-19 testing is pending. Throat culture is also pending. Will call patient with positive results. Patient agreeable with this plan.   Discussed physical exam and available lab work findings in clinic with patient.  Counseled patient regarding appropriate use of medications and potential side effects for all medications recommended or prescribed today. Discussed red flag signs and symptoms of worsening condition,when to call the PCP office, return to urgent care, and when to seek higher level of care in the emergency department. Patient verbalizes understanding and agreement with plan. All questions answered. Patient discharged in stable condition.  Final Clinical Impressions(s) / UC Diagnoses   Final diagnoses:  Sore throat  Viral upper respiratory tract infection     Discharge Instructions      You have a viral upper respiratory infection.  Strep testing is negative.  COVID-19 testing is pending.  I have sent your strep test for culture to make sure that it does not grow any bacteria. Work note was at the end of your packet.  Take guaifenesin 1200mg   2 times daily to thin your mucous so that you can cough it up and blow it out  of your nose easier. Drink plenty of water while taking this medication so that it works well in your body (at least 8 cups a day).   You may take tylenol 1,000mg  and ibuprofen 600mg  every 6 hours with food as needed for fever/chills, sore throat, aches/pains, and inflammation associated with viral illness. Take this with food to avoid stomach upset.    Your next dose of tylenol may be when you get home.  Your next dose of ibuprofen may be at 10 PM tonight with food (can even be applesauce).  You may do salt water and baking soda gargles every 4 hours as needed for your throat pain.  Please put 1 teaspoon of salt and 1/2 teaspoon of baking soda in 8 ounces of warm water then  gargle and spit the water out. You may also put 1 tablespoon of honey in warm water and drink this to soothe your throat.  Place a humidifier in your room at night to help decrease dry air that can irritate your airway and cause you to have a sore throat and cough.  Please try to eat a well-balanced diet while you are sick so that your body gets proper nutrition to heal.  If you develop any new or worsening symptoms, please return.  If your symptoms are severe, please go to the emergency room.  Follow-up with your primary care provider for further evaluation and management of your symptoms as well as ongoing wellness visits.  I hope you feel better!      ED Prescriptions     Medication Sig Dispense Auth. Provider   acetaminophen (TYLENOL) 500 MG tablet Take 2 tablets (1,000 mg total) by mouth every 6 (six) hours as needed. 30 tablet M, FNP   Guaifenesin 1200 MG TB12 Take 1 tablet (1,200 mg total) by mouth in the morning and at bedtime. 14 tablet M M, FNP   ibuprofen (ADVIL) 600 MG tablet Take 1 tablet (600 mg total) by mouth every 6 (six) hours as needed. 30 tablet M, FNP      PDMP not reviewed this encounter.   Carlisle Beers, Carlisle Beers 07/21/22 1426

## 2022-07-22 LAB — SARS CORONAVIRUS 2 (TAT 6-24 HRS): SARS Coronavirus 2: NEGATIVE

## 2022-07-23 LAB — CULTURE, GROUP A STREP (THRC)

## 2022-09-19 ENCOUNTER — Encounter (HOSPITAL_COMMUNITY): Payer: Self-pay | Admitting: Emergency Medicine

## 2022-09-19 ENCOUNTER — Ambulatory Visit (HOSPITAL_COMMUNITY)
Admission: EM | Admit: 2022-09-19 | Discharge: 2022-09-19 | Disposition: A | Discharge status: Home / Self Care | Payer: BLUE CROSS/BLUE SHIELD | Attending: Internal Medicine | Admitting: Internal Medicine

## 2022-09-19 DIAGNOSIS — R1030 Lower abdominal pain, unspecified: Secondary | ICD-10-CM

## 2022-09-19 DIAGNOSIS — Z113 Encounter for screening for infections with a predominantly sexual mode of transmission: Secondary | ICD-10-CM | POA: Diagnosis not present

## 2022-09-19 DIAGNOSIS — R350 Frequency of micturition: Secondary | ICD-10-CM | POA: Diagnosis present

## 2022-09-19 DIAGNOSIS — Z3202 Encounter for pregnancy test, result negative: Secondary | ICD-10-CM

## 2022-09-19 LAB — POCT URINALYSIS DIPSTICK, ED / UC
Bilirubin Urine: NEGATIVE
Glucose, UA: NEGATIVE mg/dL
Ketones, ur: NEGATIVE mg/dL
Leukocytes,Ua: NEGATIVE
Nitrite: NEGATIVE
Protein, ur: NEGATIVE mg/dL
Specific Gravity, Urine: 1.025 (ref 1.005–1.030)
Urobilinogen, UA: 0.2 mg/dL (ref 0.0–1.0)
pH: 6.5 (ref 5.0–8.0)

## 2022-09-19 LAB — POC URINE PREG, ED: Preg Test, Ur: NEGATIVE

## 2022-09-19 NOTE — ED Provider Notes (Signed)
[facility]    CSN: [id number] Arrival date & time: 09/19/22  1206      History   Chief Complaint Chief Complaint  Patient presents with   Abdominal Pain    HPI April Wallace is a 26 y.o. female.   Patient presents urgent care for evaluation of generalized abdominal discomfort that is mostly to the lower abdomen and consistent with menstrual cramping sensation.  She states that her period is about to start and she believes that it started while she was at urgent care as she noticed blood while performing the vaginal swab.  Abdominal cramping is mild at this time.  She is also reporting concern for STD as she just found out that her ex-boyfriend was unfaithful during their relationship.  She is not currently having any vaginal discharge, odor, itch, or rash.  She is uninterested in HIV and syphilis testing and states that she had this done in April.  No nausea, vomiting, dizziness, fever/chills, or flank pain.  She reports urinary frequency without dysuria, hesitancy, or urgency.  She has not attempted use of any over-the-counter medications prior to arrival urgent care for symptoms and is concerned that she may be pregnant.   Abdominal Pain   Past Medical History:  Diagnosis Date   Headache     Patient Active Problem List   Diagnosis Date Noted   Anemia affecting pregnancy in first trimester 11/12/2017   Bacterial vaginitis 10/30/2017   Candida vaginitis 10/30/2017   Abdominal cramping affecting pregnancy 10/30/2017   Morning sickness 10/16/2017    Past Surgical History:  Procedure Laterality Date   INDUCED ABORTION     WISDOM TOOTH EXTRACTION      OB History     Gravida  3   Para      Term      Preterm      AB  2   Living         SAB  1   IAB      Ectopic      Multiple      Live Births               Home Medications    Prior to Admission medications   Medication Sig Start Date End Date Taking? Authorizing Provider   acetaminophen (TYLENOL) 500 MG tablet Take 2 tablets (1,000 mg total) by mouth every 6 (six) hours as needed. 07/21/22   Carlisle Beers, FNP  Clindamycin Phos-Benzoyl Perox gel Apply to the affected areas twice daily. 05/06/22   Leath-Warren, Sadie Haber, NP  clindamycin-benzoyl peroxide (BENZACLIN) gel Apply topically 2 (two) times daily. 04/24/22   Waldon Merl, PA-C  ferrous sulfate 325 (65 FE) MG tablet Take 1 tablet (325 mg total) by mouth daily with breakfast for 14 days. 12/06/18 12/20/18  Shaune Pollack, MD  Guaifenesin 1200 MG TB12 Take 1 tablet (1,200 mg total) by mouth in the morning and at bedtime. 07/21/22   Carlisle Beers, FNP  ibuprofen (ADVIL) 600 MG tablet Take 1 tablet (600 mg total) by mouth every 6 (six) hours as needed. 07/21/22   Carlisle Beers, FNP  phenazopyridine (PYRIDIUM) 200 MG tablet Take 1 tablet (200 mg total) by mouth 3 (three) times daily as needed for pain. 03/17/21   Domenick Gong, MD    Family History Family History  Problem Relation Age of Onset   Diabetes Maternal Grandmother    Seizures Maternal Grandmother    Asthma Maternal Grandmother  Cancer Maternal Grandfather    Healthy Mother    Healthy Father     Social History Social History   Tobacco Use   Smoking status: Never   Smokeless tobacco: Never  Substance Use Topics   Alcohol use: Yes    Comment: occ   Drug use: No     Allergies   Patient has no known allergies.   Review of Systems Review of Systems  Gastrointestinal:  Positive for abdominal pain.  Per HPI   Physical Exam Triage Vital Signs ED Triage Vitals  Enc Vitals Group     BP 09/19/22 1228 103/60     Pulse Rate 09/19/22 1228 80     Resp 09/19/22 1228 17     Temp 09/19/22 1228 98.6 F (37 C)     Temp Source 09/19/22 1228 Oral     SpO2 09/19/22 1228 99 %     Weight --      Height --      Head Circumference --      Peak Flow --      Pain Score 09/19/22 1230 0     Pain Loc --      Pain Edu?  --      Excl. in GC? --    No data found.  Updated Vital Signs BP 103/60 (BP Location: Left Arm)   Pulse 80   Temp 98.6 F (37 C) (Oral)   Resp 17   SpO2 99%   Visual Acuity Right Eye Distance:   Left Eye Distance:   Bilateral Distance:    Right Eye Near:   Left Eye Near:    Bilateral Near:     Physical Exam Vitals and nursing note reviewed.  Constitutional:      Appearance: She is not ill-appearing or toxic-appearing.  HENT:     Head: Normocephalic and atraumatic.     Right Ear: Hearing and external ear normal.     Left Ear: Hearing and external ear normal.     Nose: Nose normal.     Mouth/Throat:     Lips: Pink.  Eyes:     General: Lids are normal. Vision grossly intact. Gaze aligned appropriately.     Extraocular Movements: Extraocular movements intact.     Conjunctiva/sclera: Conjunctivae normal.  Pulmonary:     Effort: Pulmonary effort is normal.  Abdominal:     General: Bowel sounds are normal.     Palpations: Abdomen is soft.     Tenderness: There is no abdominal tenderness. There is no right CVA tenderness, left CVA tenderness or guarding.  Genitourinary:    Comments: Deferred. Musculoskeletal:     Cervical back: Neck supple.  Skin:    General: Skin is warm and dry.     Capillary Refill: Capillary refill takes less than 2 seconds.     Findings: No rash.  Neurological:     General: No focal deficit present.     Mental Status: She is alert and oriented to person, place, and time. Mental status is at baseline.     Cranial Nerves: No dysarthria or facial asymmetry.  Psychiatric:        Mood and Affect: Mood normal.        Speech: Speech normal.        Behavior: Behavior normal.        Thought Content: Thought content normal.        Judgment: Judgment normal.      UC Treatments / Results  Labs (all  labs ordered are listed, but only abnormal results are displayed) Labs Reviewed  POCT URINALYSIS DIPSTICK, ED / UC - Abnormal; Notable for the  following components:      Result Value   Hgb urine dipstick TRACE (*)    All other components within normal limits  POC URINE PREG, ED    EKG   Radiology No results found.  Procedures Procedures (including critical care time)  Medications Ordered in UC Medications - No data to display  Initial Impression / Assessment and Plan / UC Course  I have reviewed the triage vital signs and the nursing notes.  Pertinent labs & imaging results that were available during my care of the patient were reviewed by me and considered in my medical decision making (see chart for details).   1.  Negative pregnancy test and lower abdominal pain Urine pregnancy is negative in the clinic.  Lower abdominal pain is likely related to menstrual cramping and will improve with Tylenol and ibuprofen over-the-counter as needed.  Advised heat to the abdomen as well to reduce abdominal cramping associated with menses.  Urinalysis is unremarkable for signs of urinary tract infection.  Advised patient to increase water intake to stay well-hydrated.   2.  Screen for STD STI labs pending.  Patient declines HIV and syphilis testing today.  Will notify patient of positive results and treat accordingly when labs come back.  Patient to avoid sexual intercourse until screening testing comes back.  Education provided regarding safe sexual practices and patient encouraged to use protection to prevent spread of STIs.   Discussed physical exam and available lab work findings in clinic with patient.  Counseled patient regarding appropriate use of medications and potential side effects for all medications recommended or prescribed today. Discussed red flag signs and symptoms of worsening condition,when to call the PCP office, return to urgent care, and when to seek higher level of care in the emergency department. Patient verbalizes understanding and agreement with plan. All questions answered. Patient discharged in stable  condition.    Final Clinical Impressions(s) / UC Diagnoses   Final diagnoses:  Negative pregnancy test  Lower abdominal pain  Urinary frequency  Screen for STD (sexually transmitted disease)     Discharge Instructions      Your STD testing has been sent to the lab and will come back in the next 2 to 3 days.  We will call you if any of your results are positive requiring treatment and treat you at that time.   Avoid sexual intercourse until your STD results come back.  If any of your STD results are positive, you will need to avoid sexual intercourse for 7 days while you are being treated to prevent spread of STD.  Condom use is the best way to prevent spread of STDs.  Return to urgent care as needed.     ED Prescriptions   None    PDMP not reviewed this encounter.   Talbot Grumbling, [place] 09/19/22 1322

## 2022-09-19 NOTE — ED Triage Notes (Signed)
Pt reports lower abdominal pain for the past day or two. States she received the depo shot in June or July and was supposed to get it in August and did not get it.  Also requesting STD testing.

## 2022-09-19 NOTE — Discharge Instructions (Signed)
Your STD testing has been sent to the lab and will come back in the next 2 to 3 days.  We will call you if any of your results are positive requiring treatment and treat you at that time.   Avoid sexual intercourse until your STD results come back.  If any of your STD results are positive, you will need to avoid sexual intercourse for 7 days while you are being treated to prevent spread of STD.  Condom use is the best way to prevent spread of STDs.  Return to urgent care as needed.  

## 2022-09-20 ENCOUNTER — Telehealth (HOSPITAL_COMMUNITY): Payer: Self-pay | Admitting: Emergency Medicine

## 2022-09-20 ENCOUNTER — Ambulatory Visit (HOSPITAL_COMMUNITY)
Admission: EM | Admit: 2022-09-20 | Discharge: 2022-09-20 | Disposition: A | Discharge status: Home / Self Care | Payer: BLUE CROSS/BLUE SHIELD | Attending: Internal Medicine | Admitting: Internal Medicine

## 2022-09-20 DIAGNOSIS — A549 Gonococcal infection, unspecified: Secondary | ICD-10-CM | POA: Diagnosis not present

## 2022-09-20 LAB — CERVICOVAGINAL ANCILLARY ONLY
Bacterial Vaginitis (gardnerella): NEGATIVE
Candida Glabrata: NEGATIVE
Candida Vaginitis: NEGATIVE
Chlamydia: NEGATIVE
Comment: NEGATIVE
Comment: NEGATIVE
Comment: NEGATIVE
Comment: NEGATIVE
Comment: NEGATIVE
Comment: NORMAL
Neisseria Gonorrhea: POSITIVE — AB
Trichomonas: NEGATIVE

## 2022-09-20 MED ORDER — LIDOCAINE HCL (PF) 1 % IJ SOLN
INTRAMUSCULAR | Status: AC
  Filled 2022-09-20: qty 2

## 2022-09-20 MED ORDER — CEFTRIAXONE SODIUM 500 MG IJ SOLR
500.0000 mg | Freq: Once | INTRAMUSCULAR | Status: AC
  Administered 2022-09-20: 500 mg via INTRAMUSCULAR

## 2022-09-20 MED ORDER — CEFTRIAXONE SODIUM 500 MG IJ SOLR
INTRAMUSCULAR | Status: AC
  Filled 2022-09-20: qty 500

## 2022-09-20 NOTE — Telephone Encounter (Signed)
Per protocol, patient will need treatment with IM Rocephin 500mg for positive Gonorrhea.   Attempted to reach patient x 1, [facility] notified 

## 2022-09-20 NOTE — ED Notes (Signed)
Pt here today for rocephin injection. She tolerated it well

## 2022-10-08 ENCOUNTER — Emergency Department (HOSPITAL_COMMUNITY)
Admission: EM | Admit: 2022-10-08 | Discharge: 2022-10-08 | Discharge status: Against Medical Advice | Payer: BLUE CROSS/BLUE SHIELD | Attending: Emergency Medicine | Admitting: Emergency Medicine

## 2022-10-08 ENCOUNTER — Encounter (HOSPITAL_COMMUNITY): Payer: Self-pay

## 2022-10-08 DIAGNOSIS — N939 Abnormal uterine and vaginal bleeding, unspecified: Secondary | ICD-10-CM | POA: Insufficient documentation

## 2022-10-08 DIAGNOSIS — Z5321 Procedure and treatment not carried out due to patient leaving prior to being seen by health care provider: Secondary | ICD-10-CM | POA: Diagnosis not present

## 2022-10-08 HISTORY — DX: Anemia, unspecified: D64.9

## 2022-10-08 LAB — BASIC METABOLIC PANEL
Anion gap: 7 (ref 5–15)
BUN: 14 mg/dL (ref 6–20)
CO2: 23 mmol/L (ref 22–32)
Calcium: 9.4 mg/dL (ref 8.9–10.3)
Chloride: 107 mmol/L (ref 98–111)
Creatinine, Ser: 0.72 mg/dL (ref 0.44–1.00)
GFR, Estimated: >60 mL/min (ref >60)
Glucose, Bld: 71 mg/dL (ref 70–99)
Potassium: 3.5 mmol/L (ref 3.5–5.1)
Sodium: 137 mmol/L (ref 135–145)

## 2022-10-08 LAB — I-STAT BETA HCG BLOOD, ED (MC, WL, AP ONLY): I-stat hCG, quantitative: <5 m[IU]/mL (ref <5)

## 2022-10-08 LAB — CBC
HCT: 29.6 % — ABNORMAL LOW (ref 36.0–46.0)
Hemoglobin: 8.9 g/dL — ABNORMAL LOW (ref 12.0–15.0)
MCH: 23.3 pg — ABNORMAL LOW (ref 26.0–34.0)
MCHC: 30.1 g/dL (ref 30.0–36.0)
MCV: 77.5 fL — ABNORMAL LOW (ref 80.0–100.0)
Platelets: 237 10*3/uL (ref 150–400)
RBC: 3.82 MIL/uL — ABNORMAL LOW (ref 3.87–5.11)
RDW: 16.3 % — ABNORMAL HIGH (ref 11.5–15.5)
WBC: 5.7 10*3/uL (ref 4.0–10.5)
nRBC: 0 % (ref 0.0–0.2)

## 2022-10-08 LAB — TYPE AND SCREEN
ABO/RH(D): O POS
Antibody Screen: NEGATIVE

## 2022-10-08 NOTE — ED Notes (Signed)
X1 no response for vitals recheck 

## 2022-10-08 NOTE — ED Notes (Signed)
X2 no response for vitals 

## 2022-10-08 NOTE — ED Triage Notes (Signed)
Pt states that she she has been having her menstrual cycle for approximately three weeks. Changing tampons q1-2h. Pt states she has hx of anemia. Pt also c/o generalized fatigue. No SOB, dizziness at this time.

## 2022-10-08 NOTE — ED Provider Triage Note (Signed)
Emergency Medicine Provider Triage Evaluation Note  April Wallace , a 26 y.o. female  was evaluated in triage.  Pt complains of vaginal bleeding. Hx of heavy menses. States she's had 3 weeks of bleeding now. She states her cycle has become more heavy with time.   Denies any abd pain. No nausea or vomiting.   Recent negative pregnancy test.   Some headaches. No SOB or LH  Review of Systems  Positive: Vaginal bleeding Negative: Fever   Physical Exam  BP 100/71 (BP Location: Right Arm)   Pulse 89   Temp 98.2 F (36.8 C) (Oral)   Resp 17   Ht 5\' 4"  (1.626 m)   Wt 52.2 kg   LMP  (Within Weeks)   SpO2 100%   BMI 19.74 kg/m  Gen:   Awake, no distress   Resp:  Normal effort  MSK:   Moves extremities without difficulty  Other:  Abd soft NTTP  Medical Decision Making  Medically screening exam initiated at 3:32 PM.  Appropriate orders placed.  April Wallace was informed that the remainder of the evaluation will be completed by another provider, this initial triage assessment does not replace that evaluation, and the importance of remaining in the ED until their evaluation is complete.  [address]    Pati Gallo [place], [place] 10/08/22 1534

## 2022-10-18 ENCOUNTER — Encounter (HOSPITAL_COMMUNITY): Payer: Self-pay | Admitting: *Deleted

## 2022-10-18 ENCOUNTER — Ambulatory Visit (HOSPITAL_COMMUNITY)
Admission: EM | Admit: 2022-10-18 | Discharge: 2022-10-18 | Disposition: A | Discharge status: Home / Self Care | Payer: BLUE CROSS/BLUE SHIELD | Attending: Internal Medicine | Admitting: Internal Medicine

## 2022-10-18 DIAGNOSIS — N898 Other specified noninflammatory disorders of vagina: Secondary | ICD-10-CM | POA: Insufficient documentation

## 2022-10-18 NOTE — ED Triage Notes (Signed)
Pt states she is having brownish vaginal discharge since Tuesday. She denies any unprotected sex or known exposures.

## 2022-10-18 NOTE — Discharge Instructions (Signed)
Your brown vaginal discharge may be related to further uterine bleeding and may be related to STD.   We sent of your STD testing again today and will come back in the next 2-3 days.   Follow-up with an OB/GYN for further evaluation of irregular menstrual cycles and bleeding.  Return to urgent care as needed.

## 2022-10-18 NOTE — ED Provider Notes (Signed)
[facility]    CSN: [id number] Arrival date & time: 10/18/22  1927      History   Chief Complaint Chief Complaint  Patient presents with   Vaginal Discharge    HPI April Wallace is a 26 y.o. female.   Patient presents to urgent care for evaluation of brown vaginal discharge that started on 2 days ago.  States her menstrual cycles are irregular and her last menstrual cycle lasted for approximately 3 weeks and ended last Thursday on October 11, 2022 (5 days ago).  She does not use any forms of contraception and is sexually active with female partner.  She denies recent unprotected sexual intercourse, concern for STD, and urinary symptoms.  No abdominal pain, nausea, vomiting, low back pain, diarrhea, constipation, weakness, worsening fatigue, body aches, fever/chills, vaginal itching, vaginal odor, or vaginal rash.  She has never been evaluated by an OB/GYN for her regular menstrual cycle in the past.  She states that the brown discharge appears similar to the discharge that is present with menstrual spotting.   Vaginal Discharge   Past Medical History:  Diagnosis Date   Anemia    Headache     Patient Active Problem List   Diagnosis Date Noted   Anemia affecting pregnancy in first trimester 11/12/2017   Bacterial vaginitis 10/30/2017   Candida vaginitis 10/30/2017   Abdominal cramping affecting pregnancy 10/30/2017   Morning sickness 10/16/2017    Past Surgical History:  Procedure Laterality Date   INDUCED ABORTION     WISDOM TOOTH EXTRACTION      OB History     Gravida  3   Para      Term      Preterm      AB  2   Living         SAB  1   IAB      Ectopic      Multiple      Live Births               Home Medications    Prior to Admission medications   Medication Sig Start Date End Date Taking? Authorizing Provider  acetaminophen (TYLENOL) 500 MG tablet Take 2 tablets (1,000 mg total) by mouth every 6 (six) hours as  needed. 07/21/22   Carlisle Beers, FNP  Clindamycin Phos-Benzoyl Perox gel Apply to the affected areas twice daily. 05/06/22   Leath-Warren, Sadie Haber, NP  clindamycin-benzoyl peroxide (BENZACLIN) gel Apply topically 2 (two) times daily. 04/24/22   Waldon Merl, PA-C  ferrous sulfate 325 (65 FE) MG tablet Take 1 tablet (325 mg total) by mouth daily with breakfast for 14 days. 12/06/18 12/20/18  Shaune Pollack, MD  Guaifenesin 1200 MG TB12 Take 1 tablet (1,200 mg total) by mouth in the morning and at bedtime. 07/21/22   Carlisle Beers, FNP  ibuprofen (ADVIL) 600 MG tablet Take 1 tablet (600 mg total) by mouth every 6 (six) hours as needed. 07/21/22   Carlisle Beers, FNP  phenazopyridine (PYRIDIUM) 200 MG tablet Take 1 tablet (200 mg total) by mouth 3 (three) times daily as needed for pain. 03/17/21   Domenick Gong, MD    Family History Family History  Problem Relation Age of Onset   Diabetes Maternal Grandmother    Seizures Maternal Grandmother    Asthma Maternal Grandmother    Cancer Maternal Grandfather    Healthy Mother    Healthy Father     Social History Social  History   Tobacco Use   Smoking status: Never   Smokeless tobacco: Never  Vaping Use   Vaping Use: Never used  Substance Use Topics   Alcohol use: Yes    Comment: occ   Drug use: No     Allergies   Patient has no known allergies.   Review of Systems Review of Systems  Genitourinary:  Positive for vaginal discharge.  Per HPI   Physical Exam Triage Vital Signs ED Triage Vitals  Enc Vitals Group     BP 10/18/22 1950 103/70     Pulse Rate 10/18/22 1950 74     Resp 10/18/22 1950 18     Temp 10/18/22 1950 98 F (36.7 C)     Temp Source 10/18/22 1950 Oral     SpO2 10/18/22 1950 100 %     Weight --      Height --      Head Circumference --      Peak Flow --      Pain Score 10/18/22 1949 0     Pain Loc --      Pain Edu? --      Excl. in [place]? --    No data found.  Updated Vital  Signs BP 103/70 (BP Location: Left Arm)   Pulse 74   Temp 98 F (36.7 C) (Oral)   Resp 18   LMP 10/10/2022 (Exact Date)   SpO2 100%   Visual Acuity Right Eye Distance:   Left Eye Distance:   Bilateral Distance:    Right Eye Near:   Left Eye Near:    Bilateral Near:     Physical Exam Vitals and nursing note reviewed.  Constitutional:      Appearance: She is not ill-appearing or toxic-appearing.  HENT:     Head: Normocephalic and atraumatic.     Right Ear: Hearing and external ear normal.     Left Ear: Hearing and external ear normal.     Nose: Nose normal.     Mouth/Throat:     Lips: Pink.  Eyes:     General: Lids are normal. Vision grossly intact. Gaze aligned appropriately.     Extraocular Movements: Extraocular movements intact.     Conjunctiva/sclera: Conjunctivae normal.  Pulmonary:     Effort: Pulmonary effort is normal.  Genitourinary:    Comments: Deferred. Musculoskeletal:     Cervical back: Neck supple.  Skin:    General: Skin is warm and dry.     Capillary Refill: Capillary refill takes less than 2 seconds.     Findings: No rash.  Neurological:     General: No focal deficit present.     Mental Status: She is alert and oriented to person, place, and time. Mental status is at baseline.     Cranial Nerves: No dysarthria or facial asymmetry.  Psychiatric:        Mood and Affect: Mood normal.        Speech: Speech normal.        Behavior: Behavior normal.        Thought Content: Thought content normal.        Judgment: Judgment normal.      UC Treatments / Results  Labs (all labs ordered are listed, but only abnormal results are displayed) Labs Reviewed  CERVICOVAGINAL ANCILLARY ONLY - Abnormal; Notable for the following components:      Result Value   Candida Vaginitis Positive (*)    All other components within normal limits  EKG   Radiology No results found.  Procedures Procedures (including critical care time)  Medications Ordered  in UC Medications - No data to display  Initial Impression / Assessment and Plan / UC Course  I have reviewed the triage vital signs and the nursing notes.  Pertinent labs & imaging results that were available during my care of the patient were reviewed by me and considered in my medical decision making (see chart for details).   1.  Vaginal discharge Unclear etiology of patient's brown vaginal discharge, STD testing is pending and we will come back in the next 2 to 3 days.  Discharge is likely related to further shedding of the uterine lining.  She does not appear to be symptomatically anemic at time of visit and is nontoxic-appearing.  Vital signs are hemodynamically stable and therefore we will defer blood work.  Walking referral to OB/GYN provided and patient to call to schedule an appointment for further evaluation of irregular menstrual cycles and vaginal discharge.  Declines HIV and syphilis testing today.  Discussed physical exam and available lab work findings in clinic with patient.  Counseled patient regarding appropriate use of medications and potential side effects for all medications recommended or prescribed today. Discussed red flag signs and symptoms of worsening condition,when to call the PCP office, return to urgent care, and when to seek higher level of care in the emergency department. Patient verbalizes understanding and agreement with plan. All questions answered. Patient discharged in stable condition.     Final Clinical Impressions(s) / UC Diagnoses   Final diagnoses:  Vaginal discharge     Discharge Instructions      Your brown vaginal discharge may be related to further uterine bleeding and may be related to STD.   We sent of your STD testing again today and will come back in the next 2-3 days.   Follow-up with an OB/GYN for further evaluation of irregular menstrual cycles and bleeding.  Return to urgent care as needed.     ED Prescriptions   None     PDMP not reviewed this encounter.   Carlisle Beers, [place] 10/20/22 1149

## 2022-10-19 ENCOUNTER — Telehealth (HOSPITAL_COMMUNITY): Payer: Self-pay | Admitting: Emergency Medicine

## 2022-10-19 LAB — CERVICOVAGINAL ANCILLARY ONLY
Bacterial Vaginitis (gardnerella): NEGATIVE
Candida Glabrata: NEGATIVE
Candida Vaginitis: POSITIVE — AB
Chlamydia: NEGATIVE
Comment: NEGATIVE
Comment: NEGATIVE
Comment: NEGATIVE
Comment: NEGATIVE
Comment: NEGATIVE
Comment: NORMAL
Neisseria Gonorrhea: NEGATIVE
Trichomonas: NEGATIVE

## 2022-10-19 MED ORDER — FLUCONAZOLE 150 MG PO TABS: 150.0000 mg | ORAL_TABLET | Freq: Once | ORAL | 0 refills | Status: AC

## 2022-11-14 ENCOUNTER — Encounter (HOSPITAL_BASED_OUTPATIENT_CLINIC_OR_DEPARTMENT_OTHER): Payer: Self-pay | Admitting: Emergency Medicine

## 2022-11-14 ENCOUNTER — Emergency Department (HOSPITAL_BASED_OUTPATIENT_CLINIC_OR_DEPARTMENT_OTHER)
Admission: EM | Admit: 2022-11-14 | Discharge: 2022-11-15 | Discharge status: Against Medical Advice | Payer: BLUE CROSS/BLUE SHIELD | Attending: Emergency Medicine | Admitting: Emergency Medicine

## 2022-11-14 ENCOUNTER — Other Ambulatory Visit: Payer: Self-pay

## 2022-11-14 DIAGNOSIS — R102 Pelvic and perineal pain: Secondary | ICD-10-CM | POA: Diagnosis not present

## 2022-11-14 DIAGNOSIS — Z5321 Procedure and treatment not carried out due to patient leaving prior to being seen by health care provider: Secondary | ICD-10-CM | POA: Insufficient documentation

## 2022-11-14 LAB — URINALYSIS, ROUTINE W REFLEX MICROSCOPIC
Bilirubin Urine: NEGATIVE
Glucose, UA: NEGATIVE mg/dL
Hgb urine dipstick: NEGATIVE
Ketones, ur: NEGATIVE mg/dL
Leukocytes,Ua: NEGATIVE
Nitrite: NEGATIVE
Specific Gravity, Urine: 1.035 — ABNORMAL HIGH (ref 1.005–1.030)
pH: 6 (ref 5.0–8.0)

## 2022-11-14 LAB — PREGNANCY, URINE: Preg Test, Ur: NEGATIVE

## 2022-11-14 NOTE — ED Triage Notes (Signed)
"  Pelvic pain" Denies discharge, dysuria. Described as "its discomfort and it just dont feel normal" Started a few days ago

## 2022-11-15 ENCOUNTER — Encounter (HOSPITAL_COMMUNITY): Payer: Self-pay | Admitting: Emergency Medicine

## 2022-11-15 ENCOUNTER — Ambulatory Visit (HOSPITAL_COMMUNITY)
Admission: EM | Admit: 2022-11-15 | Discharge: 2022-11-15 | Disposition: A | Discharge status: Home / Self Care | Payer: BLUE CROSS/BLUE SHIELD | Attending: Internal Medicine | Admitting: Internal Medicine

## 2022-11-15 DIAGNOSIS — N760 Acute vaginitis: Secondary | ICD-10-CM | POA: Diagnosis present

## 2022-11-15 MED ORDER — FLUCONAZOLE 150 MG PO TABS: 150.0000 mg | ORAL_TABLET | Freq: Once | ORAL | 0 refills | Status: AC

## 2022-11-15 NOTE — Discharge Instructions (Addendum)
Please take medications as prescribed Will call you with recommendations if labs are abnormal please abstain from sexual intercourse until lab results are available If you have worsening symptoms please return to urgent care to be reevaluated.

## 2022-11-15 NOTE — ED Triage Notes (Signed)
Pt reports vaginal itching x 4 days. States she tried using monistat and symptoms weren't completely relieved.

## 2022-11-16 LAB — CERVICOVAGINAL ANCILLARY ONLY
Bacterial Vaginitis (gardnerella): NEGATIVE
Candida Glabrata: NEGATIVE
Candida Vaginitis: NEGATIVE
Chlamydia: NEGATIVE
Comment: NEGATIVE
Comment: NEGATIVE
Comment: NEGATIVE
Comment: NEGATIVE
Comment: NEGATIVE
Comment: NORMAL
Neisseria Gonorrhea: NEGATIVE
Trichomonas: NEGATIVE

## 2022-11-16 NOTE — ED Provider Notes (Signed)
[facility]    CSN: [id number] Arrival date & time: 11/15/22  1004      History   Chief Complaint Chief Complaint  Patient presents with   Vaginal Itching    HPI April Wallace is a 26 y.o. female comes to the urgent care with 4 days history of vaginal itching.  Patient says symptoms have been persistent in spite of using 1 dose of Monistat.  She denies pain on urination, urinary frequency or urgency.  No abdominal pain.  No nausea or vomiting.  Last menstrual period was about 2 weeks ago.  She is sexually active. HPI  Past Medical History:  Diagnosis Date   Anemia    Headache     Patient Active Problem List   Diagnosis Date Noted   Anemia affecting pregnancy in first trimester 11/12/2017   Bacterial vaginitis 10/30/2017   Candida vaginitis 10/30/2017   Abdominal cramping affecting pregnancy 10/30/2017   Morning sickness 10/16/2017    Past Surgical History:  Procedure Laterality Date   INDUCED ABORTION     WISDOM TOOTH EXTRACTION      OB History     Gravida  3   Para      Term      Preterm      AB  2   Living         SAB  1   IAB      Ectopic      Multiple      Live Births               Home Medications    Prior to Admission medications   Medication Sig Start Date End Date Taking? Authorizing Provider  acetaminophen (TYLENOL) 500 MG tablet Take 2 tablets (1,000 mg total) by mouth every 6 (six) hours as needed. 07/21/22   Carlisle Beers, FNP  Clindamycin Phos-Benzoyl Perox gel Apply to the affected areas twice daily. 05/06/22   Leath-Warren, Sadie Haber, NP  clindamycin-benzoyl peroxide (BENZACLIN) gel Apply topically 2 (two) times daily. 04/24/22   Waldon Merl, PA-C  ferrous sulfate 325 (65 FE) MG tablet Take 1 tablet (325 mg total) by mouth daily with breakfast for 14 days. 12/06/18 12/20/18  Shaune Pollack, MD  Guaifenesin 1200 MG TB12 Take 1 tablet (1,200 mg total) by mouth in the morning and at bedtime.  07/21/22   Carlisle Beers, FNP  ibuprofen (ADVIL) 600 MG tablet Take 1 tablet (600 mg total) by mouth every 6 (six) hours as needed. 07/21/22   Carlisle Beers, FNP  phenazopyridine (PYRIDIUM) 200 MG tablet Take 1 tablet (200 mg total) by mouth 3 (three) times daily as needed for pain. 03/17/21   Domenick Gong, MD    Family History Family History  Problem Relation Age of Onset   Diabetes Maternal Grandmother    Seizures Maternal Grandmother    Asthma Maternal Grandmother    Cancer Maternal Grandfather    Healthy Mother    Healthy Father     Social History Social History   Tobacco Use   Smoking status: Never   Smokeless tobacco: Never  Vaping Use   Vaping Use: Never used  Substance Use Topics   Alcohol use: Yes    Comment: occ   Drug use: No     Allergies   Patient has no known allergies.   Review of Systems Review of Systems  Respiratory: Negative.    Genitourinary: Negative.  Negative for vaginal bleeding, vaginal discharge and  vaginal pain.  Musculoskeletal: Negative.   Neurological: Negative.      Physical Exam Triage Vital Signs ED Triage Vitals  Enc Vitals Group     BP 11/15/22 1119 101/71     Pulse Rate 11/15/22 1119 79     Resp 11/15/22 1119 18     Temp 11/15/22 1119 98.8 F (37.1 C)     Temp Source 11/15/22 1119 Oral     SpO2 11/15/22 1119 99 %     Weight --      Height --      Head Circumference --      Peak Flow --      Pain Score 11/15/22 1118 0     Pain Loc --      Pain Edu? --      Excl. in [place]? --    No data found.  Updated Vital Signs BP 101/71 (BP Location: Right Arm)   Pulse 79   Temp 98.8 F (37.1 C) (Oral)   Resp 18   LMP 10/28/2022 (Approximate)   SpO2 99%   Visual Acuity Right Eye Distance:   Left Eye Distance:   Bilateral Distance:    Right Eye Near:   Left Eye Near:    Bilateral Near:     Physical Exam Vitals and nursing note reviewed.  Constitutional:      General: She is not in acute distress.     Appearance: She is not ill-appearing.  Cardiovascular:     Rate and Rhythm: Normal rate and regular rhythm.  Pulmonary:     Effort: Pulmonary effort is normal.     Breath sounds: Normal breath sounds.  Abdominal:     General: Bowel sounds are normal. There is no distension.     Tenderness: There is no abdominal tenderness. There is no guarding.  Musculoskeletal:        General: Normal range of motion.  Neurological:     Mental Status: She is alert.      UC Treatments / Results  Labs (all labs ordered are listed, but only abnormal results are displayed) Labs Reviewed  CERVICOVAGINAL ANCILLARY ONLY    EKG   Radiology No results found.  Procedures Procedures (including critical care time)  Medications Ordered in UC Medications - No data to display  Initial Impression / Assessment and Plan / UC Course  I have reviewed the triage vital signs and the nursing notes.  Pertinent labs & imaging results that were available during my care of the patient were reviewed by me and considered in my medical decision making (see chart for details).     1.  Acute vaginitis likely vaginal yeast infection: Cervical vaginal swab for GC/chlamydia/trichomonas/bacterial vaginosis/vaginal yeast Fluconazole 150 mg x 1 dose to be repeated in 3 days if there is no improvement in symptoms Will call patient with recommendations if labs are abnormal Return precautions given Patient is advised to abstain from sexual intercourse until lab results are available. Final Clinical Impressions(s) / UC Diagnoses   Final diagnoses:  Acute vaginitis     Discharge Instructions      Please take medications as prescribed Will call you with recommendations if labs are abnormal please abstain from sexual intercourse until lab results are available If you have worsening symptoms please return to urgent care to be reevaluated.   ED Prescriptions     Medication Sig Dispense Auth. Provider    fluconazole (DIFLUCAN) 150 MG tablet Take 1 tablet (150 mg total) by mouth once  for 1 dose. Please take second dose if there is no improvement 3 days after the first dose. 2 tablet Dorinne Graeff, Britta Mccreedy, MD      PDMP not reviewed this encounter.   Merrilee Jansky, MD 11/16/22 1003

## 2022-11-18 ENCOUNTER — Encounter (HOSPITAL_BASED_OUTPATIENT_CLINIC_OR_DEPARTMENT_OTHER): Payer: Self-pay | Admitting: Emergency Medicine

## 2022-11-18 ENCOUNTER — Other Ambulatory Visit: Payer: Self-pay

## 2022-11-18 ENCOUNTER — Emergency Department (HOSPITAL_BASED_OUTPATIENT_CLINIC_OR_DEPARTMENT_OTHER)
Admission: EM | Admit: 2022-11-18 | Discharge: 2022-11-18 | Disposition: A | Discharge status: Home / Self Care | Payer: BLUE CROSS/BLUE SHIELD | Attending: Emergency Medicine | Admitting: Emergency Medicine

## 2022-11-18 DIAGNOSIS — Z202 Contact with and (suspected) exposure to infections with a predominantly sexual mode of transmission: Secondary | ICD-10-CM | POA: Diagnosis present

## 2022-11-18 DIAGNOSIS — Z113 Encounter for screening for infections with a predominantly sexual mode of transmission: Secondary | ICD-10-CM

## 2022-11-18 LAB — HIV ANTIBODY (ROUTINE TESTING W REFLEX): HIV Screen 4th Generation wRfx: NONREACTIVE

## 2022-11-18 NOTE — ED Notes (Signed)
2 gold top lab tubes submitted to lab.

## 2022-11-18 NOTE — Discharge Instructions (Addendum)
Please follow-up with the health department in your area.

## 2022-11-18 NOTE — ED Triage Notes (Signed)
Pt arrives pov, requesting blood labs d/t concern STD's Denies symptoms

## 2022-11-18 NOTE — ED Provider Notes (Signed)
[facility] Provider Note   CSN: [id number] Arrival date & time: 11/18/22  1308     History  Chief Complaint  Patient presents with   Exposure to STD    Vali Cuba Hagglund is a 26 y.o. female.   Exposure to STD  Patient is a 26 year old female present emergency room today requesting labs for STDs.  Not having any symptoms currently.  She states that she shaves her pubic region and states that she had some bumps afterwards.  She states that her partner also has some bumps and she was concerned that she might have herpes.  She has no history of herpes that she knows of.    Patient denies any vaginal discharge vaginal pain no urinary frequency urgency dysuria hematuria.  No other associate symptoms.    Home Medications Prior to Admission medications   Medication Sig Start Date End Date Taking? Authorizing Provider  acetaminophen (TYLENOL) 500 MG tablet Take 2 tablets (1,000 mg total) by mouth every 6 (six) hours as needed. 07/21/22   Talbot Grumbling, FNP  Clindamycin Phos-Benzoyl Perox gel Apply to the affected areas twice daily. 05/06/22   Leath-Warren, Alda Lea, NP  clindamycin-benzoyl peroxide (BENZACLIN) gel Apply topically 2 (two) times daily. 04/24/22   Brunetta Jeans, PA-C  ferrous sulfate 325 (65 FE) MG tablet Take 1 tablet (325 mg total) by mouth daily with breakfast for 14 days. 12/06/18 12/20/18  Duffy Bruce, MD  Guaifenesin 1200 MG TB12 Take 1 tablet (1,200 mg total) by mouth in the morning and at bedtime. 07/21/22   Talbot Grumbling, FNP  ibuprofen (ADVIL) 600 MG tablet Take 1 tablet (600 mg total) by mouth every 6 (six) hours as needed. 07/21/22   Talbot Grumbling, FNP  phenazopyridine (PYRIDIUM) 200 MG tablet Take 1 tablet (200 mg total) by mouth 3 (three) times daily as needed for pain. 03/17/21   Melynda Ripple, MD      Allergies    Patient has no known allergies.    Review of Systems   Review of  Systems  Physical Exam Updated Vital Signs BP 95/61   Pulse 73   Temp 98.3 F (36.8 C) (Oral)   Resp 18   LMP 10/28/2022 (Approximate)   SpO2 100%  Physical Exam Vitals and nursing note reviewed.  Constitutional:      General: She is not in acute distress.    Appearance: Normal appearance. She is not ill-appearing.  HENT:     Head: Normocephalic and atraumatic.  Eyes:     General: No scleral icterus.       Right eye: No discharge.        Left eye: No discharge.     Conjunctiva/sclera: Conjunctivae normal.  Pulmonary:     Effort: Pulmonary effort is normal.     Breath sounds: No stridor.  Genitourinary:    Comments: Deferred by pt Neurological:     Mental Status: She is alert and oriented to person, place, and time. Mental status is at baseline.     ED Results / Procedures / Treatments   Labs (all labs ordered are listed, but only abnormal results are displayed) Labs Reviewed  HIV ANTIBODY (ROUTINE TESTING W REFLEX)  RPR    EKG None  Radiology No results found.  Procedures Procedures    Medications Ordered in ED Medications - No data to display  ED Course/ Medical Decision Making/ A&P  Medical Decision Making   Patient is a 26 year old female present emergency room today requesting labs for STDs.  Not having any symptoms currently.  She states that she shaves her pubic region and states that she had some bumps afterwards.  She states that her partner also has some bumps and she was concerned that she might have herpes.  She has no history of herpes that she knows of.    Patient denies any vaginal discharge vaginal pain no urinary frequency urgency dysuria hematuria.  No other associate symptoms.  We did share decision-making discussion about neck steps.  She declines pelvic exam.  I recommend she follow-up with OB/GYN.  Also provided her with the information for health department and was in [place].  Return precautions  discussed  It seems HIV/RPR were drawn in triage -- these results will be available on mychart.    Final Clinical Impression(s) / ED Diagnoses Final diagnoses:  Screen for STD (sexually transmitted disease)    Rx / DC Orders ED Discharge Orders     None         Gailen Shelter, [place] 11/18/22 1848    Melene Plan, DO 11/18/22 1849

## 2022-11-18 NOTE — ED Notes (Signed)
D/c paperwork reviewed with pt, including f/u care. No questions or concerns at time of d/c. Ambulatory to ED exit without assistance, NAD>  

## 2022-11-18 NOTE — ED Notes (Signed)
Pt advised she wants to test for HIV. Just had a UA and pelvic swab at Urgent Care to confirm no other STD's.

## 2022-11-19 LAB — RPR: RPR Ser Ql: NONREACTIVE

## 2022-12-21 ENCOUNTER — Ambulatory Visit (HOSPITAL_COMMUNITY)
Admission: EM | Admit: 2022-12-21 | Discharge: 2022-12-21 | Disposition: A | Discharge status: Home / Self Care | Payer: BLUE CROSS/BLUE SHIELD | Attending: Family Medicine | Admitting: Family Medicine

## 2022-12-21 ENCOUNTER — Encounter (HOSPITAL_COMMUNITY): Payer: Self-pay | Admitting: *Deleted

## 2022-12-21 DIAGNOSIS — S61216D Laceration without foreign body of right little finger without damage to nail, subsequent encounter: Secondary | ICD-10-CM | POA: Diagnosis not present

## 2022-12-21 DIAGNOSIS — Z4802 Encounter for removal of sutures: Secondary | ICD-10-CM | POA: Diagnosis not present

## 2022-12-21 NOTE — ED Triage Notes (Signed)
Pt had stitches places at [facility] on 12/11/2022, on her right pinky (5 places per ER note)

## 2022-12-21 NOTE — ED Provider Notes (Addendum)
[place]    CSN: [id number] Arrival date & time: 12/21/22  1650      History   Chief Complaint Chief Complaint  Patient presents with   Suture / Staple Removal    HPI April Wallace is a 27 y.o. female.    Suture / Staple Removal   Here for stitches to be removed.  She was seen in another facility for a laceration to her right pinky finger.  She had 6 stitches placed.  It is healed well.  She is still got a little numbness on the ulnar side of her finger.  Past Medical History:  Diagnosis Date   Anemia    Headache     Patient Active Problem List   Diagnosis Date Noted   Anemia affecting pregnancy in first trimester 11/12/2017   Bacterial vaginitis 10/30/2017   Candida vaginitis 10/30/2017   Abdominal cramping affecting pregnancy 10/30/2017   Morning sickness 10/16/2017    Past Surgical History:  Procedure Laterality Date   INDUCED ABORTION     WISDOM TOOTH EXTRACTION      OB History     Gravida  3   Para      Term      Preterm      AB  2   Living         SAB  1   IAB      Ectopic      Multiple      Live Births               Home Medications    Prior to Admission medications   Medication Sig Start Date End Date Taking? Authorizing Provider  acetaminophen (TYLENOL) 500 MG tablet Take 2 tablets (1,000 mg total) by mouth every 6 (six) hours as needed. 07/21/22   Talbot Grumbling, FNP  Clindamycin Phos-Benzoyl Perox gel Apply to the affected areas twice daily. 05/06/22   Leath-Warren, Alda Lea, NP  clindamycin-benzoyl peroxide (BENZACLIN) gel Apply topically 2 (two) times daily. 04/24/22   Brunetta Jeans, PA-C  ferrous sulfate 325 (65 FE) MG tablet Take 1 tablet (325 mg total) by mouth daily with breakfast for 14 days. 12/06/18 12/20/18  Duffy Bruce, MD  Guaifenesin 1200 MG TB12 Take 1 tablet (1,200 mg total) by mouth in the morning and at bedtime. 07/21/22   Talbot Grumbling, FNP  ibuprofen (ADVIL) 600 MG  tablet Take 1 tablet (600 mg total) by mouth every 6 (six) hours as needed. 07/21/22   Talbot Grumbling, FNP  phenazopyridine (PYRIDIUM) 200 MG tablet Take 1 tablet (200 mg total) by mouth 3 (three) times daily as needed for pain. 03/17/21   Melynda Ripple, MD    Family History Family History  Problem Relation Age of Onset   Diabetes Maternal Grandmother    Seizures Maternal Grandmother    Asthma Maternal Grandmother    Cancer Maternal Grandfather    Healthy Mother    Healthy Father     Social History Social History   Tobacco Use   Smoking status: Never   Smokeless tobacco: Never  Vaping Use   Vaping Use: Never used  Substance Use Topics   Alcohol use: Yes    Comment: occ   Drug use: No     Allergies   Patient has no known allergies.   Review of Systems Review of Systems   Physical Exam Triage Vital Signs ED Triage Vitals  Enc Vitals Group     BP  12/21/22 1822 111/73     Pulse Rate 12/21/22 1822 84     Resp 12/21/22 1822 18     Temp 12/21/22 1822 98.8 F (37.1 C)     Temp Source 12/21/22 1822 Oral     SpO2 12/21/22 1822 98 %     Weight --      Height --      Head Circumference --      Peak Flow --      Pain Score 12/21/22 1819 0     Pain Loc --      Pain Edu? --      Excl. in [place]? --    No data found.  Updated Vital Signs BP 111/73 (BP Location: Right Arm)   Pulse 84   Temp 98.8 F (37.1 C) (Oral)   Resp 18   LMP 12/19/2022 (Exact Date)   SpO2 98%   Visual Acuity Right Eye Distance:   Left Eye Distance:   Bilateral Distance:    Right Eye Near:   Left Eye Near:    Bilateral Near:     Physical Exam Vitals reviewed.  Constitutional:      General: She is not in acute distress.    Appearance: She is not ill-appearing, toxic-appearing or diaphoretic.  Skin:    Coloration: Skin is not jaundiced or pale.     Comments: The laceration is well-healed there is no erythema or drainage.  All 6 sutures are removed.  Bandages applied since it  wanted to ooze a little blood just a little bit from one of the sutures being removed.  Neurological:     Mental Status: She is alert and oriented to person, place, and time.  Psychiatric:        Behavior: Behavior normal.      UC Treatments / Results  Labs (all labs ordered are listed, but only abnormal results are displayed) Labs Reviewed - No data to display  EKG   Radiology No results found.  Procedures Procedures (including critical care time)  Medications Ordered in UC Medications - No data to display  Initial Impression / Assessment and Plan / UC Course  I have reviewed the triage vital signs and the nursing notes.  Pertinent labs & imaging results that were available during my care of the patient were reviewed by me and considered in my medical decision making (see chart for details).           She will continue wound care she has been doing. Final Clinical Impressions(s) / UC Diagnoses   Final diagnoses:  None   Discharge Instructions   None    ED Prescriptions   None    PDMP not reviewed this encounter.   Barrett Henle, MD 12/21/22 [name]    Barrett Henle, MD 12/21/22 [phone]

## 2022-12-21 NOTE — Discharge Instructions (Signed)
I took  6 stitches out of your hand  Continue cleaning and caring for your wound as you have been doing until its healed

## 2022-12-23 ENCOUNTER — Ambulatory Visit (HOSPITAL_COMMUNITY)
Admission: EM | Admit: 2022-12-23 | Discharge: 2022-12-23 | Disposition: A | Discharge status: Home / Self Care | Payer: BLUE CROSS/BLUE SHIELD | Attending: Internal Medicine | Admitting: Internal Medicine

## 2022-12-23 ENCOUNTER — Encounter (HOSPITAL_COMMUNITY): Payer: Self-pay

## 2022-12-23 DIAGNOSIS — S61217D Laceration without foreign body of left little finger without damage to nail, subsequent encounter: Secondary | ICD-10-CM

## 2022-12-23 DIAGNOSIS — S61218D Laceration without foreign body of other finger without damage to nail, subsequent encounter: Secondary | ICD-10-CM

## 2022-12-23 MED ORDER — MUPIROCIN 2 % EX OINT: 1.0000 | TOPICAL_OINTMENT | Freq: Two times a day (BID) | CUTANEOUS | 0 refills | Status: DC

## 2022-12-23 NOTE — ED Provider Notes (Signed)
[facility]    CSN: [id number] Arrival date & time: 12/23/22  1609      History   Chief Complaint Chief Complaint  Patient presents with   Laceration    HPI April Wallace is a 27 y.o. female.   Patient presents urgent care for reevaluation of wound related to laceration of the pad of the left pinky finger between the PIP and DIP that initially happened on December 11, 2022.  Initial injury happened when a piece of plastic on her dog's leash cut her finger.  Laceration was repaired by [facility] and sutures were removed 2 days ago.  At visit 2 days ago, provider examined wound and felt wound to be healing appropriately without signs of infection.  Patient continues to experience minimal pain to the area but became concerned yesterday when she noticed a small amount of what she believes to be pus coming from the wound bed and the appearance of wound opening.  Denies disrupting wound bed healing by picking at the wound or reinjury.  She has not had any recent antibiotics and denies redness, swelling, and significant pain to the surrounding soft tissues of the laceration.  She has not been applying any ointments, powders, soaps, or lotions to the laceration and has been keeping it covered with a Band-Aid to prevent infection.  She is concerned that the wound may be infected due to possible purulent drainage yesterday.     Past Medical History:  Diagnosis Date   Anemia    Headache     Patient Active Problem List   Diagnosis Date Noted   Anemia affecting pregnancy in first trimester 11/12/2017   Bacterial vaginitis 10/30/2017   Candida vaginitis 10/30/2017   Abdominal cramping affecting pregnancy 10/30/2017   Morning sickness 10/16/2017    Past Surgical History:  Procedure Laterality Date   INDUCED ABORTION     WISDOM TOOTH EXTRACTION      OB History     Gravida  3   Para      Term      Preterm      AB  2   Living         SAB  1   IAB       Ectopic      Multiple      Live Births               Home Medications    Prior to Admission medications   Medication Sig Start Date End Date Taking? Authorizing Provider  mupirocin ointment (BACTROBAN) 2 % Apply 1 Application topically 2 (two) times daily. 12/23/22  Yes Carlisle Beers, FNP  acetaminophen (TYLENOL) 500 MG tablet Take 2 tablets (1,000 mg total) by mouth every 6 (six) hours as needed. 07/21/22   Carlisle Beers, FNP  Clindamycin Phos-Benzoyl Perox gel Apply to the affected areas twice daily. 05/06/22   Leath-Warren, Sadie Haber, NP  clindamycin-benzoyl peroxide (BENZACLIN) gel Apply topically 2 (two) times daily. 04/24/22   Waldon Merl, PA-C  ferrous sulfate 325 (65 FE) MG tablet Take 1 tablet (325 mg total) by mouth daily with breakfast for 14 days. 12/06/18 12/20/18  Shaune Pollack, MD  Guaifenesin 1200 MG TB12 Take 1 tablet (1,200 mg total) by mouth in the morning and at bedtime. 07/21/22   Carlisle Beers, FNP    Family History Family History  Problem Relation Age of Onset   Diabetes Maternal Grandmother    Seizures Maternal  Grandmother    Asthma Maternal Grandmother    Cancer Maternal Grandfather    Healthy Mother    Healthy Father     Social History Social History   Tobacco Use   Smoking status: Never   Smokeless tobacco: Never  Vaping Use   Vaping Use: Never used  Substance Use Topics   Alcohol use: Yes    Comment: occ   Drug use: No     Allergies   Patient has no known allergies.   Review of Systems Review of Systems Per HPI  Physical Exam Triage Vital Signs ED Triage Vitals [12/23/22 1731]  Enc Vitals Group     BP 110/67     Pulse Rate 75     Resp 18     Temp 98.2 F (36.8 C)     Temp Source Oral     SpO2 98 %     Weight      Height      Head Circumference      Peak Flow      Pain Score      Pain Loc      Pain Edu?      Excl. in [place]?    No data found.  Updated Vital Signs BP 110/67 (BP  Location: Left Arm)   Pulse 75   Temp 98.2 F (36.8 C) (Oral)   Resp 18   LMP 12/19/2022 (Exact Date)   SpO2 98%   Visual Acuity Right Eye Distance:   Left Eye Distance:   Bilateral Distance:    Right Eye Near:   Left Eye Near:    Bilateral Near:     Physical Exam Vitals and nursing note reviewed.  Constitutional:      Appearance: She is not ill-appearing or toxic-appearing.  HENT:     Head: Normocephalic and atraumatic.     Right Ear: Hearing and external ear normal.     Left Ear: Hearing and external ear normal.     Nose: Nose normal.     Mouth/Throat:     Lips: Pink.  Eyes:     General: Lids are normal. Vision grossly intact. Gaze aligned appropriately.     Extraocular Movements: Extraocular movements intact.     Conjunctiva/sclera: Conjunctivae normal.  Pulmonary:     Effort: Pulmonary effort is normal.  Musculoskeletal:     Cervical back: Neck supple.  Skin:    General: Skin is warm and dry.     Capillary Refill: Capillary refill takes less than 2 seconds.     Findings: Wound present. No rash.     Comments: Laceration to the pad of the left pinky finger in between the PIP and DIP joints appears to be well-healing and without evidence of infection.  No warmth, erythema, or underlying soft tissue swelling to the surrounding area of the laceration.  Patient has full range of motion of the left pinky finger.  Strength and sensation intact distally to injury to affected digit.  Capillary refill is less than 3.  +2 left radial pulse.  See image below for further detail.  Neurological:     General: No focal deficit present.     Mental Status: She is alert and oriented to person, place, and time. Mental status is at baseline.     Cranial Nerves: No dysarthria or facial asymmetry.  Psychiatric:        Mood and Affect: Mood normal.        Speech: Speech normal.  Behavior: Behavior normal.        Thought Content: Thought content normal.        Judgment: Judgment  normal.      UC Treatments / Results  Labs (all labs ordered are listed, but only abnormal results are displayed) Labs Reviewed - No data to display  EKG   Radiology No results found.  Procedures Procedures (including critical care time)  Medications Ordered in UC Medications - No data to display  Initial Impression / Assessment and Plan / UC Course  I have reviewed the triage vital signs and the nursing notes.  Pertinent labs & imaging results that were available during my care of the patient were reviewed by me and considered in my medical decision making (see chart for details).   1.  Laceration of little finger without foreign body without damage to the nail, subsequent encounter Patient concerned the wound may be infected due to seeing purulent drainage from the wound last night.  There is no purulent drainage to the area today and the wound bed appears to be healing well.  Due to patient's concern, will send in mupirocin to be applied to the wound twice daily for the next 5 to 7 days to provide antibiotic material coverage and moisture to the wound bed to promote appropriate wound healing.  She is to place a small amount of mupirocin to the wound twice daily and change the dressing twice daily for the next 5 to 7 days.  Advised to keep the wound covered to prevent infection.  She may rinse the laceration gently with warm soap and water and antibacterial soap.  Dicussed strict return precautions for any new or worsening signs of infection.   Discussed physical exam and available lab work findings in clinic with patient.  Counseled patient regarding appropriate use of medications and potential side effects for all medications recommended or prescribed today. Discussed red flag signs and symptoms of worsening condition,when to call the PCP office, return to urgent care, and when to seek higher level of care in the emergency department. Patient verbalizes understanding and agreement  with plan. All questions answered. Patient discharged in stable condition.    Final Clinical Impressions(s) / UC Diagnoses   Final diagnoses:  Laceration of little finger without foreign body without damage to nail, unspecified laterality, subsequent encounter     Discharge Instructions      Apply mupirocin ointment to the laceration twice daily for the next 5 to 7 days.  Keep it covered.   Rinse the laceration with antibacterial soap.  Do not scrub or pick at the site.  The laceration will continue to heal from the inside out.   If you develop any new or worsening symptoms or do not improve in the next 2 to 3 days, please return.  If your symptoms are severe, please go to the emergency room.  Follow-up with your primary care provider for further evaluation and management of your symptoms as well as ongoing wellness visits.  I hope you feel better!    ED Prescriptions     Medication Sig Dispense Auth. Provider   mupirocin ointment (BACTROBAN) 2 % Apply 1 Application topically 2 (two) times daily. 22 g Carlisle Beers, FNP      PDMP not reviewed this encounter.   Carlisle Beers, [place] 12/23/22 2244

## 2022-12-23 NOTE — Discharge Instructions (Signed)
Apply mupirocin ointment to the laceration twice daily for the next 5 to 7 days.  Keep it covered.   Rinse the laceration with antibacterial soap.  Do not scrub or pick at the site.  The laceration will continue to heal from the inside out.   If you develop any new or worsening symptoms or do not improve in the next 2 to 3 days, please return.  If your symptoms are severe, please go to the emergency room.  Follow-up with your primary care provider for further evaluation and management of your symptoms as well as ongoing wellness visits.  I hope you feel better!

## 2022-12-23 NOTE — ED Triage Notes (Signed)
Pt presents to office for a follow-up on her left pinky finger laceration. Pt stated the wound is not healing well.

## 2023-01-21 ENCOUNTER — Ambulatory Visit (HOSPITAL_COMMUNITY)
Admission: RE | Admit: 2023-01-21 | Discharge: 2023-01-21 | Disposition: A | Discharge status: Home / Self Care | Payer: BLUE CROSS/BLUE SHIELD | Source: Ambulatory Visit | Attending: Family Medicine | Admitting: Family Medicine

## 2023-01-21 ENCOUNTER — Encounter (HOSPITAL_COMMUNITY): Payer: Self-pay

## 2023-01-21 VITALS — BP 115/74 | HR 76 | Temp 98.8°F | Resp 18 | Wt 120.0 lb

## 2023-01-21 DIAGNOSIS — Z202 Contact with and (suspected) exposure to infections with a predominantly sexual mode of transmission: Secondary | ICD-10-CM

## 2023-01-21 DIAGNOSIS — Z113 Encounter for screening for infections with a predominantly sexual mode of transmission: Secondary | ICD-10-CM

## 2023-01-21 LAB — POC URINE PREG, ED: Preg Test, Ur: NEGATIVE

## 2023-01-21 NOTE — ED Provider Notes (Signed)
[place]    CSN: [id number] Arrival date & time: 01/21/23  1921      History   Chief Complaint Chief Complaint  Patient presents with   SEXUALLY TRANSMITTED DISEASE    Std testing - Entered by patient    HPI April Wallace is a 27 y.o. female.   HPI Here for STD testing.  She has had no fever or vaginal discharge.  No abdominal pain.  No dysuria.  She did have a little vaginal itching and used Monistat.  She had HIV and RPR testing in December 2023  Periods are irregular as she is on Depo-Provera and she is not sure when the last one was.  Past Medical History:  Diagnosis Date   Anemia    Headache     Patient Active Problem List   Diagnosis Date Noted   Anemia affecting pregnancy in first trimester 11/12/2017   Bacterial vaginitis 10/30/2017   Candida vaginitis 10/30/2017   Abdominal cramping affecting pregnancy 10/30/2017   Morning sickness 10/16/2017    Past Surgical History:  Procedure Laterality Date   INDUCED ABORTION     WISDOM TOOTH EXTRACTION      OB History     Gravida  3   Para      Term      Preterm      AB  2   Living         SAB  1   IAB      Ectopic      Multiple      Live Births               Home Medications    Prior to Admission medications   Medication Sig Start Date End Date Taking? Authorizing Provider  acetaminophen (TYLENOL) 500 MG tablet Take 2 tablets (1,000 mg total) by mouth every 6 (six) hours as needed. 07/21/22   Talbot Grumbling, FNP  Clindamycin Phos-Benzoyl Perox gel Apply to the affected areas twice daily. 05/06/22   Leath-Warren, Alda Lea, NP  clindamycin-benzoyl peroxide (BENZACLIN) gel Apply topically 2 (two) times daily. 04/24/22   Brunetta Jeans, PA-C  ferrous sulfate 325 (65 FE) MG tablet Take 1 tablet (325 mg total) by mouth daily with breakfast for 14 days. 12/06/18 12/20/18  Duffy Bruce, MD  Guaifenesin 1200 MG TB12 Take 1 tablet (1,200 mg total) by mouth in  the morning and at bedtime. 07/21/22   Talbot Grumbling, FNP  mupirocin ointment (BACTROBAN) 2 % Apply 1 Application topically 2 (two) times daily. 12/23/22   Talbot Grumbling, FNP    Family History Family History  Problem Relation Age of Onset   Diabetes Maternal Grandmother    Seizures Maternal Grandmother    Asthma Maternal Grandmother    Cancer Maternal Grandfather    Healthy Mother    Healthy Father     Social History Social History   Tobacco Use   Smoking status: Never   Smokeless tobacco: Never  Vaping Use   Vaping Use: Never used  Substance Use Topics   Alcohol use: Yes    Comment: occ   Drug use: No     Allergies   Patient has no known allergies.   Review of Systems Review of Systems   Physical Exam Triage Vital Signs ED Triage Vitals  Enc Vitals Group     BP 01/21/23 1943 115/74     Pulse Rate 01/21/23 1943 76     Resp 01/21/23 1943  18     Temp 01/21/23 1943 98.8 F (37.1 C)     Temp Source 01/21/23 1943 Oral     SpO2 01/21/23 1943 99 %     Weight 01/21/23 1941 120 lb (54.4 kg)     Height --      Head Circumference --      Peak Flow --      Pain Score 01/21/23 1941 0     Pain Loc --      Pain Edu? --      Excl. in [place]? --    No data found.  Updated Vital Signs BP 115/74 (BP Location: Left Arm)   Pulse 76   Temp 98.8 F (37.1 C) (Oral)   Resp 18   Wt 54.4 kg   LMP 01/03/2023 (Approximate)   SpO2 99%   BMI 20.60 kg/m   Visual Acuity Right Eye Distance:   Left Eye Distance:   Bilateral Distance:    Right Eye Near:   Left Eye Near:    Bilateral Near:     Physical Exam Vitals reviewed.  Constitutional:      General: She is not in acute distress.    Appearance: She is not ill-appearing, toxic-appearing or diaphoretic.  Skin:    Coloration: Skin is not jaundiced or pale.  Neurological:     Mental Status: She is alert and oriented to person, place, and time.  Psychiatric:        Behavior: Behavior normal.      UC  Treatments / Results  Labs (all labs ordered are listed, but only abnormal results are displayed) Labs Reviewed  POC URINE PREG, ED  CERVICOVAGINAL ANCILLARY ONLY    EKG   Radiology No results found.  Procedures Procedures (including critical care time)  Medications Ordered in UC Medications - No data to display  Initial Impression / Assessment and Plan / UC Course  I have reviewed the triage vital signs and the nursing notes.  Pertinent labs & imaging results that were available during my care of the patient were reviewed by me and considered in my medical decision making (see chart for details).       UPT is negative. Self swab is done, and staff will notify her of any positives and treat per protocol Final Clinical Impressions(s) / UC Diagnoses   Final diagnoses:  Screening examination for STD (sexually transmitted disease)     Discharge Instructions      The pregnancy test was negative  Staff will notify you of any positives on the swab     ED Prescriptions   None    PDMP not reviewed this encounter.   Barrett Henle, MD 01/21/23 2014

## 2023-01-21 NOTE — Discharge Instructions (Addendum)
The pregnancy test was negative  Staff will notify you of any positives on the swab

## 2023-01-21 NOTE — ED Triage Notes (Signed)
Pt states that she is here for STD testing. Routine. Thinks she may have a yeast infection.

## 2023-01-22 LAB — CERVICOVAGINAL ANCILLARY ONLY
Bacterial Vaginitis (gardnerella): NEGATIVE
Candida Glabrata: NEGATIVE
Candida Vaginitis: NEGATIVE
Chlamydia: NEGATIVE
Comment: NEGATIVE
Comment: NEGATIVE
Comment: NEGATIVE
Comment: NEGATIVE
Comment: NEGATIVE
Comment: NORMAL
Neisseria Gonorrhea: NEGATIVE
Trichomonas: NEGATIVE

## 2023-03-14 ENCOUNTER — Ambulatory Visit
Admission: EM | Admit: 2023-03-14 | Discharge: 2023-03-14 | Disposition: A | Discharge status: Home / Self Care | Payer: Medicaid Other | Attending: Internal Medicine | Admitting: Internal Medicine

## 2023-03-14 ENCOUNTER — Ambulatory Visit (INDEPENDENT_AMBULATORY_CARE_PROVIDER_SITE_OTHER): Payer: Medicaid Other

## 2023-03-14 ENCOUNTER — Other Ambulatory Visit: Payer: Medicaid Other

## 2023-03-14 DIAGNOSIS — M545 Low back pain, unspecified: Secondary | ICD-10-CM | POA: Diagnosis not present

## 2023-03-14 MED ORDER — IBUPROFEN 800 MG PO TABS: 800.0000 mg | ORAL_TABLET | Freq: Three times a day (TID) | ORAL | 0 refills | Status: DC | PRN

## 2023-03-14 MED ORDER — CYCLOBENZAPRINE HCL 5 MG PO TABS: 5.0000 mg | ORAL_TABLET | Freq: Three times a day (TID) | ORAL | 0 refills | Status: DC | PRN

## 2023-03-14 MED ORDER — METHYLPREDNISOLONE ACETATE 80 MG/ML IJ SUSP
40.0000 mg | Freq: Once | INTRAMUSCULAR | Status: AC
  Administered 2023-03-14: 40 mg via INTRAMUSCULAR

## 2023-03-14 MED ORDER — METHYLPREDNISOLONE SODIUM SUCC 125 MG IJ SOLR: 40.0000 mg | Freq: Once | INTRAMUSCULAR | Status: DC

## 2023-03-14 MED ORDER — KETOROLAC TROMETHAMINE 30 MG/ML IJ SOLN
30.0000 mg | Freq: Once | INTRAMUSCULAR | Status: AC
  Administered 2023-03-14: 30 mg via INTRAMUSCULAR
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
## 2023-03-14 NOTE — Discharge Instructions (Addendum)
Your x-rays showed no broken bones or dislocations. They were sent to a radiologist for further evaluation and we will call you if the radiologist sees anything different.   You were also given a prescription for a muscle relaxer called Flexeril. Take this at bedtime when you are not driving or working because it may cause dizziness and/or drowsiness.  You have been prescribed Ibuprofen 800mg  for your pain as well. This is an antiinflammatory often used to treat pain. Please take as directed and do not take with any additional Ibuprofen, Motrin, Advil, or Aleve.   You were given two injections (Toradol and Methylprednisolone) for your back pain today. This will help with the pain and inflammation. Please do not take any more ibuprofen tonight due to the fact you were given these injections today.  Return in 2 to 3 days if no improvement. Your evaluation was not suggestive of any emergent condition requiring medical intervention at this time. However, our office is limited in the tests and imaging we can perform at our facility.  Therefore, it is very important for you to pay attention to any new symptoms or worsening of your current condition.   Please go directly to the Emergency Department immediately should you begin to feel worse in any way or have any of the following symptoms: bowel/urinary incontinence, numbness between your legs, fever new onset numbness/tingling.

## 2023-03-19 DIAGNOSIS — N939 Abnormal uterine and vaginal bleeding, unspecified: Secondary | ICD-10-CM | POA: Diagnosis not present

## 2023-03-31 ENCOUNTER — Ambulatory Visit
Admission: EM | Admit: 2023-03-31 | Discharge: 2023-03-31 | Disposition: A | Discharge status: Home / Self Care | Payer: Medicaid Other | Attending: Urgent Care | Admitting: Urgent Care

## 2023-03-31 DIAGNOSIS — B3731 Acute candidiasis of vulva and vagina: Secondary | ICD-10-CM | POA: Diagnosis not present

## 2023-03-31 MED ORDER — FLUCONAZOLE 150 MG PO TABS: 150.0000 mg | ORAL_TABLET | ORAL | 0 refills | Status: DC

## 2023-03-31 NOTE — ED Provider Notes (Signed)
[facility]  Note:  This document was prepared using Conservation officer, historic buildings and may include unintentional dictation errors.  MRN: [id number] DOB: [birth year]  Subjective:   April Wallace is a 27 y.o. female presenting for 2 day history of recurrent vaginal irritation.  Has a history of significant recurrent yeast infections.  Has had BV before but much less commonly.  No known exposures.  No urinary symptoms.  Patient denies any chance of pregnancy as she is on Depo and oral birth control.  Cannot recall her last cycle.  No current facility-administered medications for this encounter.  Current Outpatient Medications:    norgestimate-ethinyl estradiol (ORTHO-CYCLEN) 0.25-35 MG-MCG tablet, Take 1 tablet by mouth daily., Disp: , Rfl:    acetaminophen (TYLENOL) 500 MG tablet, Take 2 tablets (1,000 mg total) by mouth every 6 (six) hours as needed., Disp: 30 tablet, Rfl: 0   Clindamycin Phos-Benzoyl Perox gel, Apply to the affected areas twice daily., Disp: 50 g, Rfl: 0   clindamycin-benzoyl peroxide (BENZACLIN) gel, Apply topically 2 (two) times daily., Disp: 25 g, Rfl: 0   cyclobenzaprine (FLEXERIL) 5 MG tablet, Take 1 tablet (5 mg total) by mouth 3 (three) times daily as needed for muscle spasms., Disp: 15 tablet, Rfl: 0   ferrous sulfate 325 (65 FE) MG tablet, Take 1 tablet (325 mg total) by mouth daily with breakfast for 14 days., Disp: 14 tablet, Rfl: 0   Guaifenesin 1200 MG TB12, Take 1 tablet (1,200 mg total) by mouth in the morning and at bedtime., Disp: 14 tablet, Rfl: 0   ibuprofen (ADVIL) 800 MG tablet, Take 1 tablet (800 mg total) by mouth every 8 (eight) hours as needed., Disp: 15 tablet, Rfl: 0   medroxyPROGESTERone (DEPO-PROVERA) 150 MG/ML injection, Inject into the muscle., Disp: , Rfl:    mupirocin ointment (BACTROBAN) 2 %, Apply 1 Application topically 2 (two) times daily., Disp: 22 g, Rfl: 0   No Known Allergies  Past Medical  History:  Diagnosis Date   Anemia    Headache      Past Surgical History:  Procedure Laterality Date   INDUCED ABORTION     WISDOM TOOTH EXTRACTION      Family History  Problem Relation Age of Onset   Diabetes Maternal Grandmother    Seizures Maternal Grandmother    Asthma Maternal Grandmother    Cancer Maternal Grandfather    Healthy Mother    Healthy Father     Social History   Tobacco Use   Smoking status: Never   Smokeless tobacco: Never  Vaping Use   Vaping Use: Never used  Substance Use Topics   Alcohol use: Yes    Comment: occ   Drug use: No    ROS   Objective:   Vitals: BP 109/74 (BP Location: Right Arm)   Pulse 85   Temp 99.4 F (37.4 C) (Oral)   Resp 15   LMP  (LMP Unknown)   SpO2 98%   Physical Exam Constitutional:      General: She is not in acute distress.    Appearance: Normal appearance. She is well-developed. She is not ill-appearing, toxic-appearing or diaphoretic.  HENT:     Head: Normocephalic and atraumatic.     Nose: Nose normal.     Mouth/Throat:     Mouth: Mucous membranes are moist.  Eyes:     General: No scleral icterus.       Right eye: No discharge.  Left eye: No discharge.     Extraocular Movements: Extraocular movements intact.  Cardiovascular:     Rate and Rhythm: Normal rate.  Pulmonary:     Effort: Pulmonary effort is normal.  Skin:    General: Skin is warm and dry.  Neurological:     General: No focal deficit present.     Mental Status: She is alert and oriented to person, place, and time.  Psychiatric:        Mood and Affect: Mood normal.        Behavior: Behavior normal.     Assessment and Plan :   PDMP not reviewed this encounter.  1. Yeast vaginitis     Declined empiric treatment for bacterial vaginosis.  Will treat empirically for yeast vaginitis with fluconazole, will be using an extended course given her history of frequent yeast infections.  Labs pending, will treat as appropriate  otherwise.  Counseled patient on potential for adverse effects with medications prescribed/recommended today, ER and return-to-clinic precautions discussed, patient verbalized understanding.    Wallis Bamberg, [place] 03/31/23 [id number]

## 2023-03-31 NOTE — ED Triage Notes (Signed)
Pt reports vaginal discomfort x 2 days. Reports she used Monistat today.

## 2023-04-01 ENCOUNTER — Ambulatory Visit
Admission: EM | Admit: 2023-04-01 | Discharge: 2023-04-01 | Disposition: A | Discharge status: Home / Self Care | Payer: Medicaid Other | Attending: Nurse Practitioner | Admitting: Nurse Practitioner

## 2023-04-01 DIAGNOSIS — N3000 Acute cystitis without hematuria: Secondary | ICD-10-CM | POA: Diagnosis not present

## 2023-04-01 LAB — POCT URINALYSIS DIP (MANUAL ENTRY)
Bilirubin, UA: NEGATIVE
Blood, UA: NEGATIVE
Glucose, UA: NEGATIVE mg/dL
Ketones, POC UA: NEGATIVE mg/dL
Nitrite, UA: NEGATIVE
Protein Ur, POC: NEGATIVE mg/dL
Spec Grav, UA: >=1.03 — AB (ref 1.010–1.025)
Urobilinogen, UA: 0.2 E.U./dL
pH, UA: 6 (ref 5.0–8.0)

## 2023-04-01 LAB — POCT URINE PREGNANCY: Preg Test, Ur: NEGATIVE

## 2023-04-01 MED ORDER — NITROFURANTOIN MONOHYD MACRO 100 MG PO CAPS: 100.0000 mg | ORAL_CAPSULE | Freq: Two times a day (BID) | ORAL | 0 refills | Status: AC

## 2023-04-01 NOTE — Discharge Instructions (Signed)
Macrobid twice daily for 7 days The clinic will contact you with results of the testing done today Rest and fluids Follow-up with your PCP if symptoms do not improve Please go to the ER for any worsening symptoms

## 2023-04-01 NOTE — ED Provider Notes (Signed)
[facility]    CSN: [id number] Arrival date & time: 04/01/23  1633      History   Chief Complaint Chief Complaint  Patient presents with   Urinary Tract Infection    HPI April Wallace is a 27 y.o. female presents for evaluation of dysuria.  Patient reports 1 day of urinary burning, urgency, frequency.  Denies hematuria, fevers, nausea/vomiting, flank pain.  She was seen yesterday in urgent care for vaginal discharge.  Still awaiting results of vaginal swab.  She was started on fluconazole.  She has not taken any OTC medications for symptoms.  No other concerns at this time.   Urinary Tract Infection   Past Medical History:  Diagnosis Date   Anemia    Headache     Patient Active Problem List   Diagnosis Date Noted   Anemia affecting pregnancy in first trimester 11/12/2017   Bacterial vaginitis 10/30/2017   Candida vaginitis 10/30/2017   Abdominal cramping affecting pregnancy 10/30/2017   Morning sickness 10/16/2017    Past Surgical History:  Procedure Laterality Date   INDUCED ABORTION     WISDOM TOOTH EXTRACTION      OB History     Gravida  3   Para      Term      Preterm      AB  2   Living         SAB  1   IAB      Ectopic      Multiple      Live Births               Home Medications    Prior to Admission medications   Medication Sig Start Date End Date Taking? Authorizing Provider  nitrofurantoin, macrocrystal-monohydrate, (MACROBID) 100 MG capsule Take 1 capsule (100 mg total) by mouth 2 (two) times daily for 7 days. 04/01/23 04/08/23 Yes Radford Pax, NP  acetaminophen (TYLENOL) 500 MG tablet Take 2 tablets (1,000 mg total) by mouth every 6 (six) hours as needed. 07/21/22   Carlisle Beers, FNP  ferrous sulfate 325 (65 FE) MG tablet Take 1 tablet (325 mg total) by mouth daily with breakfast for 14 days. 12/06/18 12/20/18  Shaune Pollack, MD  fluconazole (DIFLUCAN) 150 MG tablet Take 1 tablet (150 mg total) by  mouth every 3 (three) days. 03/31/23   Wallis Bamberg, PA-C  ibuprofen (ADVIL) 800 MG tablet Take 1 tablet (800 mg total) by mouth every 8 (eight) hours as needed. 03/14/23   Hermanns, Ashlee P, PA-C  medroxyPROGESTERone (DEPO-PROVERA) 150 MG/ML injection Inject into the muscle. 12/04/22 02/04/26  [provider]  mupirocin ointment (BACTROBAN) 2 % Apply 1 Application topically 2 (two) times daily. 12/23/22   Carlisle Beers, FNP  norgestimate-ethinyl estradiol (ORTHO-CYCLEN) 0.25-35 MG-MCG tablet Take 1 tablet by mouth daily. 03/19/23   [provider]    Family History Family History  Problem Relation Age of Onset   Diabetes Maternal Grandmother    Seizures Maternal Grandmother    Asthma Maternal Grandmother    Cancer Maternal Grandfather    Healthy Mother    Healthy Father     Social History Social History   Tobacco Use   Smoking status: Never   Smokeless tobacco: Never  Vaping Use   Vaping Use: Never used  Substance Use Topics   Alcohol use: Yes    Comment: occ   Drug use: No     Allergies   Patient has  no known allergies.   Review of Systems Review of Systems  Genitourinary:  Positive for dysuria.     Physical Exam Triage Vital Signs ED Triage Vitals  Enc Vitals Group     BP 04/01/23 1819 111/76     Pulse Rate 04/01/23 1819 93     Resp 04/01/23 1819 15     Temp 04/01/23 1819 99.3 F (37.4 C)     Temp Source 04/01/23 1819 Oral     SpO2 04/01/23 1819 98 %     Weight --      Height --      Head Circumference --      Peak Flow --      Pain Score 04/01/23 1824 10     Pain Loc --      Pain Edu? --      Excl. in GC? --    No data found.  Updated Vital Signs BP 111/76 (BP Location: Right Arm)   Pulse 93   Temp 99.3 F (37.4 C) (Oral)   Resp 15   LMP  (LMP Unknown)   SpO2 98%   Visual Acuity Right Eye Distance:   Left Eye Distance:   Bilateral Distance:    Right Eye Near:   Left Eye Near:    Bilateral Near:     Physical  Exam Vitals and nursing note reviewed.  Constitutional:      Appearance: Normal appearance.  HENT:     Head: Normocephalic and atraumatic.  Eyes:     Pupils: Pupils are equal, round, and reactive to light.  Cardiovascular:     Rate and Rhythm: Normal rate.  Pulmonary:     Effort: Pulmonary effort is normal.  Abdominal:     Tenderness: There is no right CVA tenderness or left CVA tenderness.  Skin:    General: Skin is warm and dry.  Neurological:     General: No focal deficit present.     Mental Status: She is alert and oriented to person, place, and time.  Psychiatric:        Mood and Affect: Mood normal.        Behavior: Behavior normal.      UC Treatments / Results  Labs (all labs ordered are listed, but only abnormal results are displayed) Labs Reviewed  POCT URINALYSIS DIP (MANUAL ENTRY) - Abnormal; Notable for the following components:      Result Value   Clarity, UA cloudy (*)    Spec Grav, UA >=1.030 (*)    Leukocytes, UA Small (1+) (*)    All other components within normal limits  URINE CULTURE  POCT URINE PREGNANCY    EKG   Radiology No results found.  Procedures Procedures (including critical care time)  Medications Ordered in UC Medications - No data to display  Initial Impression / Assessment and Plan / UC Course  I have reviewed the triage vital signs and the nursing notes.  Pertinent labs & imaging results that were available during my care of the patient were reviewed by me and considered in my medical decision making (see chart for details).     UA positive for UTI, will culture and start Macrobid Rest and fluids PCP follow-up if symptoms do not improve ER precautions reviewed and patient verbalized understanding Final Clinical Impressions(s) / UC Diagnoses   Final diagnoses:  Acute cystitis without hematuria     Discharge Instructions      Macrobid twice daily for 7 days The clinic will contact  you with results of the testing  done today Rest and fluids Follow-up with your PCP if symptoms do not improve Please go to the ER for any worsening symptoms   ED Prescriptions     Medication Sig Dispense Auth. Provider   nitrofurantoin, macrocrystal-monohydrate, (MACROBID) 100 MG capsule Take 1 capsule (100 mg total) by mouth 2 (two) times daily for 7 days. 14 capsule Radford Pax, NP      PDMP not reviewed this encounter.   Radford Pax, NP 04/01/23 [phone]

## 2023-04-01 NOTE — ED Triage Notes (Signed)
Pt states was seen here yesterday for yeast infection but her swab hasn't resulted. States today having burning on urination.

## 2023-04-02 LAB — CERVICOVAGINAL ANCILLARY ONLY
Bacterial Vaginitis (gardnerella): POSITIVE — AB
Candida Glabrata: NEGATIVE
Candida Vaginitis: POSITIVE — AB
Chlamydia: NEGATIVE
Comment: NEGATIVE
Comment: NEGATIVE
Comment: NEGATIVE
Comment: NEGATIVE
Comment: NEGATIVE
Comment: NORMAL
Neisseria Gonorrhea: NEGATIVE
Trichomonas: NEGATIVE

## 2023-04-02 LAB — URINE CULTURE: Culture: 10000 — AB

## 2023-04-03 ENCOUNTER — Telehealth (HOSPITAL_COMMUNITY): Payer: Self-pay | Admitting: Emergency Medicine

## 2023-04-03 MED ORDER — METRONIDAZOLE 500 MG PO TABS: 500.0000 mg | ORAL_TABLET | Freq: Two times a day (BID) | ORAL | 0 refills | Status: DC

## 2023-05-06 DIAGNOSIS — Z3041 Encounter for surveillance of contraceptive pills: Secondary | ICD-10-CM | POA: Diagnosis not present

## 2023-05-22 DIAGNOSIS — N939 Abnormal uterine and vaginal bleeding, unspecified: Secondary | ICD-10-CM | POA: Diagnosis not present

## 2023-05-22 DIAGNOSIS — Z309 Encounter for contraceptive management, unspecified: Secondary | ICD-10-CM | POA: Diagnosis not present

## 2023-05-31 ENCOUNTER — Telehealth: Payer: Medicaid Other | Admitting: Family Medicine

## 2023-05-31 DIAGNOSIS — B3731 Acute candidiasis of vulva and vagina: Secondary | ICD-10-CM

## 2023-05-31 DIAGNOSIS — N760 Acute vaginitis: Secondary | ICD-10-CM | POA: Diagnosis not present

## 2023-05-31 MED ORDER — FLUCONAZOLE 150 MG PO TABS: 150.0000 mg | ORAL_TABLET | Freq: Once | ORAL | 0 refills | Status: AC

## 2023-05-31 NOTE — Progress Notes (Signed)

## 2023-06-01 ENCOUNTER — Emergency Department (HOSPITAL_COMMUNITY)
Admission: EM | Admit: 2023-06-01 | Discharge: 2023-06-02 | Disposition: A | Discharge status: Home / Self Care | Payer: Medicaid Other | Attending: Emergency Medicine | Admitting: Emergency Medicine

## 2023-06-01 ENCOUNTER — Encounter (HOSPITAL_COMMUNITY): Payer: Self-pay

## 2023-06-01 ENCOUNTER — Other Ambulatory Visit: Payer: Self-pay

## 2023-06-01 DIAGNOSIS — N760 Acute vaginitis: Secondary | ICD-10-CM | POA: Diagnosis not present

## 2023-06-01 DIAGNOSIS — R102 Pelvic and perineal pain: Secondary | ICD-10-CM | POA: Diagnosis present

## 2023-06-01 LAB — URINALYSIS, ROUTINE W REFLEX MICROSCOPIC
Bilirubin Urine: NEGATIVE
Glucose, UA: NEGATIVE mg/dL
Hgb urine dipstick: NEGATIVE
Ketones, ur: NEGATIVE mg/dL
Nitrite: NEGATIVE
Protein, ur: NEGATIVE mg/dL
Specific Gravity, Urine: 1.019 (ref 1.005–1.030)
WBC, UA: >50 WBC/hpf (ref 0–5)
pH: 6 (ref 5.0–8.0)

## 2023-06-01 LAB — PREGNANCY, URINE: Preg Test, Ur: NEGATIVE

## 2023-06-01 NOTE — ED Triage Notes (Signed)
Pt reports with vaginal pain/swelling for 3 days. Pt states that she was recently diagnosed with a UTI and has been taking abts.

## 2023-06-01 NOTE — ED Provider Notes (Signed)
[place] EMERGENCY DEPARTMENT AT [facility] Provider Note   CSN: [id number] Arrival date & time: 06/01/23  2234     History {Add pertinent medical, surgical, social history, OB history to HPI:1} Chief Complaint  Patient presents with   Vaginal Pain    April Wallace is a 27 y.o. female.  Patient with "vaginal discomfort" for the past 3 days.  Some white discharge.  She was in urgent care yesterday and diagnosed with yeast infection as well as UTI.  She was given 1 dose of fluconazole and started on Keflex.  She comes in today because she is having more discomfort and feels her vagina is "swollen".  Increased discomfort and discharge today.  No pain with urination.  No blood in the urine.  No chest pain, abdominal pain, back pain, fever.   Feels well.  Eating and drinking well moving bowels normally.  The history is provided by the patient.  Vaginal Pain Pertinent negatives include no chest pain, no abdominal pain, no headaches and no shortness of breath.       Home Medications Prior to Admission medications   Medication Sig Start Date End Date Taking? Authorizing Provider  acetaminophen (TYLENOL) 500 MG tablet Take 2 tablets (1,000 mg total) by mouth every 6 (six) hours as needed. 07/21/22   Carlisle Beers, FNP  ferrous sulfate 325 (65 FE) MG tablet Take 1 tablet (325 mg total) by mouth daily with breakfast for 14 days. 12/06/18 12/20/18  Shaune Pollack, MD  ibuprofen (ADVIL) 800 MG tablet Take 1 tablet (800 mg total) by mouth every 8 (eight) hours as needed. 03/14/23   Hermanns, Ashlee P, PA-C  medroxyPROGESTERone (DEPO-PROVERA) 150 MG/ML injection Inject into the muscle. 12/04/22 02/04/26  [provider]  metroNIDAZOLE (FLAGYL) 500 MG tablet Take 1 tablet (500 mg total) by mouth 2 (two) times daily. 04/03/23   LampteyBritta Mccreedy, MD  mupirocin ointment (BACTROBAN) 2 % Apply 1 Application topically 2 (two) times daily. 12/23/22   Carlisle Beers,  FNP  norgestimate-ethinyl estradiol (ORTHO-CYCLEN) 0.25-35 MG-MCG tablet Take 1 tablet by mouth daily. 03/19/23   [provider]      Allergies    Patient has no known allergies.    Review of Systems   Review of Systems  Constitutional:  Negative for activity change, appetite change and fever.  HENT:  Negative for congestion and rhinorrhea.   Respiratory:  Negative for cough, chest tightness and shortness of breath.   Cardiovascular:  Negative for chest pain.  Gastrointestinal:  Negative for abdominal pain, nausea and vomiting.  Genitourinary:  Positive for dysuria, hematuria, urgency and vaginal pain. Negative for vaginal bleeding.  Musculoskeletal:  Negative for arthralgias and myalgias.  Skin:  Negative for rash.  Neurological:  Negative for weakness and headaches.   all other systems are negative except as noted in the HPI and PMH.    Physical Exam Updated Vital Signs BP 94/78 (BP Location: Left Arm)   Pulse (!) 110   Temp 98.3 F (36.8 C) (Oral)   Resp 18   Ht 5\' 4"  (1.626 m)   Wt 54.4 kg   SpO2 95%   BMI 20.60 kg/m  Physical Exam Vitals and nursing note reviewed.  Constitutional:      General: She is not in acute distress.    Appearance: She is well-developed.  HENT:     Head: Normocephalic and atraumatic.     Mouth/Throat:     Pharynx: No oropharyngeal exudate.  Eyes:     Conjunctiva/sclera: Conjunctivae normal.     Pupils: Pupils are equal, round, and reactive to light.  Neck:     Comments: No meningismus. Cardiovascular:     Rate and Rhythm: Normal rate and regular rhythm.     Heart sounds: Normal heart sounds. No murmur heard. Pulmonary:     Effort: Pulmonary effort is normal. No respiratory distress.     Breath sounds: Normal breath sounds.  Abdominal:     Palpations: Abdomen is soft.     Tenderness: There is no abdominal tenderness. There is no guarding or rebound.  Genitourinary:    Comments: Chaperone present GU RN.  Normal center  genitalia.  There is perhaps some slight swelling of the labia minora.  No appreciable erythema or fluctuance. White discharge in vaginal vault with copious yeast. No CMT.  No lateralizing adnexal tenderness Musculoskeletal:        General: No tenderness. Normal range of motion.     Cervical back: Normal range of motion and neck supple.  Skin:    General: Skin is warm.  Neurological:     Mental Status: She is alert and oriented to person, place, and time.     Cranial Nerves: No cranial nerve deficit.     Motor: No abnormal muscle tone.     Coordination: Coordination normal.     Comments:  5/5 strength throughout. CN 2-12 intact.Equal grip strength.   Psychiatric:        Behavior: Behavior normal.     ED Results / Procedures / Treatments   Labs (all labs ordered are listed, but only abnormal results are displayed) Labs Reviewed  WET PREP, GENITAL  URINALYSIS, ROUTINE W REFLEX MICROSCOPIC  PREGNANCY, URINE  GC/CHLAMYDIA PROBE AMP ([place]) NOT AT [facility]    EKG None  Radiology No results found.  Procedures Procedures  {Document cardiac monitor, telemetry assessment procedure when appropriate:1}  Medications Ordered in ED Medications - No data to display  ED Course/ Medical Decision Making/ A&P   {   Click here for ABCD2, HEART and other calculatorsREFRESH Note before signing :1}                          Medical Decision Making Amount and/or Complexity of Data Reviewed Labs: ordered. Decision-making details documented in ED Course. Radiology: ordered and independent interpretation performed. Decision-making details documented in ED Course. ECG/medicine tests: ordered and independent interpretation performed. Decision-making details documented in ED Course.   Vaginal discomfort for the past 3 days with increased discharge.  Recently treated for UTI as well as yeast infection.  Vitals are stable, no distress, no abdominal tenderness  {Document critical care time  when appropriate:1} {Document review of labs and clinical decision tools ie heart score, Chads2Vasc2 etc:1}  {Document your independent review of radiology images, and any outside records:1} {Document your discussion with family members, caretakers, and with consultants:1} {Document social determinants of health affecting pt's care:1} {Document your decision making why or why not admission, treatments were needed:1} Final Clinical Impression(s) / ED Diagnoses Final diagnoses:  None    Rx / DC Orders ED Discharge Orders     None

## 2023-06-02 LAB — WET PREP, GENITAL
Clue Cells Wet Prep HPF POC: NONE SEEN
Sperm: NONE SEEN
Trich, Wet Prep: NONE SEEN
WBC, Wet Prep HPF POC: >=10 — AB (ref <10)
Yeast Wet Prep HPF POC: NONE SEEN

## 2023-06-02 NOTE — ED Notes (Signed)
Patient given discharge instructions and follow up care. Patient verbalized understanding. Patient ambulatory out of ED by self. 

## 2023-06-02 NOTE — Discharge Instructions (Addendum)
Continue your antibiotics as well as Diflucan.  You declined treatment for gonorrhea chlamydia today.  You will be called if the swabs are positive.  Follow-up with a gynecologist.  Return to the ED with new or worsening symptoms.

## 2023-06-03 LAB — GC/CHLAMYDIA PROBE AMP (~~LOC~~) NOT AT ARMC
Chlamydia: NEGATIVE
Comment: NEGATIVE
Comment: NORMAL
Neisseria Gonorrhea: NEGATIVE

## 2023-06-21 ENCOUNTER — Other Ambulatory Visit: Payer: Self-pay | Admitting: Nurse Practitioner

## 2023-06-21 ENCOUNTER — Telehealth: Payer: Medicaid Other | Admitting: Emergency Medicine

## 2023-06-21 DIAGNOSIS — N898 Other specified noninflammatory disorders of vagina: Secondary | ICD-10-CM | POA: Diagnosis not present

## 2023-06-21 MED ORDER — FLUCONAZOLE 150 MG PO TABS: 150.0000 mg | ORAL_TABLET | Freq: Once | ORAL | 0 refills | Status: AC

## 2023-06-21 MED ORDER — FLUCONAZOLE 150 MG PO TABS: 150.0000 mg | ORAL_TABLET | Freq: Once | ORAL | 0 refills | Status: DC

## 2023-06-21 NOTE — Progress Notes (Signed)

## 2023-07-11 ENCOUNTER — Encounter (HOSPITAL_COMMUNITY): Payer: Self-pay

## 2023-07-11 ENCOUNTER — Emergency Department (HOSPITAL_COMMUNITY)
Admission: EM | Admit: 2023-07-11 | Discharge: 2023-07-12 | Discharge status: Against Medical Advice | Payer: Medicaid Other | Attending: Emergency Medicine | Admitting: Emergency Medicine

## 2023-07-11 ENCOUNTER — Other Ambulatory Visit: Payer: Self-pay

## 2023-07-11 DIAGNOSIS — Z113 Encounter for screening for infections with a predominantly sexual mode of transmission: Secondary | ICD-10-CM | POA: Insufficient documentation

## 2023-07-11 DIAGNOSIS — Z1152 Encounter for screening for COVID-19: Secondary | ICD-10-CM | POA: Insufficient documentation

## 2023-07-11 DIAGNOSIS — R519 Headache, unspecified: Secondary | ICD-10-CM | POA: Diagnosis not present

## 2023-07-11 DIAGNOSIS — Z5321 Procedure and treatment not carried out due to patient leaving prior to being seen by health care provider: Secondary | ICD-10-CM | POA: Diagnosis not present

## 2023-07-11 NOTE — ED Triage Notes (Addendum)
Says she has been having recurrent headaches for the past week which is not typical for her.   Says they are constant with maybe a 1 hour break throughout the day.   Request for STD screening.

## 2023-07-12 ENCOUNTER — Emergency Department (HOSPITAL_COMMUNITY)
Admission: EM | Admit: 2023-07-12 | Discharge: 2023-07-12 | Disposition: A | Discharge status: Home / Self Care | Payer: Medicaid Other | Source: Home / Self Care | Attending: Emergency Medicine | Admitting: Emergency Medicine

## 2023-07-12 ENCOUNTER — Other Ambulatory Visit: Payer: Self-pay

## 2023-07-12 ENCOUNTER — Emergency Department (HOSPITAL_COMMUNITY): Payer: Medicaid Other

## 2023-07-12 ENCOUNTER — Encounter (HOSPITAL_COMMUNITY): Payer: Self-pay

## 2023-07-12 DIAGNOSIS — R519 Headache, unspecified: Secondary | ICD-10-CM | POA: Insufficient documentation

## 2023-07-12 DIAGNOSIS — Z1152 Encounter for screening for COVID-19: Secondary | ICD-10-CM | POA: Insufficient documentation

## 2023-07-12 LAB — COMPREHENSIVE METABOLIC PANEL
ALT: 8 U/L (ref 0–44)
AST: 16 U/L (ref 15–41)
Albumin: 3.7 g/dL (ref 3.5–5.0)
Alkaline Phosphatase: 47 U/L (ref 38–126)
Anion gap: 8 (ref 5–15)
BUN: 13 mg/dL (ref 6–20)
CO2: 19 mmol/L — ABNORMAL LOW (ref 22–32)
Calcium: 8.9 mg/dL (ref 8.9–10.3)
Chloride: 110 mmol/L (ref 98–111)
Creatinine, Ser: 0.74 mg/dL (ref 0.44–1.00)
GFR, Estimated: >60 mL/min (ref >60)
Glucose, Bld: 144 mg/dL — ABNORMAL HIGH (ref 70–99)
Potassium: 3.5 mmol/L (ref 3.5–5.1)
Sodium: 137 mmol/L (ref 135–145)
Total Bilirubin: <0.1 mg/dL — ABNORMAL LOW (ref 0.3–1.2)
Total Protein: 7 g/dL (ref 6.5–8.1)

## 2023-07-12 LAB — CBC WITH DIFFERENTIAL/PLATELET
Abs Immature Granulocytes: 0.03 10*3/uL (ref 0.00–0.07)
Basophils Absolute: 0 10*3/uL (ref 0.0–0.1)
Basophils Relative: 0 %
Eosinophils Absolute: 0.2 10*3/uL (ref 0.0–0.5)
Eosinophils Relative: 3 %
HCT: 28.3 % — ABNORMAL LOW (ref 36.0–46.0)
Hemoglobin: 8.4 g/dL — ABNORMAL LOW (ref 12.0–15.0)
Immature Granulocytes: 1 %
Lymphocytes Relative: 44 %
Lymphs Abs: 2.4 10*3/uL (ref 0.7–4.0)
MCH: 22.7 pg — ABNORMAL LOW (ref 26.0–34.0)
MCHC: 29.7 g/dL — ABNORMAL LOW (ref 30.0–36.0)
MCV: 76.5 fL — ABNORMAL LOW (ref 80.0–100.0)
Monocytes Absolute: 0.5 10*3/uL (ref 0.1–1.0)
Monocytes Relative: 8 %
Neutro Abs: 2.4 10*3/uL (ref 1.7–7.7)
Neutrophils Relative %: 44 %
Platelets: 223 10*3/uL (ref 150–400)
RBC: 3.7 MIL/uL — ABNORMAL LOW (ref 3.87–5.11)
RDW: 16.6 % — ABNORMAL HIGH (ref 11.5–15.5)
WBC: 5.5 10*3/uL (ref 4.0–10.5)
nRBC: 0 % (ref 0.0–0.2)

## 2023-07-12 LAB — SARS CORONAVIRUS 2 BY RT PCR: SARS Coronavirus 2 by RT PCR: NEGATIVE

## 2023-07-12 LAB — HCG, QUANTITATIVE, PREGNANCY: hCG, Beta Chain, Quant, S: 1 m[IU]/mL (ref <5)

## 2023-07-12 MED ORDER — DEXAMETHASONE SODIUM PHOSPHATE 4 MG/ML IJ SOLN
4.0000 mg | Freq: Once | INTRAMUSCULAR | Status: AC
  Administered 2023-07-12: 4 mg via INTRAVENOUS
  Filled 2023-07-12: qty 1

## 2023-07-12 MED ORDER — METOCLOPRAMIDE HCL 5 MG/ML IJ SOLN
10.0000 mg | Freq: Once | INTRAMUSCULAR | Status: AC
  Administered 2023-07-12: 10 mg via INTRAVENOUS
  Filled 2023-07-12: qty 2

## 2023-07-12 MED ORDER — SODIUM CHLORIDE 0.9 % IV BOLUS
1000.0000 mL | Freq: Once | INTRAVENOUS | Status: AC
  Administered 2023-07-12: 1000 mL via INTRAVENOUS

## 2023-07-12 MED ORDER — DIPHENHYDRAMINE HCL 50 MG/ML IJ SOLN
25.0000 mg | Freq: Once | INTRAMUSCULAR | Status: AC
  Administered 2023-07-12: 25 mg via INTRAVENOUS
  Filled 2023-07-12: qty 1

## 2023-07-12 MED ORDER — KETOROLAC TROMETHAMINE 30 MG/ML IJ SOLN
30.0000 mg | Freq: Once | INTRAMUSCULAR | Status: AC
  Administered 2023-07-12: 30 mg via INTRAVENOUS
  Filled 2023-07-12: qty 1

## 2023-07-12 MED ORDER — METOCLOPRAMIDE HCL 10 MG PO TABS: 10.0000 mg | ORAL_TABLET | Freq: Four times a day (QID) | ORAL | 0 refills | Status: DC | PRN

## 2023-07-12 NOTE — Discharge Instructions (Signed)
Continue Tylenol or Motrin for headache and Reglan for severe headache  Please stay hydrated and get plenty of rest  See neurology for follow-up you have headaches  Return to ER if you have worse headaches, vomiting

## 2023-07-12 NOTE — ED Triage Notes (Signed)
Pt c/o Hax1wk. Pt denies N/V. Pt c/o blurred vision

## 2023-07-12 NOTE — ED Notes (Signed)
Pt stepped out. Pt called 3 times within hour span. No response. Pt did not return.

## 2023-07-12 NOTE — ED Provider Triage Note (Signed)
Emergency Medicine Provider Triage Evaluation Note  April Wallace , a 27 y.o. female  was evaluated in triage.  Pt complains of headache intermittently for the last week.  No nausea or vomiting, no fevers, no cough or cold symptoms.  No urinary symptoms.  No history of prior migrainous type headaches.  Had a fall 1 month ago and has developed significant headache since that time..  Review of Systems  Positive: Headache Negative: Neck pain, fevers, vomiting  Physical Exam  BP 115/82 (BP Location: Right Arm)   Pulse 93   Temp 98 F (36.7 C) (Oral)   Resp 14   Ht 5\' 4"  (1.626 m)   Wt 54.4 kg   SpO2 100%   BMI 20.59 kg/m  Gen:   Awake, no distress    Resp:  Normal effort   MSK:   Moves extremities without difficulty   Other:     Medical Decision Making  Medically screening exam initiated at 10:05 AM.  Appropriate orders placed.  April Wallace was informed that the remainder of the evaluation will be completed by another provider, this initial triage assessment does not replace that evaluation, and the importance of remaining in the ED until their evaluation is complete.      Rolan Bucco, MD 07/12/23 1005

## 2023-07-12 NOTE — ED Provider Notes (Signed)
[place] EMERGENCY DEPARTMENT AT [facility] Provider Note   CSN: [id number] Arrival date & time: 07/12/23  4259     History  Chief Complaint  Patient presents with   Headache    April Wallace is a 27 y.o. female headaches.  Patient has been having headache for the last week or so.  Patient states that she has been taking Tylenol Motrin with no relief.  Patient has some photophobia as well.  Denies any neck pain or fever.  The history is provided by the patient.       Home Medications Prior to Admission medications   Medication Sig Start Date End Date Taking? Authorizing Provider  ferrous sulfate 325 (65 FE) MG tablet Take 1 tablet (325 mg total) by mouth daily with breakfast for 14 days. 12/06/18 12/20/18  Shaune Pollack, MD  medroxyPROGESTERone (DEPO-PROVERA) 150 MG/ML injection Inject into the muscle. 12/04/22 02/04/26  [provider]  metroNIDAZOLE (FLAGYL) 500 MG tablet Take 1 tablet (500 mg total) by mouth 2 (two) times daily. Patient not taking: Reported on 06/02/2023 04/03/23   Merrilee Jansky, MD  norgestimate-ethinyl estradiol (ORTHO-CYCLEN) 0.25-35 MG-MCG tablet Take 1 tablet by mouth daily. 03/19/23   [provider]      Allergies    Patient has no known allergies.    Review of Systems   Review of Systems  Neurological:  Positive for headaches.  All other systems reviewed and are negative.   Physical Exam Updated Vital Signs BP 106/73 (BP Location: Left Arm)   Pulse 64   Temp 98 F (36.7 C) (Oral)   Resp 14   Ht 5\' 4"  (1.626 m)   Wt 54.4 kg   SpO2 100%   BMI 20.59 kg/m  Physical Exam Vitals and nursing note reviewed.  Constitutional:      Comments: Slightly uncomfortable  HENT:     Head: Normocephalic.     Mouth/Throat:     Mouth: Mucous membranes are moist.  Eyes:     Extraocular Movements: Extraocular movements intact.     Pupils: Pupils are equal, round, and reactive to light.  Cardiovascular:     Rate  and Rhythm: Normal rate and regular rhythm.     Heart sounds: Normal heart sounds.  Pulmonary:     Effort: Pulmonary effort is normal.     Breath sounds: Normal breath sounds.  Abdominal:     General: Bowel sounds are normal.     Palpations: Abdomen is soft.  Musculoskeletal:        General: Normal range of motion.     Cervical back: Normal range of motion and neck supple.  Skin:    General: Skin is warm.  Neurological:     Mental Status: She is alert and oriented to person, place, and time.     Cranial Nerves: No cranial nerve deficit, dysarthria or facial asymmetry.  Psychiatric:        Mood and Affect: Mood normal.        Behavior: Behavior normal.     ED Results / Procedures / Treatments   Labs (all labs ordered are listed, but only abnormal results are displayed) Labs Reviewed  CBC WITH DIFFERENTIAL/PLATELET - Abnormal; Notable for the following components:      Result Value   RBC 3.70 (*)    Hemoglobin 8.4 (*)    HCT 28.3 (*)    MCV 76.5 (*)    MCH 22.7 (*)    MCHC 29.7 (*)  RDW 16.6 (*)    All other components within normal limits  COMPREHENSIVE METABOLIC PANEL - Abnormal; Notable for the following components:   CO2 19 (*)    Glucose, Bld 144 (*)    Total Bilirubin <0.1 (*)    All other components within normal limits  SARS CORONAVIRUS 2 BY RT PCR  HCG, QUANTITATIVE, PREGNANCY    EKG None  Radiology CT Head Wo Contrast  Result Date: 07/12/2023 CLINICAL DATA:  27 year old female with headache for 1 week. Blurred vision. Symptom onset after a fall 1 month ago. EXAM: CT HEAD WITHOUT CONTRAST TECHNIQUE: Contiguous axial images were obtained from the base of the skull through the vertex without intravenous contrast. RADIATION DOSE REDUCTION: This exam was performed according to the departmental dose-optimization program which includes automated exposure control, adjustment of the mA and/or kV according to patient size and/or use of iterative reconstruction  technique. COMPARISON:  None Available. FINDINGS: Brain: Normal cerebral volume. No midline shift, ventriculomegaly, mass effect, evidence of mass lesion, intracranial hemorrhage or evidence of cortically based acute infarction. Gray-white matter differentiation is within normal limits throughout the brain. Vascular: No suspicious intracranial vascular hyperdensity. Skull: Intact, negative. Sinuses/Orbits: Visualized paranasal sinuses and mastoids are clear. Other: Visualized orbits and scalp soft tissues are within normal limits. IMPRESSION: Normal noncontrast Head CT. Electronically Signed   By: Odessa Fleming M.D.   On: 07/12/2023 11:46    Procedures Procedures    Medications Ordered in ED Medications  metoCLOPramide (REGLAN) injection 10 mg (10 mg Intravenous Given 07/12/23 1622)  diphenhydrAMINE (BENADRYL) injection 25 mg (25 mg Intravenous Given 07/12/23 1621)  sodium chloride 0.9 % bolus 1,000 mL (1,000 mLs Intravenous New Bag/Given 07/12/23 1622)  ketorolac (TORADOL) 30 MG/ML injection 30 mg (30 mg Intravenous Given 07/12/23 1622)  dexamethasone (DECADRON) injection 4 mg (4 mg Intravenous Given 07/12/23 1622)    ED Course/ Medical Decision Making/ A&P                             Medical Decision Making April Wallace is a 27 y.o. female here presenting with headache.  Patient has been having worsening headache about a week or so.  Nonfocal neuroexam.  Given new onset headache will get CT head to rule out mass or bleed.  Will get basic blood work and give migraine cocktail  6:41 PM Labs and CT head unremarkable.  Felt better now, stable for discharge     Problems Addressed: Nonintractable headache, unspecified chronicity pattern, unspecified headache type: acute illness or injury  Amount and/or Complexity of Data Reviewed Labs: ordered. Decision-making details documented in ED Course.  Risk Prescription drug management.    Final Clinical Impression(s) / ED Diagnoses Final  diagnoses:  None    Rx / DC Orders ED Discharge Orders     None         Charlynne Pander, MD 07/12/23 1842

## 2023-07-16 ENCOUNTER — Ambulatory Visit
Admission: EM | Admit: 2023-07-16 | Discharge: 2023-07-16 | Disposition: A | Discharge status: Home / Self Care | Payer: Medicaid Other | Attending: Emergency Medicine | Admitting: Emergency Medicine

## 2023-07-16 DIAGNOSIS — Z113 Encounter for screening for infections with a predominantly sexual mode of transmission: Secondary | ICD-10-CM

## 2023-07-16 NOTE — Discharge Instructions (Signed)
Your labs will be back in several days.  Refrain from intercourse until all of your labs have been resulted.  We will contact you if they are abnormal and initiate the appropriate treatment and follow-up.  Below is a list of primary care practices who are taking new patients for you to follow-up with.  [facility] -multiple locations.  See website at [email or web address]  [facility] internal medicine clinic Ground Floor - [facility], [address] [place], [place], [place] [address] [phone]  [facility] Primary Care at [facility] [address] [place], [place] [address] [phone]  [facility]- will see patients with no insurance [address] [place], [place] [address] [place], [place] [address] [phone]  [facility] [phone] [address] [place] [address]  [facility]: [address] [place] [address]  [phone]  [facility] Family Medicine: [address] [place] [address]  [phone]  [place] primary care : [address] [place] [address] [phone]  [facility] Primary Care: [address] [place] [address] [phone]  [facility]: [address] [place] [address] [phone]  Dr. Oneal Grout [address] [place]  [facility]- will see patients with no insurance. [address], [place], [place] [address] [phone]  Go to www.[email or web address]  or [email or web address] to look up your medications. This will give you a list of where you can find your prescriptions at the most affordable prices. Or ask the pharmacist what the cash price is, or if they have any other discount programs available to help make your medication more affordable. This can be less expensive than  what you would pay with insurance.

## 2023-07-16 NOTE — ED Notes (Signed)
Patient is on personal phone, provider informed the patient she would need to be off the phone before they could be seen or lab work done. Ive went back into the room and the patient is still on the phone so I informed her that when she is off the phone we will proceed.

## 2023-07-16 NOTE — ED Provider Notes (Signed)
HPI  SUBJECTIVE:  April Wallace is a 27 y.o. female who presents for HIV and syphilis screening.  She has no complaints-no GU, abdominal, urinary complaints, nausea or vomiting, fevers.  Her last intercourse was 2 to 3 months ago with a female partner, who is asymptomatic.  She states that neither she nor her partner have other partners.  She does not want a vaginal swab today-states that she recently did one which was negative.  She would like the screening as a baseline.  She has a past medical history of chlamydia, yeast and anemia.  No other history of other STDs.  LMP: Now.  Is on Depo.  Denies the possibility being pregnant.  PCP: None.    Past Medical History:  Diagnosis Date   Anemia    Headache     Past Surgical History:  Procedure Laterality Date   INDUCED ABORTION     WISDOM TOOTH EXTRACTION      Family History  Problem Relation Age of Onset   Diabetes Maternal Grandmother    Seizures Maternal Grandmother    Asthma Maternal Grandmother    Cancer Maternal Grandfather    Healthy Mother    Healthy Father     Social History   Tobacco Use   Smoking status: Never   Smokeless tobacco: Never  Vaping Use   Vaping status: Never Used  Substance Use Topics   Alcohol use: Yes    Comment: occ   Drug use: No    No current facility-administered medications for this encounter.  Current Outpatient Medications:    ferrous sulfate 325 (65 FE) MG tablet, Take 1 tablet (325 mg total) by mouth daily with breakfast for 14 days., Disp: 14 tablet, Rfl: 0   medroxyPROGESTERone (DEPO-PROVERA) 150 MG/ML injection, Inject into the muscle., Disp: , Rfl:    metoCLOPramide (REGLAN) 10 MG tablet, Take 1 tablet (10 mg total) by mouth every 6 (six) hours as needed for nausea (nausea/headache)., Disp: 10 tablet, Rfl: 0   metroNIDAZOLE (FLAGYL) 500 MG tablet, Take 1 tablet (500 mg total) by mouth 2 (two) times daily. (Patient not taking: Reported on 06/02/2023), Disp: 14 tablet, Rfl: 0    norgestimate-ethinyl estradiol (ORTHO-CYCLEN) 0.25-35 MG-MCG tablet, Take 1 tablet by mouth daily., Disp: , Rfl:   No Known Allergies   ROS  As noted in HPI.   Physical Exam  BP 103/68 (BP Location: Right Arm)   Pulse 65   Temp 99.5 F (37.5 C) (Oral)   Resp 17   SpO2 99%   Constitutional: Well developed, well nourished, no acute distress Eyes:  EOMI, conjunctiva normal bilaterally HENT: Normocephalic, atraumatic,mucus membranes moist Respiratory: Normal inspiratory effort Cardiovascular: Normal rate GI: nondistended no suprapubic, flank tenderness Back: No CVAT skin: No rash, skin intact Musculoskeletal: no deformities Neurologic: Alert & oriented x 3, no focal neuro deficits Psychiatric: Speech and behavior appropriate   ED Course   Medications - No data to display  Orders Placed This Encounter  Procedures   HIV Antibody (routine testing w rflx)    Standing Status:   Standing    Number of Occurrences:   1   RPR    Standing Status:   Standing    Number of Occurrences:   1    No results found for this or any previous visit (from the past 24 hour(s)). No results found.  ED Clinical Impression  1. Screening for STDs (sexually transmitted diseases)      ED Assessment/Plan    0950-went  in to check on patient, she is on the phone.  Would like for me to come back later. 1000-still on phone.  Will come back later.  Sending off HIV, RPR.  Patient states she recently had vaginal swab testing.  Will provide primary care list for routine care.  Discussed labs, MDM, treatment plan, and plan for follow-up with patient.  patient agrees with plan.   No orders of the defined types were placed in this encounter.     *This clinic note was created using Dragon dictation software. Therefore, there may be occasional mistakes despite careful proofreading.  ?    Domenick Gong, MD 07/16/23 1258

## 2023-07-16 NOTE — ED Triage Notes (Signed)
Pt presents for STD testing. Declines vaginal testing, would like blood work done.

## 2023-07-17 IMAGING — CR DG FINGER THUMB 2+V*R*
3 series · 3 of 3 positions shown · non-contrast
Comparison: None.

CLINICAL DATA: Right thumb pain, injury

EXAM:
RIGHT THUMB 2+V

[finger ap]
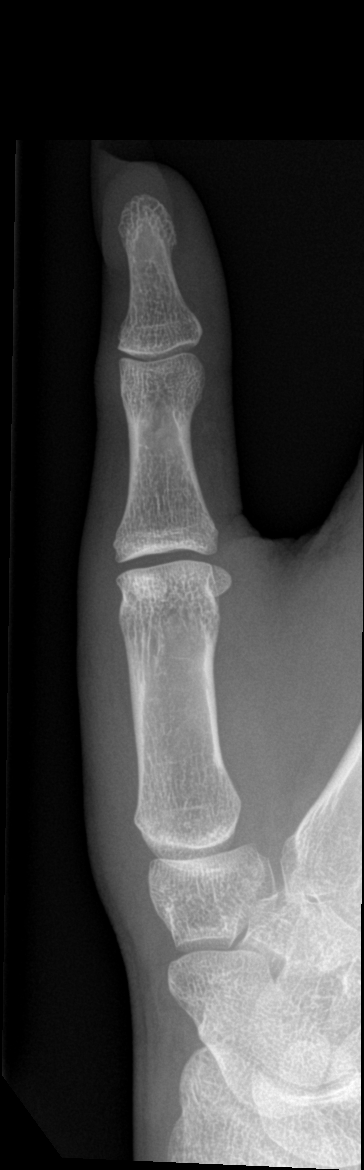

[finger obl]
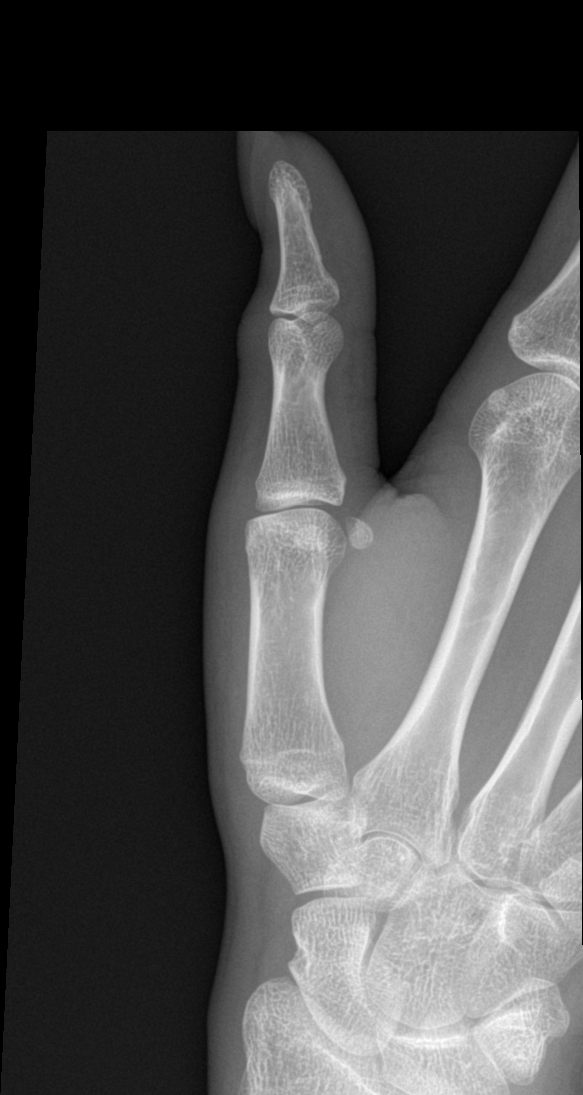

[finger lat]
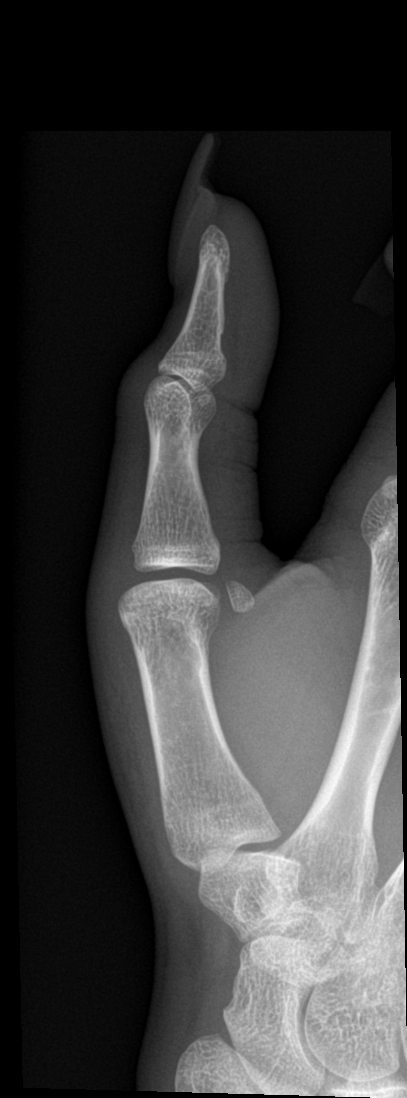

[3 of 3 positions shown; findings below may reference images not displayed]

FINDINGS: There is no evidence of fracture or dislocation. There is no
evidence of arthropathy or other focal bone abnormality. Soft
tissues are unremarkable.
IMPRESSION: Negative.

## 2023-07-18 ENCOUNTER — Telehealth: Payer: Medicaid Other | Admitting: Physician Assistant

## 2023-07-18 DIAGNOSIS — B3731 Acute candidiasis of vulva and vagina: Secondary | ICD-10-CM | POA: Diagnosis not present

## 2023-07-18 MED ORDER — FLUCONAZOLE 150 MG PO TABS
150.0000 mg | ORAL_TABLET | Freq: Every day | ORAL | 0 refills | Status: AC
Start: 2023-07-18 — End: ?

## 2023-07-18 NOTE — Progress Notes (Signed)

## 2023-07-18 NOTE — Progress Notes (Signed)
I have spent 5 minutes in review of e-visit questionnaire, review and updating patient chart, medical decision making and response to patient.   William Cody Martin, PA-C    

## 2023-07-25 ENCOUNTER — Ambulatory Visit
Admission: EM | Admit: 2023-07-25 | Discharge: 2023-07-25 | Disposition: A | Discharge status: Home / Self Care | Payer: Medicaid Other | Attending: Internal Medicine | Admitting: Internal Medicine

## 2023-07-25 DIAGNOSIS — N898 Other specified noninflammatory disorders of vagina: Secondary | ICD-10-CM | POA: Diagnosis present

## 2023-07-25 NOTE — ED Triage Notes (Signed)
Pt c/o vaginal d/c x 4 days-NAD-steady gait

## 2023-07-25 NOTE — Discharge Instructions (Signed)
STD testing pending, this will take 2-3 days to result. We will only call you if your testing is positive for any infection(s) and we will provide treatment.  Avoid sexual intercourse until your STD results come back.  If any of your STD results are positive, you will need to avoid sexual intercourse for 7 days while you are being treated to prevent spread of STD.  Condom use is the best way to prevent spread of STDs.  Return to urgent care as needed.

## 2023-07-25 NOTE — ED Provider Notes (Signed)
[facility]    CSN: [id number] Arrival date & time: 07/25/23  1832      History   Chief Complaint Chief Complaint  Patient presents with   Vaginal Discharge    HPI April Wallace is a 27 y.o. female.   Patient presents to urgent care for evaluation of vaginal discharge that started 4 days ago. Discharge is yellow/white. No vaginal itching or odor, rash, N/V/D, urinary symptoms, fever/chills. No recent new sexual partners, no known exposure to STD. LMP 07/11/2023.  No recent antibiotic/steroid use. Has not attempted treatment of symptoms PTA.    Vaginal Discharge   Past Medical History:  Diagnosis Date   Anemia    Headache     Patient Active Problem List   Diagnosis Date Noted   Anemia affecting pregnancy in first trimester 11/12/2017   Bacterial vaginitis 10/30/2017   Candida vaginitis 10/30/2017   Abdominal cramping affecting pregnancy 10/30/2017   Morning sickness 10/16/2017    Past Surgical History:  Procedure Laterality Date   INDUCED ABORTION     WISDOM TOOTH EXTRACTION      OB History     Gravida  3   Para      Term      Preterm      AB  2   Living         SAB  1   IAB      Ectopic      Multiple      Live Births               Home Medications    Prior to Admission medications   Medication Sig Start Date End Date Taking? Authorizing Provider  ferrous sulfate 325 (65 FE) MG tablet Take 1 tablet (325 mg total) by mouth daily with breakfast for 14 days. 12/06/18 12/20/18  Shaune Pollack, MD  fluconazole (DIFLUCAN) 150 MG tablet Take 1 tablet (150 mg total) by mouth daily. 07/18/23   Waldon Merl, PA-C  medroxyPROGESTERone (DEPO-PROVERA) 150 MG/ML injection Inject into the muscle. 12/04/22 02/04/26  [provider]  metoCLOPramide (REGLAN) 10 MG tablet Take 1 tablet (10 mg total) by mouth every 6 (six) hours as needed for nausea (nausea/headache). 07/12/23   Charlynne Pander, MD  metroNIDAZOLE (FLAGYL)  500 MG tablet Take 1 tablet (500 mg total) by mouth 2 (two) times daily for 7 days. 07/26/23 08/02/23  Radford Pax, NP  norgestimate-ethinyl estradiol (ORTHO-CYCLEN) 0.25-35 MG-MCG tablet Take 1 tablet by mouth daily. 03/19/23   [provider]    Family History Family History  Problem Relation Age of Onset   Diabetes Maternal Grandmother    Seizures Maternal Grandmother    Asthma Maternal Grandmother    Cancer Maternal Grandfather    Healthy Mother    Healthy Father     Social History Social History   Tobacco Use   Smoking status: Never   Smokeless tobacco: Never  Vaping Use   Vaping status: Never Used  Substance Use Topics   Alcohol use: Yes    Comment: occ   Drug use: No     Allergies   Patient has no known allergies.   Review of Systems Review of Systems  Genitourinary:  Positive for vaginal discharge.  Per HPI   Physical Exam Triage Vital Signs ED Triage Vitals  Encounter Vitals Group     BP 07/25/23 1908 114/83     Systolic BP Percentile --      Diastolic BP  Percentile --      Pulse Rate 07/25/23 1908 77     Resp 07/25/23 1908 16     Temp 07/25/23 1908 98.9 F (37.2 C)     Temp Source 07/25/23 1908 Oral     SpO2 07/25/23 1908 98 %     Weight --      Height --      Head Circumference --      Peak Flow --      Pain Score 07/25/23 1907 0     Pain Loc --      Pain Education --      Exclude from Growth Chart --    No data found.  Updated Vital Signs BP 114/83 (BP Location: Right Arm)   Pulse 77   Temp 98.9 F (37.2 C) (Oral)   Resp 16   LMP 07/11/2023 (Approximate)   SpO2 98%   Visual Acuity Right Eye Distance:   Left Eye Distance:   Bilateral Distance:    Right Eye Near:   Left Eye Near:    Bilateral Near:     Physical Exam Vitals and nursing note reviewed.  Constitutional:      Appearance: She is not ill-appearing or toxic-appearing.  HENT:     Head: Normocephalic and atraumatic.     Right Ear: Hearing and external ear  normal.     Left Ear: Hearing and external ear normal.     Nose: Nose normal.     Mouth/Throat:     Lips: Pink.  Eyes:     General: Lids are normal. Vision grossly intact. Gaze aligned appropriately.     Extraocular Movements: Extraocular movements intact.     Conjunctiva/sclera: Conjunctivae normal.  Pulmonary:     Effort: Pulmonary effort is normal.  Genitourinary:    Comments: Deferred.  Musculoskeletal:     Cervical back: Neck supple.  Skin:    General: Skin is warm and dry.     Capillary Refill: Capillary refill takes less than 2 seconds.     Findings: No rash.  Neurological:     General: No focal deficit present.     Mental Status: She is alert and oriented to person, place, and time. Mental status is at baseline.     Cranial Nerves: No dysarthria or facial asymmetry.  Psychiatric:        Mood and Affect: Mood normal.        Speech: Speech normal.        Behavior: Behavior normal.        Thought Content: Thought content normal.        Judgment: Judgment normal.      UC Treatments / Results  Labs (all labs ordered are listed, but only abnormal results are displayed) Labs Reviewed  CERVICOVAGINAL ANCILLARY ONLY - Abnormal; Notable for the following components:      Result Value   Bacterial Vaginitis (gardnerella) Positive (*)    All other components within normal limits    EKG   Radiology No results found.  Procedures Procedures (including critical care time)  Medications Ordered in UC Medications - No data to display  Initial Impression / Assessment and Plan / UC Course  I have reviewed the triage vital signs and the nursing notes.  Pertinent labs & imaging results that were available during my care of the patient were reviewed by me and considered in my medical decision making (see chart for details).   1. Vaginal discharge STI labs pending, will notify  patient of positive results and treat accordingly per protocol when labs result.  Patient  declines HIV and syphilis testing today.   Patient to avoid sexual intercourse until screening testing comes back.   Education provided regarding safe sexual practices and patient encouraged to use protection to prevent spread of STIs.   Counseled patient on potential for adverse effects with medications prescribed/recommended today, strict ER and return-to-clinic precautions discussed, patient verbalized understanding.   Final Clinical Impressions(s) / UC Diagnoses   Final diagnoses:  Vaginal discharge     Discharge Instructions      STD testing pending, this will take 2-3 days to result. We will only call you if your testing is positive for any infection(s) and we will provide treatment.  Avoid sexual intercourse until your STD results come back.  If any of your STD results are positive, you will need to avoid sexual intercourse for 7 days while you are being treated to prevent spread of STD.  Condom use is the best way to prevent spread of STDs.  Return to urgent care as needed.      ED Prescriptions   None    PDMP not reviewed this encounter.   Carlisle Beers, [place] 07/29/23 2203

## 2023-07-26 ENCOUNTER — Telehealth: Payer: Self-pay

## 2023-07-26 MED ORDER — METRONIDAZOLE 500 MG PO TABS: 500.0000 mg | ORAL_TABLET | Freq: Two times a day (BID) | ORAL | 0 refills | Status: AC

## 2023-07-26 NOTE — Telephone Encounter (Signed)
Pt called regarding results of vaginal swab. Pt was informed that we will send in metronidazole to her pharmacy. Pt was understanding.

## 2023-08-13 ENCOUNTER — Ambulatory Visit
Admission: EM | Admit: 2023-08-13 | Discharge: 2023-08-13 | Disposition: A | Discharge status: Home / Self Care | Payer: Medicaid Other | Attending: Internal Medicine | Admitting: Internal Medicine

## 2023-08-13 DIAGNOSIS — J029 Acute pharyngitis, unspecified: Secondary | ICD-10-CM | POA: Diagnosis present

## 2023-08-13 DIAGNOSIS — R070 Pain in throat: Secondary | ICD-10-CM | POA: Insufficient documentation

## 2023-08-13 LAB — POCT RAPID STREP A (OFFICE): Rapid Strep A Screen: NEGATIVE

## 2023-08-13 MED ORDER — IBUPROFEN 600 MG PO TABS
600.0000 mg | ORAL_TABLET | Freq: Four times a day (QID) | ORAL | 0 refills | Status: DC | PRN
Start: 2023-08-13 — End: 2024-01-02

## 2023-08-13 MED ORDER — PSEUDOEPHEDRINE HCL 60 MG PO TABS: 60.0000 mg | ORAL_TABLET | Freq: Three times a day (TID) | ORAL | 0 refills | Status: DC | PRN

## 2023-08-13 MED ORDER — CETIRIZINE HCL 10 MG PO TABS: 10.0000 mg | ORAL_TABLET | Freq: Every day | ORAL | 0 refills | Status: DC

## 2023-08-13 NOTE — ED Triage Notes (Signed)
Pt c/o sore throat x 1 week-denies fever-NAD-steady gait

## 2023-08-13 NOTE — ED Provider Notes (Signed)
[facility]  Note:  This document was prepared using Conservation officer, historic buildings and may include unintentional dictation errors.  MRN: [id number] DOB: [birth year]  Subjective:   April Wallace is a 27 y.o. female presenting for 1 week history of acute onset throat pain, painful swallowing. No fever, runny or stuffy nose, ear pain, cough, chest pain, shob. No rashes. No concerns for an STI.  Patient states she just had testing done and was negative.  Chart review does not show an oral cytology.   No current facility-administered medications for this encounter.  Current Outpatient Medications:    ferrous sulfate 325 (65 FE) MG tablet, Take 1 tablet (325 mg total) by mouth daily with breakfast for 14 days., Disp: 14 tablet, Rfl: 0   fluconazole (DIFLUCAN) 150 MG tablet, Take 1 tablet (150 mg total) by mouth daily., Disp: 1 tablet, Rfl: 0   medroxyPROGESTERone (DEPO-PROVERA) 150 MG/ML injection, Inject into the muscle., Disp: , Rfl:    metoCLOPramide (REGLAN) 10 MG tablet, Take 1 tablet (10 mg total) by mouth every 6 (six) hours as needed for nausea (nausea/headache)., Disp: 10 tablet, Rfl: 0   norgestimate-ethinyl estradiol (ORTHO-CYCLEN) 0.25-35 MG-MCG tablet, Take 1 tablet by mouth daily., Disp: , Rfl:    No Known Allergies  Past Medical History:  Diagnosis Date   Anemia    Headache      Past Surgical History:  Procedure Laterality Date   INDUCED ABORTION     WISDOM TOOTH EXTRACTION      Family History  Problem Relation Age of Onset   Diabetes Maternal Grandmother    Seizures Maternal Grandmother    Asthma Maternal Grandmother    Cancer Maternal Grandfather    Healthy Mother    Healthy Father     Social History   Tobacco Use   Smoking status: Never   Smokeless tobacco: Never  Vaping Use   Vaping status: Never Used  Substance Use Topics   Alcohol use: Yes    Comment: occ   Drug use: No    ROS   Objective:   Vitals: BP  104/70 (BP Location: Right Arm)   Pulse 80   Temp 99.5 F (37.5 C) (Oral)   Resp 16   LMP 07/11/2023 (Approximate)   SpO2 96%   Physical Exam Constitutional:      General: She is not in acute distress.    Appearance: Normal appearance. She is well-developed. She is not ill-appearing, toxic-appearing or diaphoretic.  HENT:     Head: Normocephalic and atraumatic.     Nose: Nose normal.     Mouth/Throat:     Mouth: Mucous membranes are moist.     Pharynx: Posterior oropharyngeal erythema (thick wall of post-nasal drainage overlying pharynx with associated erythema) present. No pharyngeal swelling, oropharyngeal exudate or uvula swelling.     Tonsils: No tonsillar exudate or tonsillar abscesses. 0 on the right. 0 on the left.  Eyes:     General: No scleral icterus.       Right eye: No discharge.        Left eye: No discharge.     Extraocular Movements: Extraocular movements intact.  Cardiovascular:     Rate and Rhythm: Normal rate.  Pulmonary:     Effort: Pulmonary effort is normal.  Skin:    General: Skin is warm and dry.  Neurological:     General: No focal deficit present.     Mental Status: She is alert and oriented  to person, place, and time.  Psychiatric:        Mood and Affect: Mood normal.        Behavior: Behavior normal.     Results for orders placed or performed during the hospital encounter of 08/13/23 (from the past 24 hour(s))  POCT rapid strep A     Status: None   Collection Time: 08/13/23  5:05 PM  Result Value Ref Range   Rapid Strep A Screen Negative Negative    Assessment and Plan :   PDMP not reviewed this encounter.  1. Acute pharyngitis, unspecified etiology   2. Throat pain    Throat culture pending, recommend supportive care for now.  Patient politely declines STI testing.  If patient remains symptomatic, by my next shift on Friday, or through the weekend can call to request an antibiotic to address an acute bacterial pharyngitis.  Counseled  patient on potential for adverse effects with medications prescribed/recommended today, ER and return-to-clinic precautions discussed, patient verbalized understanding.    Wallis Bamberg, [place] 08/13/23 [id number]

## 2023-08-13 NOTE — Discharge Instructions (Signed)
We will manage this as a viral pharyngitis, a virus throat infection. For sore throat or cough try using a honey-based tea. Use 3 teaspoons of honey with juice squeezed from half lemon. Place shaved pieces of ginger into 1/2-1 cup of water and warm over stove top. Then mix the ingredients and repeat every 4 hours as needed. Please take ibuprofen 600mg  every 6 hours with food alternating with OR taken together with Tylenol 500mg -650mg  every 6 hours for throat pain, fevers, aches and pains. Hydrate very well with at least 2 liters of water. Eat light meals such as soups (chicken and noodles, vegetable, chicken and wild rice).  Do not eat foods that you are allergic to.  Taking an antihistamine like Zyrtec (10mg  daily) can help against postnasal drainage, sinus congestion which can cause sinus pain, sinus headaches, throat pain, painful swallowing, coughing.  You can take this together with pseudoephedrine (Sudafed) at a dose of 60 mg 3 times a day or twice daily as needed for the same kind of nasal drip, congestion.    If you continue to have symptoms Friday, call the clinic for me and I will send in a prescription for an antibiotic course to address a bacterial throat infection.

## 2023-08-16 LAB — CULTURE, GROUP A STREP (THRC)

## 2023-09-19 ENCOUNTER — Encounter (HOSPITAL_COMMUNITY): Payer: Self-pay

## 2023-09-19 ENCOUNTER — Emergency Department (HOSPITAL_COMMUNITY)
Admission: EM | Admit: 2023-09-19 | Discharge: 2023-09-19 | Disposition: A | Discharge status: Home / Self Care | Payer: Medicaid Other

## 2023-09-19 ENCOUNTER — Telehealth: Payer: Medicaid Other | Admitting: Physician Assistant

## 2023-09-19 DIAGNOSIS — B3731 Acute candidiasis of vulva and vagina: Secondary | ICD-10-CM

## 2023-09-19 DIAGNOSIS — B379 Candidiasis, unspecified: Secondary | ICD-10-CM

## 2023-09-19 DIAGNOSIS — L292 Pruritus vulvae: Secondary | ICD-10-CM | POA: Diagnosis present

## 2023-09-19 LAB — URINALYSIS, ROUTINE W REFLEX MICROSCOPIC
Bilirubin Urine: NEGATIVE
Glucose, UA: NEGATIVE mg/dL
Hgb urine dipstick: NEGATIVE
Ketones, ur: NEGATIVE mg/dL
Nitrite: NEGATIVE
Protein, ur: 30 mg/dL — AB
Specific Gravity, Urine: 1.026 (ref 1.005–1.030)
pH: 6 (ref 5.0–8.0)

## 2023-09-19 LAB — PREGNANCY, URINE: Preg Test, Ur: NEGATIVE

## 2023-09-19 LAB — WET PREP, GENITAL
Clue Cells Wet Prep HPF POC: NONE SEEN
Sperm: NONE SEEN
Trich, Wet Prep: NONE SEEN
WBC, Wet Prep HPF POC: <10 (ref <10)
Yeast Wet Prep HPF POC: NONE SEEN

## 2023-09-19 MED ORDER — FLUCONAZOLE 150 MG PO TABS
150.0000 mg | ORAL_TABLET | Freq: Once | ORAL | 0 refills | Status: AC
Start: 2023-09-19 — End: 2023-09-19

## 2023-09-19 MED ORDER — FLUCONAZOLE 150 MG PO TABS
150.0000 mg | ORAL_TABLET | Freq: Once | ORAL | Status: AC
  Administered 2023-09-19: 150 mg via ORAL
  Filled 2023-09-19: qty 1

## 2023-09-19 NOTE — Discharge Instructions (Addendum)
As discussed, we treated you in the ED today for a yeast infection with a one time medication.   Follow-up with your primary care provider in the next 5 to 7 days for reevaluation of your symptoms.  Return to the ED if your symptoms worsen in the interim.

## 2023-09-19 NOTE — Progress Notes (Signed)

## 2023-09-19 NOTE — ED Provider Notes (Signed)
[place] EMERGENCY DEPARTMENT AT [facility] Provider Note   CSN: [id number] Arrival date & time: 09/19/23  1954     History  Chief Complaint  Patient presents with   Vaginal Itching    April Wallace is a 27 y.o. female with a history of of headache and anemia presents the ED today for vaginal itching.  Patient reports yesterday she had vaginal discharge that was white in color.  She took OTC Monistat which helped with the discharge and itching.  Today, patient denies vaginal odor, pain, bleeding, dyspareunia, or urinary symptoms.   Patient states that she had an on line visit and they diagnosed her with a yeast infection at that time but she wanted to have a test in person to see if she tested positive.  Denies any abdominal pain, back pain, or fever.  No other complaints or concerns at this time.    Home Medications Prior to Admission medications   Medication Sig Start Date End Date Taking? Authorizing Provider  cetirizine (ZYRTEC ALLERGY) 10 MG tablet Take 1 tablet (10 mg total) by mouth daily. 08/13/23   Wallis Bamberg, PA-C  ferrous sulfate 325 (65 FE) MG tablet Take 1 tablet (325 mg total) by mouth daily with breakfast for 14 days. 12/06/18 12/20/18  Shaune Pollack, MD  fluconazole (DIFLUCAN) 150 MG tablet Take 1 tablet (150 mg total) by mouth once for 1 dose. 09/19/23 09/19/23  Waldon Merl, PA-C  ibuprofen (ADVIL) 600 MG tablet Take 1 tablet (600 mg total) by mouth every 6 (six) hours as needed. 08/13/23   Wallis Bamberg, PA-C  medroxyPROGESTERone (DEPO-PROVERA) 150 MG/ML injection Inject into the muscle. 12/04/22 02/04/26  [provider]  metoCLOPramide (REGLAN) 10 MG tablet Take 1 tablet (10 mg total) by mouth every 6 (six) hours as needed for nausea (nausea/headache). 07/12/23   Charlynne Pander, MD  norgestimate-ethinyl estradiol (ORTHO-CYCLEN) 0.25-35 MG-MCG tablet Take 1 tablet by mouth daily. 03/19/23   [provider]  pseudoephedrine  (SUDAFED) 60 MG tablet Take 1 tablet (60 mg total) by mouth every 8 (eight) hours as needed for congestion. 08/13/23   Wallis Bamberg, PA-C      Allergies    Patient has no known allergies.    Review of Systems   Review of Systems  Genitourinary:        Vaginal itching  All other systems reviewed and are negative.   Physical Exam Updated Vital Signs BP 116/87 (BP Location: Left Arm)   Pulse 89   Temp 98.7 F (37.1 C) (Oral)   Resp 17   Ht 5\' 4"  (1.626 m)   Wt 50.3 kg   LMP 07/10/2023   SpO2 100%   BMI 19.05 kg/m  Physical Exam Vitals and nursing note reviewed.  Constitutional:      General: She is not in acute distress.    Appearance: Normal appearance.  HENT:     Head: Normocephalic and atraumatic.     Mouth/Throat:     Mouth: Mucous membranes are moist.  Eyes:     Conjunctiva/sclera: Conjunctivae normal.     Pupils: Pupils are equal, round, and reactive to light.  Cardiovascular:     Rate and Rhythm: Normal rate and regular rhythm.     Pulses: Normal pulses.     Heart sounds: Normal heart sounds.  Pulmonary:     Effort: Pulmonary effort is normal.     Breath sounds: Normal breath sounds.  Abdominal:     Palpations: Abdomen  is soft.     Tenderness: There is no abdominal tenderness.  Skin:    General: Skin is warm and dry.     Findings: No rash.  Neurological:     General: No focal deficit present.     Mental Status: She is alert.  Psychiatric:        Mood and Affect: Mood normal.        Behavior: Behavior normal.     ED Results / Procedures / Treatments   Labs (all labs ordered are listed, but only abnormal results are displayed) Labs Reviewed  URINALYSIS, ROUTINE W REFLEX MICROSCOPIC - Abnormal; Notable for the following components:      Result Value   APPearance HAZY (*)    Protein, ur 30 (*)    Leukocytes,Ua SMALL (*)    Bacteria, UA RARE (*)    All other components within normal limits  WET PREP, GENITAL  PREGNANCY, URINE     EKG None  Radiology No results found.  Procedures Procedures: not indicated.   Medications Ordered in ED Medications  fluconazole (DIFLUCAN) tablet 150 mg (has no administration in time range)    ED Course/ Medical Decision Making/ A&P                                 Medical Decision Making Amount and/or Complexity of Data Reviewed Labs: ordered.   This patient presents to the ED for concern of vaginal itching, this involves an extensive number of treatment options, and is a complaint that carries with it a high risk of complications and morbidity.   Differential diagnosis includes: yeast infection, UTI, etc.   Comorbidities  See HPI above   Additional History  Additional history obtained from previous records.   Lab Tests  I ordered and personally interpreted labs.  The pertinent results include:   Negative pregnancy test UA shows 11-20 squamous epithelial cells - dirty catch Wet prep is negative - she did take Monostat yesterday but still presents with yeast infection-like symptoms   Problem List / ED Course / Critical Interventions / Medication Management  Vaginal itching I ordered medications including: Diflucan for critical presentation of vaginal candidiasis. Given prior to discharge. I have reviewed the patients home medicines and have made adjustments as needed   Social Determinants of Health  Social connection   Test / Admission - Considered  Discussed results with patient.  All questions were answered. She is hemodynamically stable and safe for discharge home. Return precautions provided.       Final Clinical Impression(s) / ED Diagnoses Final diagnoses:  Yeast infection    Rx / DC Orders ED Discharge Orders     None         Maxwell Marion, PA-C 09/19/23 2329    Durwin Glaze, MD 09/20/23 0010

## 2023-09-19 NOTE — ED Triage Notes (Signed)
Pt arrived POV c/o vaginal itching that started yesterday with vaginal discharge, white in color. Pt took OTC Monistat, which has helped the discharge, but not the itching. Denies vaginal odor, urinary issues, or vaginal bleeding. Denies abd pain, no n/v/d. VSS, NAD noted.

## 2023-09-19 NOTE — Progress Notes (Signed)
I have spent 5 minutes in review of e-visit questionnaire, review and updating patient chart, medical decision making and response to patient.   Mia Milan Cody Jacklynn Dehaas, PA-C    

## 2023-09-29 ENCOUNTER — Ambulatory Visit
Admission: EM | Admit: 2023-09-29 | Discharge: 2023-09-29 | Disposition: A | Discharge status: Home / Self Care | Payer: Medicaid Other | Attending: Internal Medicine | Admitting: Internal Medicine

## 2023-09-29 DIAGNOSIS — B9689 Other specified bacterial agents as the cause of diseases classified elsewhere: Secondary | ICD-10-CM

## 2023-09-29 DIAGNOSIS — N760 Acute vaginitis: Secondary | ICD-10-CM | POA: Diagnosis present

## 2023-09-29 MED ORDER — FLUCONAZOLE 150 MG PO TABS: 150.0000 mg | ORAL_TABLET | ORAL | 0 refills | Status: DC

## 2023-09-29 MED ORDER — METRONIDAZOLE 500 MG PO TABS: 500.0000 mg | ORAL_TABLET | Freq: Two times a day (BID) | ORAL | 0 refills | Status: DC

## 2023-09-29 NOTE — Discharge Instructions (Addendum)
Please go ahead and restart fluconazole. Since you have had difficulty with recurring infections, I will prescribe a total of 5 doses, taking one tablet every 3 days. Metronidazole is for BV infection. Take twice daily with food and avoid alcohol use.

## 2023-09-29 NOTE — ED Provider Notes (Signed)
[facility]  Note:  This document was prepared using Conservation officer, historic buildings and may include unintentional dictation errors.  MRN: [id number] DOB: [birth year]  Subjective:   April Wallace is a 27 y.o. female presenting for 1 week history of persistent vaginal itching.  Patient has concerns about a recurrent yeast infection.  She has had a history of bacterial vaginosis as well.  Would like to be covered for both.  Denies fever, n/v, abdominal pain, pelvic pain, rashes, dysuria, urinary frequency, hematuria.    No current facility-administered medications for this encounter.  Current Outpatient Medications:    cetirizine (ZYRTEC ALLERGY) 10 MG tablet, Take 1 tablet (10 mg total) by mouth daily., Disp: 30 tablet, Rfl: 0   ferrous sulfate 325 (65 FE) MG tablet, Take 1 tablet (325 mg total) by mouth daily with breakfast for 14 days., Disp: 14 tablet, Rfl: 0   ibuprofen (ADVIL) 600 MG tablet, Take 1 tablet (600 mg total) by mouth every 6 (six) hours as needed., Disp: 30 tablet, Rfl: 0   medroxyPROGESTERone (DEPO-PROVERA) 150 MG/ML injection, Inject into the muscle., Disp: , Rfl:    metoCLOPramide (REGLAN) 10 MG tablet, Take 1 tablet (10 mg total) by mouth every 6 (six) hours as needed for nausea (nausea/headache)., Disp: 10 tablet, Rfl: 0   norgestimate-ethinyl estradiol (ORTHO-CYCLEN) 0.25-35 MG-MCG tablet, Take 1 tablet by mouth daily., Disp: , Rfl:    pseudoephedrine (SUDAFED) 60 MG tablet, Take 1 tablet (60 mg total) by mouth every 8 (eight) hours as needed for congestion., Disp: 30 tablet, Rfl: 0   No Known Allergies  Past Medical History:  Diagnosis Date   Anemia    Headache      Past Surgical History:  Procedure Laterality Date   INDUCED ABORTION     WISDOM TOOTH EXTRACTION      Family History  Problem Relation Age of Onset   Diabetes Maternal Grandmother    Seizures Maternal Grandmother    Asthma Maternal Grandmother    Cancer  Maternal Grandfather    Healthy Mother    Healthy Father     Social History   Tobacco Use   Smoking status: Never   Smokeless tobacco: Never  Vaping Use   Vaping status: Never Used  Substance Use Topics   Alcohol use: Yes    Comment: occ   Drug use: No    ROS   Objective:   Vitals: BP 116/83 (BP Location: Right Arm)   Pulse 91   Temp 98.2 F (36.8 C) (Oral)   Resp 14   LMP  (Within Months) Comment: Sattes last menstraul period was July 2024, negative pregnancy test 1 week ago at teh ED.  SpO2 97%   Physical Exam Constitutional:      General: She is not in acute distress.    Appearance: Normal appearance. She is well-developed. She is not ill-appearing, toxic-appearing or diaphoretic.  HENT:     Head: Normocephalic and atraumatic.     Nose: Nose normal.     Mouth/Throat:     Mouth: Mucous membranes are moist.     Pharynx: Oropharynx is clear.  Eyes:     General: No scleral icterus.       Right eye: No discharge.        Left eye: No discharge.     Extraocular Movements: Extraocular movements intact.     Conjunctiva/sclera: Conjunctivae normal.  Cardiovascular:     Rate and Rhythm: Normal rate.  Pulmonary:  Effort: Pulmonary effort is normal.  Abdominal:     General: Bowel sounds are normal. There is no distension.     Palpations: Abdomen is soft. There is no mass.     Tenderness: There is no abdominal tenderness. There is no right CVA tenderness, left CVA tenderness, guarding or rebound.  Skin:    General: Skin is warm and dry.  Neurological:     General: No focal deficit present.     Mental Status: She is alert and oriented to person, place, and time.  Psychiatric:        Mood and Affect: Mood normal.        Behavior: Behavior normal.        Thought Content: Thought content normal.        Judgment: Judgment normal.     Assessment and Plan :   PDMP not reviewed this encounter.  1. Bacterial vaginosis   2. Acute vaginitis    We will treat  patient empirically for bacterial vaginosis with Flagyl and for yeast vaginitis with fluconazole.  Labs pending. Counseled patient on potential for adverse effects with medications prescribed/recommended today, ER and return-to-clinic precautions discussed, patient verbalized understanding.    Wallis Bamberg, PA-C 10/02/23 1118

## 2023-09-29 NOTE — ED Triage Notes (Signed)
Pt reports vaginal itching x 1 week. States Monistat gave some relief with the vaginal itching, pt used it 1 week ago. Pt took fluconazole prescribed at the ED fro same complaint 1 week ago.

## 2023-09-30 LAB — CERVICOVAGINAL ANCILLARY ONLY
Bacterial Vaginitis (gardnerella): POSITIVE — AB
Candida Glabrata: NEGATIVE
Candida Vaginitis: NEGATIVE
Chlamydia: NEGATIVE
Comment: NEGATIVE
Comment: NEGATIVE
Comment: NEGATIVE
Comment: NEGATIVE
Comment: NEGATIVE
Comment: NORMAL
Neisseria Gonorrhea: NEGATIVE
Trichomonas: NEGATIVE

## 2023-10-14 ENCOUNTER — Ambulatory Visit
Admission: EM | Admit: 2023-10-14 | Discharge: 2023-10-14 | Disposition: A | Discharge status: Home / Self Care | Payer: Medicaid Other | Attending: Emergency Medicine | Admitting: Emergency Medicine

## 2023-10-14 ENCOUNTER — Telehealth: Payer: Medicaid Other | Admitting: Family Medicine

## 2023-10-14 ENCOUNTER — Telehealth: Payer: Self-pay

## 2023-10-14 DIAGNOSIS — N760 Acute vaginitis: Secondary | ICD-10-CM | POA: Diagnosis not present

## 2023-10-14 DIAGNOSIS — N898 Other specified noninflammatory disorders of vagina: Secondary | ICD-10-CM

## 2023-10-14 MED ORDER — CLEOCIN 100 MG VA SUPP: 100.0000 mg | Freq: Every day | VAGINAL | 0 refills | Status: DC

## 2023-10-14 NOTE — Discharge Instructions (Signed)
Use medication as prescribed for likely BV that was not treated with recent Flagyl. We are sending swab to the lab and will contact you with results. Follow-up with gyn if persistent symptoms or concerns.

## 2023-10-14 NOTE — Telephone Encounter (Signed)
Pharmacy change

## 2023-10-14 NOTE — ED Triage Notes (Signed)
Pt presents with c/o vaginal itching x 1.5 wks. Pt states she had a yeast infection and was treated, states she had intercourse the day after she completed her treatment.

## 2023-10-14 NOTE — Progress Notes (Signed)
Because you had treatment that failed your condition warrants further evaluation and I recommend that you be seen in a face to face visit.   NOTE: There will be NO CHARGE for this eVisit   If you are having a true medical emergency please call 911.

## 2023-10-14 NOTE — ED Provider Notes (Signed)
[facility]    CSN: [id number] Arrival date & time: 10/14/23  1514      History   Chief Complaint Chief Complaint  Patient presents with   Vaginal Itching    HPI April Wallace is a 27 y.o. female.   Patient presents with concerns of vaginal itching and white discharge. The patient was seen on 10/13 and prescribed Flagyl for BV. She states she had also been given medicine for yeast that she didn't originally take. The swab results came back positive only for BV. She states her symptoms seemed to be improving and reports she completed the course of Flagyl. The patient reports the day after completing it, she had intercourse. Shortly after that she states her symptoms returned. She reports itching, white milky discharge. She denies dysuria, vaginal odor, abdominal or back pain. The patient states she took the fluconazole she had previously been prescribed without improvement so she thinks the BV is back or never really went away.   The history is provided by the patient.  Vaginal Itching Pertinent negatives include no abdominal pain.    Past Medical History:  Diagnosis Date   Anemia    Headache     Patient Active Problem List   Diagnosis Date Noted   Anemia affecting pregnancy in first trimester 11/12/2017   Bacterial vaginitis 10/30/2017   Candida vaginitis 10/30/2017   Abdominal cramping affecting pregnancy 10/30/2017   Morning sickness 10/16/2017    Past Surgical History:  Procedure Laterality Date   INDUCED ABORTION     WISDOM TOOTH EXTRACTION      OB History     Gravida  3   Para      Term      Preterm      AB  2   Living         SAB  1   IAB      Ectopic      Multiple      Live Births               Home Medications    Prior to Admission medications   Medication Sig Start Date End Date Taking? Authorizing Provider  clindamycin (CLEOCIN) 100 MG vaginal suppository Place 1 suppository (100 mg total) vaginally at  bedtime for 3 days. 10/14/23 10/17/23 Yes Rakeisha Nyce L, PA  cetirizine (ZYRTEC ALLERGY) 10 MG tablet Take 1 tablet (10 mg total) by mouth daily. 08/13/23   Wallis Bamberg, PA-C  ferrous sulfate 325 (65 FE) MG tablet Take 1 tablet (325 mg total) by mouth daily with breakfast for 14 days. 12/06/18 12/20/18  Shaune Pollack, MD  fluconazole (DIFLUCAN) 150 MG tablet Take 1 tablet (150 mg total) by mouth every 3 (three) days. 09/29/23   Wallis Bamberg, PA-C  ibuprofen (ADVIL) 600 MG tablet Take 1 tablet (600 mg total) by mouth every 6 (six) hours as needed. 08/13/23   Wallis Bamberg, PA-C  medroxyPROGESTERone (DEPO-PROVERA) 150 MG/ML injection Inject into the muscle. 12/04/22 02/04/26  [provider]  metoCLOPramide (REGLAN) 10 MG tablet Take 1 tablet (10 mg total) by mouth every 6 (six) hours as needed for nausea (nausea/headache). 07/12/23   Charlynne Pander, MD  metroNIDAZOLE (FLAGYL) 500 MG tablet Take 1 tablet (500 mg total) by mouth 2 (two) times daily. 09/29/23   Wallis Bamberg, PA-C  norgestimate-ethinyl estradiol (ORTHO-CYCLEN) 0.25-35 MG-MCG tablet Take 1 tablet by mouth daily. 03/19/23   [provider]  pseudoephedrine (SUDAFED) 60 MG tablet Take 1  tablet (60 mg total) by mouth every 8 (eight) hours as needed for congestion. 08/13/23   Wallis Bamberg, PA-C    Family History Family History  Problem Relation Age of Onset   Diabetes Maternal Grandmother    Seizures Maternal Grandmother    Asthma Maternal Grandmother    Cancer Maternal Grandfather    Healthy Mother    Healthy Father     Social History Social History   Tobacco Use   Smoking status: Never   Smokeless tobacco: Never  Vaping Use   Vaping status: Never Used  Substance Use Topics   Alcohol use: Yes    Comment: occ   Drug use: No     Allergies   Patient has no known allergies.   Review of Systems Review of Systems  Constitutional:  Negative for fever.  Gastrointestinal:  Negative for abdominal pain, nausea and  vomiting.  Genitourinary:  Positive for vaginal discharge. Negative for dysuria, frequency, genital sores and vaginal pain.  Musculoskeletal:  Negative for back pain.  Skin:  Negative for rash.     Physical Exam Triage Vital Signs ED Triage Vitals  Encounter Vitals Group     BP 10/14/23 1553 109/77     Systolic BP Percentile --      Diastolic BP Percentile --      Pulse Rate 10/14/23 1553 90     Resp 10/14/23 1553 17     Temp 10/14/23 1553 98.1 F (36.7 C)     Temp Source 10/14/23 1553 Oral     SpO2 10/14/23 1553 98 %     Weight --      Height --      Head Circumference --      Peak Flow --      Pain Score 10/14/23 1552 0     Pain Loc --      Pain Education --      Exclude from Growth Chart --    No data found.  Updated Vital Signs BP 109/77 (BP Location: Right Arm)   Pulse 90   Temp 98.1 F (36.7 C) (Oral)   Resp 17   LMP 07/10/2023 Comment: Was on contraceptives and then got off of it  SpO2 98%   Visual Acuity Right Eye Distance:   Left Eye Distance:   Bilateral Distance:    Right Eye Near:   Left Eye Near:    Bilateral Near:     Physical Exam Vitals and nursing note reviewed.  Constitutional:      General: She is not in acute distress. Cardiovascular:     Rate and Rhythm: Normal rate and regular rhythm.     Heart sounds: Normal heart sounds.  Pulmonary:     Effort: Pulmonary effort is normal.     Breath sounds: Normal breath sounds.  Abdominal:     Palpations: Abdomen is soft.     Tenderness: There is no abdominal tenderness. There is no right CVA tenderness or left CVA tenderness.  Genitourinary:    Comments: Pt deferred Neurological:     Mental Status: She is alert.  Psychiatric:        Mood and Affect: Mood normal.      UC Treatments / Results  Labs (all labs ordered are listed, but only abnormal results are displayed) Labs Reviewed  CERVICOVAGINAL ANCILLARY ONLY    EKG   Radiology No results found.  Procedures Procedures  (including critical care time)  Medications Ordered in UC Medications - No data to  display  Initial Impression / Assessment and Plan / UC Course  I have reviewed the triage vital signs and the nursing notes.  Pertinent labs & imaging results that were available during my care of the patient were reviewed by me and considered in my medical decision making (see chart for details).     Sx consistent with persistent/recurrent BV after recent treatment with Flagyl, other ddx includes candida. She took Diflucan without improvement, will send in BV treatment (clindamycin suppository as alternative to repeat Flagyl) while awaiting lab results. Encouraged pt to follow-up with gyn for further evaluation - she states she has an appointment next week.   E/M: 1 acute uncomplicated illness, 1 data, moderate risk due to prescription management   Final Clinical Impressions(s) / UC Diagnoses   Final diagnoses:  Acute vaginitis     Discharge Instructions      Use medication as prescribed for likely BV that was not treated with recent Flagyl. We are sending swab to the lab and will contact you with results. Follow-up with gyn if persistent symptoms or concerns.     ED Prescriptions     Medication Sig Dispense Auth. Provider   clindamycin (CLEOCIN) 100 MG vaginal suppository Place 1 suppository (100 mg total) vaginally at bedtime for 3 days. 3 suppository Vallery Sa, Jamaris Theard L, PA      PDMP not reviewed this encounter.   Estanislado Pandy, [place] 10/14/23 1627

## 2023-10-15 ENCOUNTER — Telehealth: Payer: Self-pay

## 2023-10-15 ENCOUNTER — Other Ambulatory Visit: Payer: Self-pay

## 2023-10-15 ENCOUNTER — Other Ambulatory Visit (HOSPITAL_COMMUNITY): Payer: Self-pay

## 2023-10-15 LAB — CERVICOVAGINAL ANCILLARY ONLY
Bacterial Vaginitis (gardnerella): NEGATIVE
Candida Glabrata: NEGATIVE
Candida Vaginitis: NEGATIVE
Chlamydia: NEGATIVE
Comment: NEGATIVE
Comment: NEGATIVE
Comment: NEGATIVE
Comment: NEGATIVE
Comment: NEGATIVE
Comment: NORMAL
Neisseria Gonorrhea: NEGATIVE
Trichomonas: NEGATIVE

## 2023-10-15 MED ORDER — CLEOCIN 100 MG VA SUPP
100.0000 mg | Freq: Every day | VAGINAL | 0 refills | Status: AC
Start: 2023-10-15 — End: 2023-10-18
  Filled 2023-10-15: qty 3, 3d supply, fill #0

## 2023-10-15 NOTE — Telephone Encounter (Signed)
Pharmacy change

## 2023-10-22 ENCOUNTER — Other Ambulatory Visit (HOSPITAL_COMMUNITY)
Admission: RE | Admit: 2023-10-22 | Discharge: 2023-10-22 | Disposition: A | Discharge status: Home / Self Care | Payer: Medicaid Other | Source: Ambulatory Visit | Attending: Obstetrics and Gynecology | Admitting: Obstetrics and Gynecology

## 2023-10-22 ENCOUNTER — Encounter: Payer: Self-pay | Admitting: Obstetrics and Gynecology

## 2023-10-22 ENCOUNTER — Ambulatory Visit: Payer: Medicaid Other | Admitting: Obstetrics and Gynecology

## 2023-10-22 VITALS — BP 102/66 | HR 85 | Ht 64.0 in | Wt 114.0 lb

## 2023-10-22 DIAGNOSIS — Z01419 Encounter for gynecological examination (general) (routine) without abnormal findings: Secondary | ICD-10-CM | POA: Diagnosis not present

## 2023-10-22 DIAGNOSIS — Z1339 Encounter for screening examination for other mental health and behavioral disorders: Secondary | ICD-10-CM | POA: Diagnosis not present

## 2023-10-22 MED ORDER — PHEXXI 1.8-1-0.4 % VA GEL: VAGINAL | 11 refills | Status: DC

## 2023-10-22 MED ORDER — ETONOGESTREL-ETHINYL ESTRADIOL 0.12-0.015 MG/24HR VA RING: VAGINAL_RING | VAGINAL | 12 refills | Status: DC

## 2023-10-22 NOTE — Progress Notes (Signed)
27 y.o. New GYN presents for AEX/PAP.  Pt was using DEPO for [facility], she stopped in September.  Her PCP wants her to get an [place]

## 2023-10-22 NOTE — Progress Notes (Signed)
Subjective:     April Wallace is a 27 y.o. female P49 with LMP 06/2023 and BMI 19 who is here for a comprehensive physical exam. The patient reports being on depo-provera and experiencing irregular vaginal bleeding. She was last due for her dose in September and opted not to get it. She desires to discuss other options. She is sexually active using condoms for contraception. She has not had a period for several months and desires a method that will keep her cycles light. Patient denies pelvic pain or abnormal discharge. She is without any other complaints.  Past Medical History:  Diagnosis Date   Anemia    Headache    Past Surgical History:  Procedure Laterality Date   INDUCED ABORTION     WISDOM TOOTH EXTRACTION     Family History  Problem Relation Age of Onset   Diabetes Maternal Grandmother    Seizures Maternal Grandmother    Asthma Maternal Grandmother    Cancer Maternal Grandfather    Healthy Mother    Healthy Father     Social History   Socioeconomic History   Marital status: Single    Spouse name: Not on file   Number of children: Not on file   Years of education: Not on file   Highest education level: Not on file  Occupational History   Not on file  Tobacco Use   Smoking status: Never   Smokeless tobacco: Never  Vaping Use   Vaping status: Never Used  Substance and Sexual Activity   Alcohol use: Yes    Comment: occ   Drug use: No   Sexual activity: Yes    Birth control/protection: None  Other Topics Concern   Not on file  Social History Narrative   Not on file   Social Determinants of Health   Financial Resource Strain: Not on file  Food Insecurity: Low Risk  (05/22/2023)   [facility]   Hunger Vital Sign    Worried About Running Out of Food in the Last Year: Never true    Ran Out of Food in the Last Year: Never true  Transportation Needs: No Transportation Needs (05/22/2023)   [facility]   Transportation    In the past 12 months, has lack of reliable transportation kept you from medical appointments, meetings, work or from getting things needed for daily living? : No  Physical Activity: Not on file  Stress: Not on file  Social Connections: Not on file  Intimate Partner Violence: Not on file   Health Maintenance  Topic Date Due   HPV VACCINES (1 - 3-dose series) Never done   Hepatitis C Screening  Never done   DTaP/Tdap/Td (1 - Tdap) Never done   Cervical Cancer Screening (Pap smear)  Never done   INFLUENZA VACCINE  Never done   COVID-19 Vaccine (3 - 2023-24 season) 08/18/2023   HIV Screening  Completed       Review of Systems Pertinent items noted in HPI and remainder of comprehensive ROS otherwise negative.   Objective:  Blood pressure 102/66, pulse 85, height 5\' 4"  (1.626 m), weight 114 lb (51.7 kg), last menstrual period 07/10/2023.   GENERAL: Well-developed, well-nourished female in no acute distress.  HEENT: Normocephalic, atraumatic. Sclerae anicteric.  NECK: Supple. Normal thyroid.  LUNGS: Clear to auscultation bilaterally.  HEART: Regular rate and rhythm. BREASTS: Symmetric in size. No palpable masses or lymphadenopathy, skin changes, or nipple drainage. ABDOMEN: Soft,  nontender, nondistended. No organomegaly. PELVIC: Normal external female genitalia. Vagina is pink and rugated.  Normal discharge. Normal appearing cervix. Uterus is normal in size. No adnexal mass or tenderness. Chaperone present during the pelvic exam EXTREMITIES: No cyanosis, clubbing, or edema, 2+ distal pulses.     Assessment:    Healthy female exam.      Plan:    Pap smear collected Patient will be contacted with abnormal results Reviewed contraception options with the patient- patient opted for nuvaring and phexxi Patient desires pelvic ultrasound to rule out fibroids as her 73 yo sister has had fibroids Patient declined STI screening See After Visit Summary for  Counseling Recommendations

## 2023-10-23 ENCOUNTER — Telehealth: Payer: Self-pay | Admitting: *Deleted

## 2023-10-23 NOTE — Telephone Encounter (Signed)
TC from pt reporting RX for Nuvaring and Phexxi not available when she went to the pharmacy. RN confirmed that e prescribe "receipt confirmed by pharmacy" message was received for both RXs. RTC to pt. Pt now stating Nuvaring is available. Encouraged pt to ask more specific questions about Phexxi to determine if it is just not in stock or not able to be ordered at her regular pharmacy. Advised pt to call the office for problems with filling/ getting Phexxi. Pt verbalized understanding.

## 2023-10-25 ENCOUNTER — Ambulatory Visit (HOSPITAL_COMMUNITY): Payer: Medicaid Other

## 2023-10-26 ENCOUNTER — Ambulatory Visit (HOSPITAL_COMMUNITY)
Admission: EM | Admit: 2023-10-26 | Discharge: 2023-10-26 | Disposition: A | Discharge status: Home / Self Care | Payer: Medicaid Other

## 2023-10-26 ENCOUNTER — Encounter (HOSPITAL_COMMUNITY): Payer: Self-pay

## 2023-10-26 DIAGNOSIS — N3001 Acute cystitis with hematuria: Secondary | ICD-10-CM

## 2023-10-26 DIAGNOSIS — N926 Irregular menstruation, unspecified: Secondary | ICD-10-CM | POA: Insufficient documentation

## 2023-10-26 DIAGNOSIS — R39198 Other difficulties with micturition: Secondary | ICD-10-CM

## 2023-10-26 LAB — POCT URINALYSIS DIP (MANUAL ENTRY)
Bilirubin, UA: NEGATIVE
Glucose, UA: NEGATIVE mg/dL
Ketones, POC UA: NEGATIVE mg/dL
Leukocytes, UA: NEGATIVE
Nitrite, UA: NEGATIVE
Protein Ur, POC: NEGATIVE mg/dL
Spec Grav, UA: 1.02 (ref 1.010–1.025)
Urobilinogen, UA: 0.2 U/dL
pH, UA: 6.5 (ref 5.0–8.0)

## 2023-10-26 MED ORDER — NITROFURANTOIN MONOHYD MACRO 100 MG PO CAPS: 100.0000 mg | ORAL_CAPSULE | Freq: Two times a day (BID) | ORAL | 0 refills | Status: DC

## 2023-10-26 NOTE — Discharge Instructions (Addendum)
Urinalysis done today which shows evidence of blood in the urine. Will treat with: Macrobid 100mg  twice daily for 5 days Return to urgent care if symptoms worsen or if you become unable to urinate. Follow up with GYN regarding pap smear findings as they need to review these results with you and discuss treatment options

## 2023-10-26 NOTE — ED Provider Notes (Signed)
[facility]    CSN: [id number] Arrival date & time: 10/26/23  1034      History   Chief Complaint Chief Complaint  Patient presents with   UTI Symptoms    HPI April Wallace is a 27 y.o. female.   27 year old female who presents to urgent care with complaints of difficulty urinating.  This started yesterday.  She reports that she is having difficulty getting her urine started and then still feels like her bladder is full after she urinates.  She denies any vaginal symptoms or concerns for STI.     Past Medical History:  Diagnosis Date   Anemia    Headache     Patient Active Problem List   Diagnosis Date Noted   Abnormal menses 10/26/2023   Abnormal uterine bleeding 05/22/2023   Anemia affecting pregnancy in first trimester 11/12/2017   Bacterial vaginitis 10/30/2017   Candida vaginitis 10/30/2017   Abdominal cramping affecting pregnancy 10/30/2017   Morning sickness 10/16/2017    Past Surgical History:  Procedure Laterality Date   INDUCED ABORTION     WISDOM TOOTH EXTRACTION      OB History     Gravida  3   Para      Term      Preterm      AB  2   Living  0      SAB  1   IAB      Ectopic      Multiple      Live Births               Home Medications    Prior to Admission medications   Medication Sig Start Date End Date Taking? Authorizing Provider  etonogestrel-ethinyl estradiol (NUVARING) 0.12-0.015 MG/24HR vaginal ring Insert vaginally and leave in place for 3 consecutive weeks, then remove for 1 week. 10/22/23  Yes Constant, Peggy, MD  neomycin-polymyxin b-dexamethasone (MAXITROL) 3.5-10000-0.1 OINT APPLY 1 INCH INTO LOWER EYELIDS BEFORE BEDTIME RIGHT EYE 07/05/23  Yes [provider]  neomycin-polymyxin b-dexamethasone (MAXITROL) 3.5-10000-0.1 SUSP Place 1 drop into the right eye 4 (four) times daily. 07/05/23  Yes [provider]  cetirizine (ZYRTEC ALLERGY) 10 MG tablet Take 1 tablet (10 mg  total) by mouth daily. 08/13/23   Wallis Bamberg, PA-C  ferrous sulfate 325 (65 FE) MG EC tablet Take 325 mg by mouth daily with breakfast.    [provider]  ferrous sulfate 325 (65 FE) MG tablet Take 1 tablet (325 mg total) by mouth daily with breakfast for 14 days. 12/06/18 12/20/18  Shaune Pollack, MD  ferrous sulfate 325 (65 FE) MG tablet Take 325 mg by mouth daily with breakfast.    [provider]  fluconazole (DIFLUCAN) 150 MG tablet Take 1 tablet (150 mg total) by mouth every 3 (three) days. Patient not taking: Reported on 10/22/2023 09/29/23   Wallis Bamberg, PA-C  ibuprofen (ADVIL) 600 MG tablet Take 1 tablet (600 mg total) by mouth every 6 (six) hours as needed. Patient not taking: Reported on 10/22/2023 08/13/23   Wallis Bamberg, PA-C  Lactic Ac-Citric Ac-Pot Bitart (PHEXXI) 1.8-1-0.4 % GEL 1 applicator per vagina up to one hour prior to intercourse 10/22/23   Constant, Peggy, MD  medroxyPROGESTERone (DEPO-PROVERA) 150 MG/ML injection Inject into the muscle. Patient not taking: Reported on 10/22/2023 12/04/22 02/04/26  [provider]  metoCLOPramide (REGLAN) 10 MG tablet Take 1 tablet (10 mg total) by mouth every 6 (six) hours as needed for nausea (  nausea/headache). Patient not taking: Reported on 10/22/2023 07/12/23   Charlynne Pander, MD  metroNIDAZOLE (FLAGYL) 500 MG tablet Take 1 tablet (500 mg total) by mouth 2 (two) times daily. Patient not taking: Reported on 10/22/2023 09/29/23   Wallis Bamberg, PA-C  norgestimate-ethinyl estradiol (ORTHO-CYCLEN) 0.25-35 MG-MCG tablet Take 1 tablet by mouth daily. Patient not taking: Reported on 10/22/2023 03/19/23   [provider]  pseudoephedrine (SUDAFED) 60 MG tablet Take 1 tablet (60 mg total) by mouth every 8 (eight) hours as needed for congestion. Patient not taking: Reported on 10/22/2023 08/13/23   Wallis Bamberg, PA-C    Family History Family History  Problem Relation Age of Onset   Diabetes Maternal Grandmother     Seizures Maternal Grandmother    Asthma Maternal Grandmother    Cancer Maternal Grandfather    Healthy Mother    Healthy Father     Social History Social History   Tobacco Use   Smoking status: Never    Passive exposure: Never   Smokeless tobacco: Never  Vaping Use   Vaping status: Never Used  Substance Use Topics   Alcohol use: Yes    Comment: Occassionally.   Drug use: No     Allergies   Patient has no known allergies.   Review of Systems Review of Systems  Constitutional:  Negative for chills and fever.  HENT:  Negative for ear pain and sore throat.   Eyes:  Negative for pain and visual disturbance.  Respiratory:  Negative for cough and shortness of breath.   Cardiovascular:  Negative for chest pain and palpitations.  Gastrointestinal:  Negative for abdominal pain and vomiting.  Genitourinary:  Positive for difficulty urinating. Negative for dysuria and hematuria.  Musculoskeletal:  Negative for arthralgias and back pain.  Skin:  Negative for color change and rash.  Neurological:  Negative for seizures and syncope.  All other systems reviewed and are negative.    Physical Exam Triage Vital Signs ED Triage Vitals  Encounter Vitals Group     BP 10/26/23 1133 90/62     Systolic BP Percentile --      Diastolic BP Percentile --      Pulse Rate 10/26/23 1133 69     Resp 10/26/23 1133 16     Temp 10/26/23 1133 98.3 F (36.8 C)     Temp Source 10/26/23 1133 Oral     SpO2 10/26/23 1133 100 %     Weight 10/26/23 1131 114 lb (51.7 kg)     Height 10/26/23 1131 5\' 4"  (1.626 m)     Head Circumference --      Peak Flow --      Pain Score 10/26/23 1129 0     Pain Loc --      Pain Education --      Exclude from Growth Chart --    No data found.  Updated Vital Signs BP 90/62 (BP Location: Left Arm)   Pulse 69   Temp 98.3 F (36.8 C) (Oral)   Resp 16   Ht 5\' 4"  (1.626 m)   Wt 114 lb (51.7 kg)   LMP 07/10/2023 (Exact Date) Comment: Was on contraceptives and  then got off of it  SpO2 100%   BMI 19.57 kg/m   Visual Acuity Right Eye Distance:   Left Eye Distance:   Bilateral Distance:    Right Eye Near:   Left Eye Near:    Bilateral Near:     Physical Exam Vitals and nursing  note reviewed.  Constitutional:      General: She is not in acute distress.    Appearance: She is well-developed.  HENT:     Head: Normocephalic and atraumatic.  Eyes:     Conjunctiva/sclera: Conjunctivae normal.  Cardiovascular:     Rate and Rhythm: Normal rate and regular rhythm.     Heart sounds: No murmur heard. Pulmonary:     Effort: Pulmonary effort is normal. No respiratory distress.     Breath sounds: Normal breath sounds.  Abdominal:     Palpations: Abdomen is soft.     Tenderness: There is no abdominal tenderness.  Musculoskeletal:        General: No swelling.     Cervical back: Neck supple.  Skin:    General: Skin is warm and dry.     Capillary Refill: Capillary refill takes less than 2 seconds.  Neurological:     Mental Status: She is alert.  Psychiatric:        Mood and Affect: Mood normal.      UC Treatments / Results  Labs (all labs ordered are listed, but only abnormal results are displayed) Labs Reviewed  POCT URINALYSIS DIP (MANUAL ENTRY)    EKG   Radiology No results found.  Procedures Procedures (including critical care time)  Medications Ordered in UC Medications - No data to display  Initial Impression / Assessment and Plan / UC Course  I have reviewed the triage vital signs and the nursing notes.  Pertinent labs & imaging results that were available during my care of the patient were reviewed by me and considered in my medical decision making (see chart for details).  Clinical Course as of 10/26/23 1157  Sat Oct 26, 2023  1151 POC urinalysis dipstick [EW]    Clinical Course User Index [EW] Landis Martins, PA-C    Difficulty urinating  Acute cystitis with hematuria   Urinalysis done today which  shows evidence of blood in the urine. Will treat with: Macrobid 100mg  twice daily for 5 days Return to urgent care if symptoms worsen or if you become unable to urinate. Follow up with GYN regarding pap smear findings as they need to review these results with you and discuss treatment options  Final Clinical Impressions(s) / UC Diagnoses   Final diagnoses:  None   Discharge Instructions   None    ED Prescriptions   None    PDMP not reviewed this encounter.   Landis Martins, [place] 10/26/23 1215

## 2023-10-26 NOTE — ED Triage Notes (Signed)
"  I am having problems urinating, it feel's like I have to push to get it out and then my bladder still feel's full". No fever. Symptoms began "yesterday". No vaginal discharge. No concern for STI.

## 2023-10-28 ENCOUNTER — Ambulatory Visit (HOSPITAL_COMMUNITY): Payer: Medicaid Other | Attending: Obstetrics and Gynecology

## 2023-10-28 ENCOUNTER — Telehealth: Payer: Self-pay | Admitting: Emergency Medicine

## 2023-10-28 ENCOUNTER — Encounter: Payer: Self-pay | Admitting: Emergency Medicine

## 2023-10-28 LAB — CYTOLOGY - PAP
Adequacy: ABSENT
Comment: NEGATIVE
High risk HPV: POSITIVE — AB

## 2023-10-28 NOTE — Telephone Encounter (Signed)
-----   Message from [facility] sent at 10/28/2023  8:23 AM EST ----- Please inform patient of abnormal pap smear and need for colposcopy  Dr. Jolayne Panther

## 2023-10-28 NOTE — Telephone Encounter (Signed)
Pt informed of results, message sent front desk to schedule colposcopy.

## 2023-11-07 ENCOUNTER — Ambulatory Visit: Payer: Medicaid Other

## 2023-11-23 ENCOUNTER — Telehealth: Payer: Medicaid Other | Admitting: Nurse Practitioner

## 2023-11-23 DIAGNOSIS — B3731 Acute candidiasis of vulva and vagina: Secondary | ICD-10-CM | POA: Diagnosis not present

## 2023-11-23 MED ORDER — FLUCONAZOLE 150 MG PO TABS
150.0000 mg | ORAL_TABLET | ORAL | 0 refills | Status: DC
Start: 2023-11-23 — End: 2024-01-02

## 2023-11-23 NOTE — Progress Notes (Signed)
I have spent 5 minutes in review of e-visit questionnaire, review and updating patient chart, medical decision making and response to patient.  ° °Jerrell Mangel W Secilia Apps, NP ° °  °

## 2023-11-23 NOTE — Progress Notes (Signed)

## 2023-12-08 ENCOUNTER — Ambulatory Visit
Admission: EM | Admit: 2023-12-08 | Discharge: 2023-12-08 | Disposition: A | Discharge status: Home / Self Care | Payer: Medicaid Other | Attending: Family Medicine | Admitting: Family Medicine

## 2023-12-08 DIAGNOSIS — R519 Headache, unspecified: Secondary | ICD-10-CM

## 2023-12-08 DIAGNOSIS — R5383 Other fatigue: Secondary | ICD-10-CM

## 2023-12-08 MED ORDER — ACETAMINOPHEN 325 MG PO TABS: 650.0000 mg | ORAL_TABLET | Freq: Four times a day (QID) | ORAL | 0 refills | Status: DC | PRN

## 2023-12-08 MED ORDER — CYCLOBENZAPRINE HCL 5 MG PO TABS: 5.0000 mg | ORAL_TABLET | Freq: Every evening | ORAL | 0 refills | Status: DC | PRN
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
## 2023-12-19 ENCOUNTER — Ambulatory Visit: Payer: Medicaid Other | Admitting: Obstetrics and Gynecology

## 2023-12-23 NOTE — Progress Notes (Signed)
 Patient not seen for scheduled colposcopy due to onset of menses with heavy flow

## 2024-01-02 ENCOUNTER — Telehealth: Payer: Medicaid Other | Admitting: Physician Assistant

## 2024-01-02 DIAGNOSIS — B3731 Acute candidiasis of vulva and vagina: Secondary | ICD-10-CM | POA: Diagnosis not present

## 2024-01-02 MED ORDER — FLUCONAZOLE 150 MG PO TABS: 150.0000 mg | ORAL_TABLET | Freq: Every day | ORAL | 0 refills | Status: DC

## 2024-01-02 NOTE — Progress Notes (Signed)
I have spent 5 minutes in review of e-visit questionnaire, review and updating patient chart, medical decision making and response to patient.   Mia Milan Cody Jacklynn Dehaas, PA-C    

## 2024-01-02 NOTE — Progress Notes (Signed)

## 2024-01-14 ENCOUNTER — Ambulatory Visit (INDEPENDENT_AMBULATORY_CARE_PROVIDER_SITE_OTHER): Payer: Medicaid Other | Admitting: Obstetrics and Gynecology

## 2024-01-14 ENCOUNTER — Encounter: Payer: Self-pay | Admitting: Obstetrics and Gynecology

## 2024-01-14 ENCOUNTER — Other Ambulatory Visit (HOSPITAL_COMMUNITY)
Admission: RE | Admit: 2024-01-14 | Discharge: 2024-01-14 | Disposition: A | Discharge status: Home / Self Care | Payer: Medicaid Other | Source: Ambulatory Visit | Attending: Obstetrics and Gynecology | Admitting: Obstetrics and Gynecology

## 2024-01-14 VITALS — BP 110/76 | HR 85 | Ht 64.0 in | Wt 119.0 lb

## 2024-01-14 DIAGNOSIS — N870 Mild cervical dysplasia: Secondary | ICD-10-CM | POA: Diagnosis present

## 2024-01-14 NOTE — Progress Notes (Signed)
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 Patient given informed consent, signed copy in the chart, time out was performed.  Placed in lithotomy position. Cervix viewed with speculum and colposcope after application of acetic acid  and lugol's solution.   Colposcopy adequate?  yes Acetowhite lesions?yes  mild changes at 3 o'clock Punctation?  none Mosaicism?   none Abnormal vasculature?  none Biopsies?  1 at 3 o'clock ECC?  no  COMMENTS: Patient was given post procedure instructions.  Pathology pending  Warden Fillers, MD

## 2024-01-14 NOTE — Progress Notes (Signed)
28 y.o. 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  UPT Negative

## 2024-01-17 ENCOUNTER — Encounter: Payer: Self-pay | Admitting: Obstetrics and Gynecology

## 2024-01-17 LAB — SURGICAL PATHOLOGY

## 2024-01-18 ENCOUNTER — Telehealth: Payer: Medicaid Other | Admitting: Family Medicine

## 2024-01-18 DIAGNOSIS — B3731 Acute candidiasis of vulva and vagina: Secondary | ICD-10-CM

## 2024-01-18 MED ORDER — FLUCONAZOLE 150 MG PO TABS: 150.0000 mg | ORAL_TABLET | Freq: Every day | ORAL | 0 refills | Status: DC

## 2024-01-18 NOTE — Progress Notes (Signed)

## 2024-01-31 ENCOUNTER — Telehealth: Payer: Medicaid Other | Admitting: Physician Assistant

## 2024-01-31 DIAGNOSIS — N760 Acute vaginitis: Secondary | ICD-10-CM

## 2024-01-31 NOTE — Progress Notes (Signed)
Because of recurrent vaginal yeast infections, I feel your condition warrants further evaluation and I recommend that you be seen for a face to face visit.  Please contact your primary care physician practice to be seen. Many offices offer virtual options to be seen via video if you are not comfortable going in person to a medical facility at this time. I do feel you should have testing to make sure that it is not a rare, resistant strain of yeast since you have had frequently. Also, since this has been recurrent, it may also be worthwhile to have your sugar checked to make sure we are not missing anything else that could cause this recurrence.   NOTE: You will NOT be charged for this eVisit.  If you do not have a PCP, [facility] offers a free physician referral service available at [phone]. Our trained staff has the experience, knowledge and resources to put you in touch with a physician who is right for you.   If you are having a true medical emergency, please call 911.     For an urgent face to face visit, [facility] has multiple urgent care centers for your convenience.  Click the link below for the full list of locations and hours, walk-in wait times, appointment scheduling options and driving directions:  Urgent Care - [place], [place], [place], [place], [place], [place]  [place]     Your MyChart E-visit questionnaire answers were reviewed by a board certified advanced clinical practitioner to complete your personal care plan based on your specific symptoms.    Thank you for using e-Visits.   I have spent 5 minutes in review of e-visit questionnaire, review and updating patient chart, medical decision making and response to patient.   Margaretann Loveless, PA-C

## 2024-02-15 ENCOUNTER — Ambulatory Visit
Admission: EM | Admit: 2024-02-15 | Discharge: 2024-02-15 | Disposition: A | Discharge status: Home / Self Care | Attending: Family Medicine | Admitting: Family Medicine

## 2024-02-15 DIAGNOSIS — N760 Acute vaginitis: Secondary | ICD-10-CM | POA: Insufficient documentation

## 2024-02-15 DIAGNOSIS — B9689 Other specified bacterial agents as the cause of diseases classified elsewhere: Secondary | ICD-10-CM | POA: Diagnosis present

## 2024-02-15 MED ORDER — FLUCONAZOLE 150 MG PO TABS: 150.0000 mg | ORAL_TABLET | Freq: Every day | ORAL | 0 refills | Status: DC

## 2024-02-15 MED ORDER — METRONIDAZOLE 0.75 % VA GEL: 1.0000 | Freq: Every day | VAGINAL | 0 refills | Status: DC

## 2024-02-15 NOTE — ED Triage Notes (Signed)
 Patient presents with vaginal itching, states she had an evisit on yesterday and was prescribed Diflucan.

## 2024-02-15 NOTE — ED Provider Notes (Signed)
 [facility]  Note:  This document was prepared using Conservation officer, historic buildings and may include unintentional dictation errors.  MRN: [id number] DOB: [birth year]  Subjective:   April Wallace is a 28 y.o. female presenting for 1 day history of persistent vaginal itching and discharge.  Per her chart, she had a telemedicine visit.  Was prescribed 2 doses of fluconazole for recurrent vaginitis.  She has taken 1 dose.  Would like vaginal testing.  Does not have concern for sexually transmitted infection but is not opposed to it.  No concern for pregnancy.  No urinary symptoms.  No current facility-administered medications for this encounter.  Current Outpatient Medications:    acetaminophen (TYLENOL) 325 MG tablet, Take 2 tablets (650 mg total) by mouth every 6 (six) hours as needed for moderate pain (pain score 4-6)., Disp: 30 tablet, Rfl: 0   cyclobenzaprine (FLEXERIL) 5 MG tablet, Take 1 tablet (5 mg total) by mouth at bedtime as needed., Disp: 30 tablet, Rfl: 0   fluconazole (DIFLUCAN) 150 MG tablet, Take 1 tablet (150 mg total) by mouth daily., Disp: 1 tablet, Rfl: 0   predniSONE (DELTASONE) 20 MG tablet, Take 20 mg by mouth daily., Disp: , Rfl:    triamcinolone cream (KENALOG) 0.5 %, Apply topically 2 (two) times daily., Disp: , Rfl:    No Known Allergies  Past Medical History:  Diagnosis Date   Anemia    Headache      Past Surgical History:  Procedure Laterality Date   INDUCED ABORTION     WISDOM TOOTH EXTRACTION      Family History  Problem Relation Age of Onset   Diabetes Maternal Grandmother    Seizures Maternal Grandmother    Asthma Maternal Grandmother    Cancer Maternal Grandfather    Healthy Mother    Healthy Father     Social History   Tobacco Use   Smoking status: Never    Passive exposure: Never   Smokeless tobacco: Never  Vaping Use   Vaping status: Never Used  Substance Use Topics   Alcohol use: Yes     Comment: Occassionally.   Drug use: No    ROS   Objective:   Vitals: BP 104/67 (BP Location: Left Arm)   Pulse 96   Temp 98.9 F (37.2 C) (Oral)   Resp 18   Ht 5\' 4"  (1.626 m)   Wt 118 lb (53.5 kg)   LMP 01/19/2024   SpO2 98%   BMI 20.25 kg/m   Physical Exam Constitutional:      General: She is not in acute distress.    Appearance: Normal appearance. She is well-developed. She is not ill-appearing, toxic-appearing or diaphoretic.  HENT:     Head: Normocephalic and atraumatic.     Nose: Nose normal.     Mouth/Throat:     Mouth: Mucous membranes are moist.  Eyes:     General: No scleral icterus.       Right eye: No discharge.        Left eye: No discharge.     Extraocular Movements: Extraocular movements intact.  Cardiovascular:     Rate and Rhythm: Normal rate.  Pulmonary:     Effort: Pulmonary effort is normal.  Skin:    General: Skin is warm and dry.  Neurological:     General: No focal deficit present.     Mental Status: She is alert and oriented to person, place, and time.  Psychiatric:  Mood and Affect: Mood normal.        Behavior: Behavior normal.     Assessment and Plan :   PDMP not reviewed this encounter.  1. Acute vaginitis   2. Bacterial vaginosis    As patient has recurrent yeast infections offered a longer course of fluconazole.  She requested topical metronidazole.  Was agreeable and sent this prescription into her pharmacy.  Testing pending, will treat as appropriate otherwise.  Counseled patient on potential for adverse effects with medications prescribed/recommended today, ER and return-to-clinic precautions discussed, patient verbalized understanding.    Wallis Bamberg, [place] 02/16/24 [phone]

## 2024-02-15 NOTE — Discharge Instructions (Addendum)
 I am adding 3 more doses of fluconazole given the history of yeast infection. I am also covering for bacterial vaginosis with Metrogel. We will update your test results and treatment plan Monday-Tuesday.

## 2024-02-17 LAB — CERVICOVAGINAL ANCILLARY ONLY
Bacterial Vaginitis (gardnerella): NEGATIVE
Candida Glabrata: NEGATIVE
Candida Vaginitis: NEGATIVE
Chlamydia: NEGATIVE
Comment: NEGATIVE
Comment: NEGATIVE
Comment: NEGATIVE
Comment: NEGATIVE
Comment: NEGATIVE
Comment: NORMAL
Neisseria Gonorrhea: NEGATIVE
Trichomonas: NEGATIVE

## 2024-03-14 ENCOUNTER — Telehealth: Admitting: Physician Assistant

## 2024-03-14 DIAGNOSIS — N760 Acute vaginitis: Secondary | ICD-10-CM

## 2024-03-14 DIAGNOSIS — B9689 Other specified bacterial agents as the cause of diseases classified elsewhere: Secondary | ICD-10-CM

## 2024-03-14 MED ORDER — METRONIDAZOLE 500 MG PO TABS: 500.0000 mg | ORAL_TABLET | Freq: Two times a day (BID) | ORAL | 0 refills | Status: AC

## 2024-03-14 NOTE — Progress Notes (Signed)

## 2024-03-24 ENCOUNTER — Telehealth: Admitting: Physician Assistant

## 2024-03-24 DIAGNOSIS — B001 Herpesviral vesicular dermatitis: Secondary | ICD-10-CM

## 2024-03-24 MED ORDER — VALACYCLOVIR HCL 1 G PO TABS: 2000.0000 mg | ORAL_TABLET | Freq: Two times a day (BID) | ORAL | 0 refills | Status: AC

## 2024-03-24 NOTE — Progress Notes (Signed)

## 2024-03-24 NOTE — Progress Notes (Signed)
 I have spent 5 minutes in review of e-visit questionnaire, review and updating patient chart, medical decision making and response to patient.   Piedad Climes, PA-C

## 2024-03-26 ENCOUNTER — Telehealth (INDEPENDENT_AMBULATORY_CARE_PROVIDER_SITE_OTHER): Admitting: Obstetrics and Gynecology

## 2024-03-26 DIAGNOSIS — Z3044 Encounter for surveillance of vaginal ring hormonal contraceptive device: Secondary | ICD-10-CM | POA: Diagnosis not present

## 2024-03-26 MED ORDER — ETONOGESTREL-ETHINYL ESTRADIOL 0.12-0.015 MG/24HR VA RING: VAGINAL_RING | VAGINAL | 4 refills | Status: DC

## 2024-03-26 NOTE — Progress Notes (Signed)
 Virtual Visit via Video Note  I connected with Nubia Jenne Campus on 03/26/24 at  1:10 PM EDT by a video enabled telemedicine application and verified that I am speaking with the correct person using two identifiers.  Location: Patient: Home Provider: [facility]   I discussed the limitations of evaluation and management by telemedicine and the availability of in person appointments. The patient expressed understanding and agreed to proceed.  History of Present Illness: 28 y.o. G3P0020 interested in birth control.  Initially chose nuvaring a few months ago. Still finds periods heavy. Interested in options that can help make her periods lighter or go away in addition to contraception.  Previously used depo. Didn't like depo, used previously for 1.5 years, irregular bleeding with this   Observations/Objective: Gen: appears well Psych: affect appropriate  Assessment and Plan: 1. Encounter for surveillance of vaginal ring hormonal contraceptive device (Primary) Counseled on options for contraception including continuous and cyclic administration of pills, patches, rings. Also reviewed IUD. She is interested in continuous trial of nuvaring. New Rx given. F/u for new or worsening symptoms - etonogestrel-ethinyl estradiol (NUVARING) 0.12-0.015 MG/24HR vaginal ring; Insert vaginally and leave in place for 3 consecutive weeks, then replace with new ring  Dispense: 4 each; Refill: 4   Follow Up Instructions:    I discussed the assessment and treatment plan with the patient. The patient was provided an opportunity to ask questions and all were answered. The patient agreed with the plan and demonstrated an understanding of the instructions.   The patient was advised to call back or seek an in-person evaluation if the symptoms worsen or if the condition fails to improve as anticipated.  I provided 10 minutes of non-face-to-face time during this encounter.   Wanita Chamberlain, MD

## 2024-03-27 ENCOUNTER — Other Ambulatory Visit: Payer: Self-pay

## 2024-03-27 MED ORDER — ETONOGESTREL-ETHINYL ESTRADIOL 0.12-0.015 MG/24HR VA RING: VAGINAL_RING | VAGINAL | 4 refills | Status: DC

## 2024-03-30 ENCOUNTER — Telehealth: Payer: Self-pay

## 2024-03-30 NOTE — Telephone Encounter (Signed)
 S/w pt and advised that [facility] on [address] has rx for Nuvaring ready for pick up

## 2024-04-13 ENCOUNTER — Telehealth: Admitting: Physician Assistant

## 2024-04-13 DIAGNOSIS — B3731 Acute candidiasis of vulva and vagina: Secondary | ICD-10-CM | POA: Diagnosis not present

## 2024-04-13 MED ORDER — FLUCONAZOLE 150 MG PO TABS: 150.0000 mg | ORAL_TABLET | ORAL | 0 refills | Status: DC | PRN

## 2024-04-13 NOTE — Progress Notes (Signed)

## 2024-04-15 ENCOUNTER — Ambulatory Visit
Admission: EM | Admit: 2024-04-15 | Discharge: 2024-04-15 | Disposition: A | Discharge status: Home / Self Care | Attending: Family Medicine | Admitting: Family Medicine

## 2024-04-15 DIAGNOSIS — K130 Diseases of lips: Secondary | ICD-10-CM | POA: Diagnosis present

## 2024-04-15 MED ORDER — VALACYCLOVIR HCL 1 G PO TABS: 1000.0000 mg | ORAL_TABLET | Freq: Every day | ORAL | 0 refills | Status: DC

## 2024-04-15 NOTE — ED Triage Notes (Signed)
 Pt c/o intermittent cold sore to lower lip x 3 weeks-states she has been x 2 for same-NAD-steady gait

## 2024-04-15 NOTE — ED Provider Notes (Signed)
 [facility]  Note:  This document was prepared using Conservation officer, historic buildings and may include unintentional dictation errors.  MRN: [id number] DOB: 1996-08-16  Subjective:   April Wallace is a 28 y.o. female presenting for 3-week history of persistent intermittent and recurrent cold sore over the left lower lip.  Patient reports that she has had 2 separate visits for this.  Initially she was seen and started on the typical regimen of 2 g twice daily for 1 day.  Her symptoms improved and resolved.  Shortly thereafter they returned.  Her PCP subsequently put her on a 5-day course.  This did get improvement and resolved before returning again this past week.  Patient no longer has the medication.  Has not had any particular swab of this area.  Reports that the cold sore generally does appear over the same location and occasionally on the other right side of her lip.  No current facility-administered medications for this encounter.  Current Outpatient Medications:    acetaminophen  (TYLENOL ) 325 MG tablet, Take 2 tablets (650 mg total) by mouth every 6 (six) hours as needed for moderate pain (pain score 4-6)., Disp: 30 tablet, Rfl: 0   cyclobenzaprine  (FLEXERIL ) 5 MG tablet, Take 1 tablet (5 mg total) by mouth at bedtime as needed., Disp: 30 tablet, Rfl: 0   etonogestrel -ethinyl estradiol  (NUVARING) 0.12-0.015 MG/24HR vaginal ring, Insert vaginally and leave in place for 3 consecutive weeks, then remove for 1 week., Disp: 1 each, Rfl: 4   fluconazole  (DIFLUCAN ) 150 MG tablet, Take 1 tablet (150 mg total) by mouth every 3 (three) days as needed., Disp: 2 tablet, Rfl: 0   metroNIDAZOLE  (METROGEL ) 0.75 % vaginal gel, Place 1 Applicatorful vaginally daily. Insert one applicator vaginally once daily., Disp: 70 g, Rfl: 0   predniSONE (DELTASONE) 20 MG tablet, Take 20 mg by mouth daily., Disp: , Rfl:    triamcinolone cream (KENALOG) 0.5 %, Apply topically 2 (two)  times daily., Disp: , Rfl:    No Known Allergies  Past Medical History:  Diagnosis Date   Anemia    Headache      Past Surgical History:  Procedure Laterality Date   INDUCED ABORTION     WISDOM TOOTH EXTRACTION      Family History  Problem Relation Age of Onset   Diabetes Maternal Grandmother    Seizures Maternal Grandmother    Asthma Maternal Grandmother    Cancer Maternal Grandfather    Healthy Mother    Healthy Father     Social History   Tobacco Use   Smoking status: Never    Passive exposure: Never   Smokeless tobacco: Never  Vaping Use   Vaping status: Never Used  Substance Use Topics   Alcohol use: Yes    Comment: Occassionally.   Drug use: No    ROS   Objective:   Vitals: BP 109/76 (BP Location: Right Arm)   Pulse 91   Temp 98.8 F (37.1 C) (Oral)   Resp 16   LMP 03/18/2024   SpO2 97%   Physical Exam Constitutional:      General: She is not in acute distress.    Appearance: Normal appearance. She is well-developed. She is not ill-appearing, toxic-appearing or diaphoretic.  HENT:     Head: Normocephalic and atraumatic.     Nose: Nose normal.     Mouth/Throat:     Mouth: Mucous membranes are moist.   Eyes:     General: No  scleral icterus.       Right eye: No discharge.        Left eye: No discharge.     Extraocular Movements: Extraocular movements intact.  Cardiovascular:     Rate and Rhythm: Normal rate.  Pulmonary:     Effort: Pulmonary effort is normal.  Skin:    General: Skin is warm and dry.  Neurological:     General: No focal deficit present.     Mental Status: She is alert and oriented to person, place, and time.  Psychiatric:        Mood and Affect: Mood normal.        Behavior: Behavior normal.     Assessment and Plan :   PDMP not reviewed this encounter.  1. Lip lesion    HSV culture pending.  Recommend starting suppressive therapy with valacyclovir  at 1 g daily.  Provided with a 30-day supply, recommend  following up with PCP urgently.  Should consider referral to dermatology if there is no response to suppressive treatment and especially if the lip lesion persists.  Counseled patient on potential for adverse effects with medications prescribed/recommended today, ER and return-to-clinic precautions discussed, patient verbalized understanding.    Adolph Hoop, [place] 04/15/24 1610

## 2024-04-16 LAB — HSV 1/2 PCR (SURFACE)
HSV-1 DNA: NOT DETECTED
HSV-2 DNA: NOT DETECTED

## 2024-04-20 ENCOUNTER — Telehealth: Admitting: Nurse Practitioner

## 2024-04-20 DIAGNOSIS — B001 Herpesviral vesicular dermatitis: Secondary | ICD-10-CM

## 2024-04-20 DIAGNOSIS — R220 Localized swelling, mass and lump, head: Secondary | ICD-10-CM

## 2024-04-20 MED ORDER — PREDNISONE 10 MG PO TABS: ORAL_TABLET | ORAL | 0 refills | Status: DC

## 2024-04-20 NOTE — Progress Notes (Signed)
 Virtual Visit Consent   April Wallace, you are scheduled for a virtual visit with a [facility] provider today. Just as with appointments in the office, your consent must be obtained to participate. Your consent will be active for this visit and any virtual visit you may have with one of our providers in the next 365 days. If you have a MyChart account, a copy of this consent can be sent to you electronically.  As this is a virtual visit, video technology does not allow for your provider to perform a traditional examination. This may limit your provider's ability to fully assess your condition. If your provider identifies any concerns that need to be evaluated in person or the need to arrange testing (such as labs, EKG, etc.), we will make arrangements to do so. Although advances in technology are sophisticated, we cannot ensure that it will always work on either your end or our end. If the connection with a video visit is poor, the visit may have to be switched to a telephone visit. With either a video or telephone visit, we are not always able to ensure that we have a secure connection.  By engaging in this virtual visit, you consent to the provision of healthcare and authorize for your insurance to be billed (if applicable) for the services provided during this visit. Depending on your insurance coverage, you may receive a charge related to this service.  I need to obtain your verbal consent now. Are you willing to proceed with your visit today? April Wallace has provided verbal consent on 04/20/2024 for a virtual visit (video or telephone). Mardene Shake, FNP  Date: 04/20/2024 5:12 PM   Virtual Visit via Video Note   I, Mardene Shake, connected with  April Wallace  ([id number], [birth year]) on 04/20/24 at  5:15 PM EDT by a video-enabled telemedicine application and verified that I am speaking with the correct person using two identifiers.  Location: Patient: Virtual Visit  Location Patient: Home Provider: Virtual Visit Location Provider: Home Office   I discussed the limitations of evaluation and management by telemedicine and the availability of in person appointments. The patient expressed understanding and agreed to proceed.    History of Present Illness: April Wallace is a 28 y.o. who identifies as a female who was assigned female at birth, and is being seen today for follow up from her visit last week.   She feels like her lips get more swollen in the morning and takes about 4 hours for them to decrease in size   She went to the UC on 04/15/24 due to lower lip pain and swelling  She had HSV testing completed which was negative  She has been taking Valtrex  since that time without any relief (this was her 3rd course of Valtrex )   Her lip started to swell one month ago- only her bottom lip   Denies any new medications or supplements  On chart review she was dispensed Bactrim for 5 days on 4/2 but she cannot remember taking that and denies having a UTI / does not remember taking that medicine  Last antibiotic was Metrogel  for BV   She uses the Nuvaring started in October of 2024   Prior to this episode she has had oral lesions in the past without any positive testing  She has not used Valtrex  in the past  In the past she has just let the lesions resolve   She has also tried Lysine and Abreva  and feels they irritate her lips more    Problems:  Patient Active Problem List   Diagnosis Date Noted   Abnormal menses 10/26/2023   Abnormal uterine bleeding 05/22/2023   Anemia affecting pregnancy in first trimester 11/12/2017   Bacterial vaginitis 10/30/2017   Candida vaginitis 10/30/2017   Abdominal cramping affecting pregnancy 10/30/2017   Morning sickness 10/16/2017    Allergies: No Known Allergies   Current Outpatient Medications:    acetaminophen  (TYLENOL ) 325 MG tablet, Take 2 tablets (650 mg total) by mouth every 6 (six) hours as  needed for moderate pain (pain score 4-6)., Disp: 30 tablet, Rfl: 0   etonogestrel -ethinyl estradiol  (NUVARING) 0.12-0.015 MG/24HR vaginal ring, Insert vaginally and leave in place for 3 consecutive weeks, then remove for 1 week., Disp: 1 each, Rfl: 4   valACYclovir  (VALTREX ) 1000 MG tablet, Take 1 tablet (1,000 mg total) by mouth daily., Disp: 30 tablet, Rfl: 0    Observations/Objective: Patient is well-developed, well-nourished in no acute distress.  Resting comfortably  at home.  Head is normocephalic, atraumatic.  No labored breathing.  Speech is clear and coherent with logical content.  Patient is alert and oriented at baseline.    Assessment and Plan:  1. Recurrent cold sores  Discuss with PCP or OBGYN about daily suppressant / further work up for confirmation of HSV   Advised to stop Valtrex  and Abreva for now   Keep lips moisturized with Aquaphor or Vaseline   Meds ordered this encounter  Medications   predniSONE (DELTASONE) 10 MG tablet    Sig: Take 4 tablets (40mg ) on days 1-4, then 3 tablets (30mg ) on days 5-8, then 2 tablets (20mg ) on days 9-11, then 1 tablet daily for days 12-14. Take with food.    Dispense:  37 tablet    Refill:  0     Follow Up Instructions: I discussed the assessment and treatment plan with the patient. The patient was provided an opportunity to ask questions and all were answered. The patient agreed with the plan and demonstrated an understanding of the instructions.  A copy of instructions were sent to the patient via MyChart unless otherwise noted below.    The patient was advised to call back or seek an in-person evaluation if the symptoms worsen or if the condition fails to improve as anticipated.    Mardene Shake, FNP

## 2024-05-02 ENCOUNTER — Emergency Department (HOSPITAL_COMMUNITY)
Admission: EM | Admit: 2024-05-02 | Discharge: 2024-05-02 | Disposition: A | Discharge status: Home / Self Care | Attending: Emergency Medicine | Admitting: Emergency Medicine

## 2024-05-02 ENCOUNTER — Other Ambulatory Visit: Payer: Self-pay

## 2024-05-02 DIAGNOSIS — L292 Pruritus vulvae: Secondary | ICD-10-CM | POA: Diagnosis present

## 2024-05-02 DIAGNOSIS — R599 Enlarged lymph nodes, unspecified: Secondary | ICD-10-CM | POA: Diagnosis not present

## 2024-05-02 NOTE — ED Provider Notes (Signed)
 [place] EMERGENCY DEPARTMENT AT [facility] Provider Note   CSN: [id number] Arrival date & time: 05/02/24  4098     History  Chief Complaint  Patient presents with   Vaginal Itching    April Wallace is a 28 y.o. female.  [facility] presents to the emergency department complaining of right-sided leg pain.  She states that she believes she may have an infected lymph node.  She was prescribed medication for a likely yeast infection and STI testing was performed and is pending.  She denies fever, nausea, vomiting, other systemic symptoms at this time.  Past medical history significant for BV, Candida infections   Vaginal Itching       Home Medications Prior to Admission medications   Medication Sig Start Date End Date Taking? Authorizing Provider  acetaminophen  (TYLENOL ) 325 MG tablet Take 2 tablets (650 mg total) by mouth every 6 (six) hours as needed for moderate pain (pain score 4-6). 12/08/23   Adolph Hoop, PA-C  etonogestrel -ethinyl estradiol  (NUVARING) 0.12-0.015 MG/24HR vaginal ring Insert vaginally and leave in place for 3 consecutive weeks, then remove for 1 week. 03/27/24   Ajewole, Christana, MD  predniSONE  (DELTASONE ) 10 MG tablet Take 4 tablets (40mg ) on days 1-4, then 3 tablets (30mg ) on days 5-8, then 2 tablets (20mg ) on days 9-11, then 1 tablet daily for days 12-14. Take with food. 04/20/24   Mardene Shake, FNP  valACYclovir  (VALTREX ) 1000 MG tablet Take 1 tablet (1,000 mg total) by mouth daily. 04/15/24   Adolph Hoop, PA-C      Allergies    Patient has no known allergies.    Review of Systems   Review of Systems  Physical Exam Updated Vital Signs BP 116/82   Pulse 100   Temp 99.1 F (37.3 C) (Oral)   Resp 18   Ht 5\' 4"  (1.626 m)   Wt 53.5 kg   LMP 03/18/2024   SpO2 100%   BMI 20.25 kg/m  Physical Exam Vitals and nursing note reviewed. Exam conducted with a chaperone present.   Constitutional:      General: She is not in acute distress.    Appearance: She is well-developed.  HENT:     Head: Normocephalic and atraumatic.  Eyes:     Conjunctiva/sclera: Conjunctivae normal.  Cardiovascular:     Rate and Rhythm: Normal rate and regular rhythm.     Heart sounds: No murmur heard. Pulmonary:     Effort: Pulmonary effort is normal. No respiratory distress.     Breath sounds: Normal breath sounds.  Abdominal:     Palpations: Abdomen is soft.     Tenderness: There is no abdominal tenderness.  Genitourinary:    General: Normal vulva.     Comments: Firm tender area noted in the right inner thigh. No fluctuance, erythema, induration. White discharge noted from vagina Musculoskeletal:        General: No swelling.     Cervical back: Neck supple.  Skin:    General: Skin is warm and dry.     Capillary Refill: Capillary refill takes less than 2 seconds.  Neurological:     Mental Status: She is alert.  Psychiatric:        Mood and Affect: Mood normal.     ED Results / Procedures / Treatments   Labs (all labs ordered are listed, but only abnormal results are displayed) Labs Reviewed - No data to display  EKG  None  Radiology No results found.  Procedures Procedures    Medications Ordered in ED Medications - No data to display  ED Course/ Medical Decision Making/ A&P                                 Medical Decision Making  This patient presents to the ED for concern of leg pain, this involves an extensive number of treatment options, and is a complaint that carries with it a high risk of complications and morbidity.  The differential diagnosis includes lymphadenopathy, abscess, cellulitis, others   Co morbidities that complicate the patient evaluation  Candida vaginitis   Additional history obtained:   External records from outside source obtained and reviewed including urgent care notes   Social Determinants of Health:  Patient has  Medicaid for her primary health insurance type   Test / Admission - Considered:  Patient with presentation most consistent with a swollen lymph node. No indication for further emergent workup at this time. No signs of abscess, cellulitis. Patient will use ibuprofen  at home and follow up as needed with her primary team.          Final Clinical Impression(s) / ED Diagnoses Final diagnoses:  Swollen lymph nodes    Rx / DC Orders ED Discharge Orders     None         Delories Fetter 05/02/24 0306    Lindle Rhea, MD 05/02/24 [phone]

## 2024-05-02 NOTE — ED Triage Notes (Signed)
 Patient report vaginal itching and discharges x 2 days.

## 2024-05-02 NOTE — Discharge Instructions (Signed)
 You were seen this morning for a swollen lymph node. You may use ibuprofen  as an antiinflammatory. Follow up with your primary care provider.

## 2024-05-04 ENCOUNTER — Ambulatory Visit: Admitting: Obstetrics and Gynecology

## 2024-05-04 ENCOUNTER — Encounter: Payer: Self-pay | Admitting: Obstetrics and Gynecology

## 2024-05-04 VITALS — BP 108/74 | HR 106 | Ht 64.0 in | Wt 113.6 lb

## 2024-05-04 DIAGNOSIS — N939 Abnormal uterine and vaginal bleeding, unspecified: Secondary | ICD-10-CM | POA: Diagnosis not present

## 2024-05-04 DIAGNOSIS — Z30017 Encounter for initial prescription of implantable subdermal contraceptive: Secondary | ICD-10-CM

## 2024-05-04 DIAGNOSIS — Z3202 Encounter for pregnancy test, result negative: Secondary | ICD-10-CM | POA: Diagnosis not present

## 2024-05-04 DIAGNOSIS — Z3009 Encounter for other general counseling and advice on contraception: Secondary | ICD-10-CM | POA: Diagnosis not present

## 2024-05-04 LAB — POCT URINE PREGNANCY: Preg Test, Ur: NEGATIVE

## 2024-05-04 MED ORDER — ETONOGESTREL 68 MG ~~LOC~~ IMPL
68.0000 mg | DRUG_IMPLANT | Freq: Once | SUBCUTANEOUS | Status: AC
  Administered 2024-05-04: 68 mg via SUBCUTANEOUS

## 2024-05-04 NOTE — Progress Notes (Signed)
   GYNECOLOGY OFFICE PROCEDURE NOTE  April Wallace is a 28 y.o. G2P0020 here for Nexplanon  insertion.    Nexplanon  Insertion Procedure Patient identified, informed consent performed, consent signed.   Patient does understand that irregular bleeding is a very common side effect of this medication. She was advised to have backup contraception for one week after placement. Pregnancy test in clinic today was negative.  Appropriate time out taken.    Patient's left arm was prepped and draped in the usual sterile fashion. The ruler used to measure and mark insertion area.  Patient was prepped with alcohol swab and then injected with 3 ml of 1% lidocaine .  She was prepped with betadine, Nexplanon  removed from packaging,  Device confirmed in needle, then inserted full length of needle and withdrawn per handbook instructions. Nexplanon  was able to palpated in the patient's arm; patient declined to palpate insert herself. There was minimal blood loss.  Patient insertion site covered with guaze and a pressure bandage to reduce any bruising.    The patient tolerated the procedure with significant anxiety & distress. Offered to terminate procedure several times. Has needle phobia. Would recommend pre-procedural anxiolytic when Nexplanon  is due for removal.   Patient was given post procedure instructions.   Loralyn Rochester, MD Obstetrician & Gynecologist, [facility] for [employer], [facility]

## 2024-05-04 NOTE — Patient Instructions (Signed)
 Please talk to your primary care doctor about getting your thyroid checked and testing for [place] Disease.

## 2024-05-04 NOTE — Progress Notes (Signed)
 Pt presents for [facility] consult. Currently using Nuvaring. Pt had anemia and wants BC that stops periods. Unsure of method  Last unprotected sex unknown  Negative UPT today

## 2024-05-04 NOTE — Progress Notes (Signed)
   RETURN GYNECOLOGY VISIT  Subjective:  April Wallace is a 28 y.o. G2P0020 with AUB on Nuvaring presenting for follow up  Notes heavy, painful periods. Follows with PCP for anemia, most recent Hgb 8.4. Previously on Nuvaring. Wants something that makes periods lighter/shorter. Was going to try to take Nuvaring continuously, but is having issues getting it from the pharmacy due to insurance issues. Has been on Depo in the past but did not like the irregular bleeding. Still changing a tampon hourly on the heaviest day of her period even with Nuvaring. Notes periods have been heavy since menarche. She has not been tested for vWD. No recent TSH or pelvic US  on file (ordered, not complete).  Has significant needle phobia    I personally reviewed the following: - Pap 10/22/23 LSIL/HPV+ - CBC 07/12/23 Hgb 8.4, MCV 76.5, plt 223  Objective:   Vitals:   05/04/24 1405  BP: 108/74  Pulse: (!) 106  Weight: 113 lb 9.6 oz (51.5 kg)  Height: 5\' 4"  (1.626 m)   General:  Alert, oriented and cooperative. Patient is in no acute distress.  Skin: Skin is warm and dry. No rash noted.   Cardiovascular: Normal heart rate noted  Respiratory: Normal respiratory effort, no problems with respiration noted  Abdomen: Soft, non-tender, non-distended    Assessment and Plan:  April Wallace is a 28 y.o. with AUB  Abnormal uterine bleeding (AUB) Birth control counseling Encounter for insertion of subdermal contraceptive Following with PCP for anemia Needs updated CBC, TSH, and pelvic US . Also recommend VWD testing. She is due for blood work through PCP today. She has significant fear of needles so hopefully patient can obtain this testing through PCP blood work today Pelvic US  re-ordered Discussed management options including - continuous CHCs (pill, patch, ring), IUD. She is interested in Nexplanon . Reviewed nexplanon  is known to cause irregularly irregular bleeding, but could potentially make  bleeding lighter and help with her pain. Nexplanon  would not be first choice with bleeding profile, but patient is worried about pain with IUD insertion Reviewed role for scheduled NSAIDs with CHCs  Also discussed endometrial ablation or hysterectomy with completion of childbearing After discussion, she opted for Nexplanon . See procedure note. Has significant anxiety related to needles. Recommend pre-procedure anxiolytic prior to removal in the future. -     US  PELVIC COMPLETE WITH TRANSVAGINAL; Future -     POCT urine pregnancy -     etonogestrel  (NEXPLANON ) implant 68 mg  April Marsh, MD

## 2024-05-25 ENCOUNTER — Ambulatory Visit
Admission: EM | Admit: 2024-05-25 | Discharge: 2024-05-25 | Disposition: A | Discharge status: Home / Self Care | Attending: Family Medicine | Admitting: Family Medicine

## 2024-05-25 ENCOUNTER — Other Ambulatory Visit: Payer: Self-pay

## 2024-05-25 DIAGNOSIS — M545 Low back pain, unspecified: Secondary | ICD-10-CM | POA: Diagnosis not present

## 2024-05-25 MED ORDER — NAPROXEN 375 MG PO TABS: 375.0000 mg | ORAL_TABLET | Freq: Two times a day (BID) | ORAL | 0 refills | Status: DC | PRN

## 2024-05-25 MED ORDER — CYCLOBENZAPRINE HCL 10 MG PO TABS: 10.0000 mg | ORAL_TABLET | Freq: Two times a day (BID) | ORAL | 0 refills | Status: DC | PRN

## 2024-05-25 NOTE — Discharge Instructions (Signed)
 You may start Flexeril  twice daily as needed.  This is a muscle relaxer and can make you drowsy.  Do not drink alcohol or drive while on this medication.  May take naproxen  twice daily as needed for back pain or headache.  We do heat to the low back.  Lots of rest.  Please follow-up with your PCP in 2 days for recheck.  Please go to the ER for any worsening symptoms.  Hope you feel better soon!
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
## 2024-05-26 ENCOUNTER — Emergency Department (HOSPITAL_BASED_OUTPATIENT_CLINIC_OR_DEPARTMENT_OTHER)
Admission: EM | Admit: 2024-05-26 | Discharge: 2024-05-26 | Disposition: A | Discharge status: Home / Self Care | Attending: Emergency Medicine | Admitting: Emergency Medicine

## 2024-05-26 ENCOUNTER — Emergency Department (HOSPITAL_BASED_OUTPATIENT_CLINIC_OR_DEPARTMENT_OTHER)

## 2024-05-26 ENCOUNTER — Encounter (HOSPITAL_BASED_OUTPATIENT_CLINIC_OR_DEPARTMENT_OTHER): Payer: Self-pay | Admitting: Emergency Medicine

## 2024-05-26 DIAGNOSIS — Y92410 Unspecified street and highway as the place of occurrence of the external cause: Secondary | ICD-10-CM | POA: Insufficient documentation

## 2024-05-26 DIAGNOSIS — R109 Unspecified abdominal pain: Secondary | ICD-10-CM | POA: Insufficient documentation

## 2024-05-26 DIAGNOSIS — S060X0A Concussion without loss of consciousness, initial encounter: Secondary | ICD-10-CM

## 2024-05-26 DIAGNOSIS — M545 Low back pain, unspecified: Secondary | ICD-10-CM | POA: Diagnosis not present

## 2024-05-26 DIAGNOSIS — S0990XA Unspecified injury of head, initial encounter: Secondary | ICD-10-CM | POA: Diagnosis present

## 2024-05-26 LAB — HCG, SERUM, QUALITATIVE: Preg, Serum: NEGATIVE

## 2024-05-26 MED ORDER — ACETAMINOPHEN 325 MG PO TABS
650.0000 mg | ORAL_TABLET | Freq: Once | ORAL | Status: AC
  Administered 2024-05-26: 650 mg via ORAL
  Filled 2024-05-26: qty 2
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
## 2024-05-26 NOTE — Discharge Instructions (Signed)
 As discussed, your imaging is reassuring.  No signs of brain bleeds or fractures.  You could have a concussion, which is diagnosis based off of symptoms rather than imaging.  Symptoms can last for several weeks.  If you are having a headache you can alternate between Ibuprofen  and Tylenol  every 4 hours as needed. Avoid strenuous activity and limit screen time, as this can worsen your symptoms.  Follow-up with your primary care fighter in the next 5 days for reevaluation.  Get help right away if: You have very bad headaches or your headaches get worse. You have any of these problems: Feeling weak or numb in any part of your body. Slurred speech. Changes in how you see (vision). Feeling mixed up (confused). You vomit often. You faint or other people have trouble waking you up. You have a seizure.
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
## 2024-06-09 ENCOUNTER — Telehealth: Admitting: Family Medicine

## 2024-06-09 DIAGNOSIS — B9689 Other specified bacterial agents as the cause of diseases classified elsewhere: Secondary | ICD-10-CM

## 2024-06-09 DIAGNOSIS — N760 Acute vaginitis: Secondary | ICD-10-CM

## 2024-06-09 MED ORDER — METRONIDAZOLE 0.75 % VA GEL: 1.0000 | Freq: Every day | VAGINAL | 0 refills | Status: AC

## 2024-06-09 MED ORDER — METRONIDAZOLE 500 MG PO TABS: 500.0000 mg | ORAL_TABLET | Freq: Two times a day (BID) | ORAL | 0 refills | Status: DC

## 2024-06-09 NOTE — Progress Notes (Signed)
 E-Visit for Vaginal Symptoms  We are sorry that you are not feeling well. Here is how we plan to help! Based on what you shared with me it looks like you: May have a vaginosis due to bacteria  Vaginosis is an inflammation of the vagina that can result in discharge, itching and pain. The cause is usually a change in the normal balance of vaginal bacteria or an infection. Vaginosis can also result from reduced estrogen levels after menopause.  The most common causes of vaginosis are:   Bacterial vaginosis which results from an overgrowth of one on several organisms that are normally present in your vagina.   Yeast infections which are caused by a naturally occurring fungus called candida.   Vaginal atrophy (atrophic vaginosis) which results from the thinning of the vagina from reduced estrogen levels after menopause.   Trichomoniasis which is caused by a parasite and is commonly transmitted by sexual intercourse.  Factors that increase your risk of developing vaginosis include: Medications, such as antibiotics and steroids Uncontrolled diabetes Use of hygiene products such as bubble bath, vaginal spray or vaginal deodorant Douching Wearing damp or tight-fitting clothing Using an intrauterine device (IUD) for birth control Hormonal changes, such as those associated with pregnancy, birth control pills or menopause Sexual activity Having a sexually transmitted infection  Your treatment plan is Metronidazole  or Flagyl  500mg  twice a day for 7 days.  I have electronically sent this prescription into the pharmacy that you have chosen.  Be sure to take all of the medication as directed. Stop taking any medication if you develop a rash, tongue swelling or shortness of breath. Mothers who are breast feeding should consider pumping and discarding their breast milk while on these antibiotics. However, there is no consensus that infant exposure at these doses would be harmful.  Remember that  medication creams can weaken latex condoms. SABRA   HOME CARE:  Good hygiene may prevent some types of vaginosis from recurring and may relieve some symptoms:  Avoid baths, hot tubs and whirlpool spas. Rinse soap from your outer genital area after a shower, and dry the area well to prevent irritation. Don't use scented or harsh soaps, such as those with deodorant or antibacterial action. Avoid irritants. These include scented tampons and pads. Wipe from front to back after using the toilet. Doing so avoids spreading fecal bacteria to your vagina.  Other things that may help prevent vaginosis include:  Don't douche. Your vagina doesn't require cleansing other than normal bathing. Repetitive douching disrupts the normal organisms that reside in the vagina and can actually increase your risk of vaginal infection. Douching won't clear up a vaginal infection. Use a latex condom. Both female and female latex condoms may help you avoid infections spread by sexual contact. Wear cotton underwear. Also wear pantyhose with a cotton crotch. If you feel comfortable without it, skip wearing underwear to bed. Yeast thrives in [employer] Your symptoms should improve in the next day or two.  GET HELP RIGHT AWAY IF:  You have pain in your lower abdomen ( pelvic area or over your ovaries) You develop nausea or vomiting You develop a fever Your discharge changes or worsens You have persistent pain with intercourse You develop shortness of breath, a rapid pulse, or you faint.  These symptoms could be signs of problems or infections that need to be evaluated by a medical provider now.  MAKE SURE YOU   Understand these instructions. Will watch your condition. Will get help right  away if you are not doing well or get worse.  Thank you for choosing an e-visit.  Your e-visit answers were reviewed by a board certified advanced clinical practitioner to complete your personal care plan. Depending upon the  condition, your plan could have included both over the counter or prescription medications.  Please review your pharmacy choice. Make sure the pharmacy is open so you can pick up prescription now. If there is a problem, you may contact your provider through [employer] and have the prescription routed to another pharmacy.  Your safety is important to us . If you have drug allergies check your prescription carefully.   For the next 24 hours you can use MyChart to ask questions about today's visit, request a non-urgent call back, or ask for a work or school excuse. You will get an email in the next two days asking about your experience. I hope that your e-visit has been valuable and will speed your recovery.  I provided 5 minutes of non face-to-face time during this encounter for chart review, medication and order placement, as well as and documentation.    I provided 5 minutes of non face-to-face time during this encounter for chart review, medication and order placement, as well as and documentation.

## 2024-06-09 NOTE — Addendum Note (Signed)
 Addended by: MOISHE CHIQUITA HERO on: 06/09/2024 02:58 PM   Modules accepted: Orders

## 2024-06-18 ENCOUNTER — Ambulatory Visit: Attending: Physician Assistant | Admitting: Physical Therapy

## 2024-06-18 ENCOUNTER — Other Ambulatory Visit: Payer: Self-pay

## 2024-06-18 ENCOUNTER — Encounter: Payer: Self-pay | Admitting: Physical Therapy

## 2024-06-18 DIAGNOSIS — M7989 Other specified soft tissue disorders: Secondary | ICD-10-CM | POA: Insufficient documentation

## 2024-06-18 DIAGNOSIS — M542 Cervicalgia: Secondary | ICD-10-CM | POA: Insufficient documentation

## 2024-06-18 DIAGNOSIS — M25511 Pain in right shoulder: Secondary | ICD-10-CM | POA: Diagnosis present

## 2024-06-18 DIAGNOSIS — M25611 Stiffness of right shoulder, not elsewhere classified: Secondary | ICD-10-CM | POA: Insufficient documentation
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
## 2024-06-26 ENCOUNTER — Encounter

## 2024-07-04 ENCOUNTER — Ambulatory Visit

## 2024-07-04 DIAGNOSIS — M25611 Stiffness of right shoulder, not elsewhere classified: Secondary | ICD-10-CM

## 2024-07-04 DIAGNOSIS — M7989 Other specified soft tissue disorders: Secondary | ICD-10-CM

## 2024-07-04 DIAGNOSIS — M542 Cervicalgia: Secondary | ICD-10-CM | POA: Diagnosis not present

## 2024-07-04 DIAGNOSIS — M25511 Pain in right shoulder: Secondary | ICD-10-CM
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
## 2024-07-11 ENCOUNTER — Ambulatory Visit

## 2024-07-18 ENCOUNTER — Ambulatory Visit: Attending: Physician Assistant

## 2024-07-18 DIAGNOSIS — M542 Cervicalgia: Secondary | ICD-10-CM | POA: Insufficient documentation

## 2024-07-18 DIAGNOSIS — M25511 Pain in right shoulder: Secondary | ICD-10-CM | POA: Diagnosis present

## 2024-07-18 DIAGNOSIS — M25611 Stiffness of right shoulder, not elsewhere classified: Secondary | ICD-10-CM | POA: Insufficient documentation

## 2024-07-18 DIAGNOSIS — M7989 Other specified soft tissue disorders: Secondary | ICD-10-CM | POA: Insufficient documentation
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
## 2024-07-29 ENCOUNTER — Encounter: Payer: Self-pay | Admitting: Physical Therapy

## 2024-07-29 ENCOUNTER — Ambulatory Visit: Admitting: Physical Therapy

## 2024-07-29 DIAGNOSIS — M542 Cervicalgia: Secondary | ICD-10-CM | POA: Diagnosis not present

## 2024-07-29 DIAGNOSIS — M7989 Other specified soft tissue disorders: Secondary | ICD-10-CM

## 2024-07-29 DIAGNOSIS — M25611 Stiffness of right shoulder, not elsewhere classified: Secondary | ICD-10-CM

## 2024-07-29 DIAGNOSIS — M25511 Pain in right shoulder: Secondary | ICD-10-CM
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
## 2024-08-01 ENCOUNTER — Ambulatory Visit: Admitting: Physical Therapy

## 2024-08-15 ENCOUNTER — Telehealth: Admitting: Emergency Medicine

## 2024-08-15 DIAGNOSIS — N760 Acute vaginitis: Secondary | ICD-10-CM | POA: Diagnosis not present

## 2024-08-15 DIAGNOSIS — B9689 Other specified bacterial agents as the cause of diseases classified elsewhere: Secondary | ICD-10-CM | POA: Diagnosis not present

## 2024-08-15 MED ORDER — METRONIDAZOLE 500 MG PO TABS: 500.0000 mg | ORAL_TABLET | Freq: Two times a day (BID) | ORAL | 0 refills | Status: AC

## 2024-08-15 NOTE — Progress Notes (Signed)

## 2024-08-19 ENCOUNTER — Telehealth: Payer: Self-pay | Admitting: Physician Assistant

## 2024-08-19 NOTE — Telephone Encounter (Signed)
 Fey called in to be scheduled for Hematology. As of today we do not have a referral for [facility]. I provided Mairead with the updated fax number to have her provider re-send her referral.

## 2024-09-10 ENCOUNTER — Inpatient Hospital Stay: Attending: Hematology and Oncology | Admitting: Hematology and Oncology

## 2024-09-10 ENCOUNTER — Inpatient Hospital Stay

## 2024-09-10 VITALS — BP 104/69 | HR 96 | Temp 98.7°F | Resp 17 | Ht 64.0 in | Wt 118.8 lb

## 2024-09-10 DIAGNOSIS — N920 Excessive and frequent menstruation with regular cycle: Secondary | ICD-10-CM | POA: Insufficient documentation

## 2024-09-10 DIAGNOSIS — D500 Iron deficiency anemia secondary to blood loss (chronic): Secondary | ICD-10-CM | POA: Insufficient documentation

## 2024-09-10 NOTE — Progress Notes (Signed)
 [facility] CONSULT NOTE  Patient Care Team: Rosalea Rosina SAILOR, [place] as PCP - General (Physician Assistant)  CHIEF COMPLAINTS/PURPOSE OF CONSULTATION:  : Ablation for iron deficiency anemia  HISTORY OF PRESENTING ILLNESS:  History of Present Illness April Wallace is a 28 year old female who presents for evaluation of iron deficiency. She was referred for evaluation of her iron deficiency.  She experiences heavy menstrual cycles, which she believes contribute to her iron deficiency. Birth control has not effectively reduced the bleeding. She cannot tolerate iron pills due to side effects like nausea, constipation, and gastritis, leading to significant discomfort. Her diet includes both vegetarian and carnivorous elements, and she does not experience diarrhea after consuming gluten. There is no evidence of gastrointestinal bleeding, such as blood in stools.    I reviewed her records extensively and collaborated the history with the patient.   MEDICAL HISTORY:  Past Medical History:  Diagnosis Date   Anemia    Headache     SURGICAL HISTORY: Past Surgical History:  Procedure Laterality Date   INDUCED ABORTION     WISDOM TOOTH EXTRACTION      SOCIAL HISTORY: Social History   Socioeconomic History   Marital status: Single    Spouse name: Not on file   Number of children: 0   Years of education: Not on file   Highest education level: Not on file  Occupational History   Not on file  Tobacco Use   Smoking status: Never    Passive exposure: Never   Smokeless tobacco: Never  Vaping Use   Vaping status: Never Used  Substance and Sexual Activity   Alcohol use: Yes    Comment: Occassionally.   Drug use: No   Sexual activity: Yes    Birth control/protection: Inserts  Other Topics Concern   Not on file  Social History Narrative   Not on file   Social Drivers of Health   Financial Resource Strain: Not on file  Food Insecurity: Low Risk  (05/22/2023)    Received from [facility]   Hunger Vital Sign    Within the past 12 months, you worried that your food would run out before you got money to buy more: Never true    Within the past 12 months, the food you bought just didn't last and you didn't have money to get more. : Never true  Transportation Needs: No Transportation Needs (05/22/2023)   Received from [facility]    In the past 12 months, has lack of reliable transportation kept you from medical appointments, meetings, work or from getting things needed for daily living? : No  Physical Activity: Not on file  Stress: Not on file  Social Connections: Not on file  Intimate Partner Violence: Not on file    FAMILY HISTORY: Family History  Problem Relation Age of Onset   Diabetes Maternal Grandmother    Seizures Maternal Grandmother    Asthma Maternal Grandmother    Cancer Maternal Grandfather    Healthy Mother    Healthy Father     ALLERGIES:  has no known allergies.  MEDICATIONS:  Current Outpatient Medications  Medication Sig Dispense Refill   cyclobenzaprine  (FLEXERIL ) 10 MG tablet Take 1 tablet (10 mg total) by mouth 2 (two) times daily as needed for muscle spasms. 10 tablet 0   naproxen  (NAPROSYN ) 375 MG tablet Take 1 tablet (375 mg total) by mouth 2 (two) times daily as needed. 14 tablet 0  valACYclovir  (VALTREX ) 1000 MG tablet Take 1 tablet (1,000 mg total) by mouth daily. 30 tablet 0   No current facility-administered medications for this visit.    REVIEW OF SYSTEMS:   Constitutional: Denies fevers, chills or abnormal night sweats All other systems were reviewed with the patient and are negative.  PHYSICAL EXAMINATION: ECOG PERFORMANCE STATUS: 1 - Symptomatic but completely ambulatory  Vitals:   09/10/24 1554  BP: 104/69  Pulse: 96  Resp: 17  Temp: 98.7 F (37.1 C)  SpO2: 99%   Filed Weights   09/10/24 1554  Weight: 118 lb 12.8 oz (53.9 kg)    GENERAL:alert, no distress and  comfortable  LABORATORY DATA:  I have reviewed the data as listed Lab Results  Component Value Date   WBC 5.5 07/12/2023   HGB 8.4 (L) 07/12/2023   HCT 28.3 (L) 07/12/2023   MCV 76.5 (L) 07/12/2023   PLT 223 07/12/2023   Lab Results  Component Value Date   NA 137 07/12/2023   K 3.5 07/12/2023   CL 110 07/12/2023   CO2 19 (L) 07/12/2023    RADIOGRAPHIC STUDIES: I have personally reviewed the radiological reports and agreed with the findings in the report.  ASSESSMENT AND PLAN:  Iron deficiency anemia due to chronic blood loss Lab review: 07/16/2023: Hemoglobin 9.5, MCV 73.9, RDW 15.5 05/04/2024: Hemoglobin 10.3, MCV 81.1, platelets 213 08/12/2024: Hemoglobin 10.5, MCV 85.3, iron saturation 11%, ferritin 9  Iron deficiency anemia: I discussed with the patient the process of iron absorption. I counseled extensively regarding the different causes of iron deficiency including blood loss and malabsorption.   Recommendation: 1. Stop oral iron 2. proceed with IV iron infusions  I discussed with the patient that potentially there may be a need for additional IV iron infusions if the iron levels were to remain low in the future. The frequency of need of IV iron would depend on many other factors including the rate of loss and by the degree of absorption.  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  Return to clinic in 3 months with recheck on iron studies and hemoglobin.     All questions were answered. The patient knows to call the clinic with any problems, questions or concerns.    Viinay K Jaasia Viglione, MD 09/10/24

## 2024-09-10 NOTE — Assessment & Plan Note (Signed)
 Lab review: 07/16/2023: Hemoglobin 9.5, MCV 73.9, RDW 15.5 05/04/2024: Hemoglobin 10.3, MCV 81.1, platelets 213 08/12/2024: Hemoglobin 10.5, MCV 85.3, iron saturation 11%, ferritin 9  Iron deficiency anemia: I discussed with the patient the process of iron absorption. I counseled extensively regarding the different causes of iron deficiency including blood loss and malabsorption.   Recommendation: 1. Stop oral iron 2. proceed with IV iron infusions  I discussed with the patient that potentially there may be a need for additional IV iron infusions if the iron levels were to remain low in the future. The frequency of need of IV iron would depend on many other factors including the rate of loss and by the degree of absorption.  Return to clinic in 3 months with recheck on iron studies and hemoglobin.

## 2024-09-11 ENCOUNTER — Telehealth: Payer: Self-pay

## 2024-09-11 ENCOUNTER — Other Ambulatory Visit

## 2024-09-11 NOTE — Telephone Encounter (Signed)
 Dr. Gudena, patient will be scheduled as soon as possible.  Auth Submission: NO AUTH NEEDED Site of care: Site of care: [facility] Payer: [employer] medicaid Medication & CPT/J Code(s) submitted: Venofer (Iron Sucrose) J1756 Diagnosis Code:  Route of submission (phone, fax, portal):  Phone # Fax # Auth type: Buy/Bill PB Units/visits requested: 300mg  x 3 doses Reference number:  Approval from: 09/11/24 to 12/16/24

## 2024-09-14 ENCOUNTER — Inpatient Hospital Stay

## 2024-09-14 DIAGNOSIS — D500 Iron deficiency anemia secondary to blood loss (chronic): Secondary | ICD-10-CM | POA: Diagnosis not present

## 2024-09-14 LAB — CBC WITH DIFFERENTIAL (CANCER CENTER ONLY)
Abs Immature Granulocytes: 0 K/uL (ref 0.00–0.07)
Basophils Absolute: 0 K/uL (ref 0.0–0.1)
Basophils Relative: 1 %
Eosinophils Absolute: 0.2 K/uL (ref 0.0–0.5)
Eosinophils Relative: 3 %
HCT: 34.9 % — ABNORMAL LOW (ref 36.0–46.0)
Hemoglobin: 11.4 g/dL — ABNORMAL LOW (ref 12.0–15.0)
Immature Granulocytes: 0 %
Lymphocytes Relative: 55 %
Lymphs Abs: 3 K/uL (ref 0.7–4.0)
MCH: 27.9 pg (ref 26.0–34.0)
MCHC: 32.7 g/dL (ref 30.0–36.0)
MCV: 85.3 fL (ref 80.0–100.0)
Monocytes Absolute: 0.4 K/uL (ref 0.1–1.0)
Monocytes Relative: 7 %
Neutro Abs: 1.8 K/uL (ref 1.7–7.7)
Neutrophils Relative %: 34 %
Platelet Count: 195 K/uL (ref 150–400)
RBC: 4.09 MIL/uL (ref 3.87–5.11)
RDW: 14.4 % (ref 11.5–15.5)
WBC Count: 5.4 K/uL (ref 4.0–10.5)
nRBC: 0 % (ref 0.0–0.2)

## 2024-09-14 LAB — FERRITIN: Ferritin: 13 ng/mL (ref 11–307)

## 2024-09-14 LAB — IRON AND IRON BINDING CAPACITY (CC-WL,HP ONLY)
Iron: 42 ug/dL (ref 28–170)
Saturation Ratios: 10 % — ABNORMAL LOW (ref 10.4–31.8)
TIBC: 444 ug/dL (ref 250–450)
UIBC: 402 ug/dL (ref 148–442)

## 2024-09-22 ENCOUNTER — Telehealth: Payer: Self-pay

## 2024-09-22 NOTE — Telephone Encounter (Signed)
 Called pt to verify information regarding her FMLA form.

## 2024-09-23 ENCOUNTER — Encounter: Admitting: Obstetrics and Gynecology

## 2024-09-29 ENCOUNTER — Telehealth: Payer: Self-pay | Admitting: *Deleted

## 2024-09-29 ENCOUNTER — Ambulatory Visit: Admitting: Obstetrics

## 2024-09-29 ENCOUNTER — Ambulatory Visit (INDEPENDENT_AMBULATORY_CARE_PROVIDER_SITE_OTHER)

## 2024-09-29 ENCOUNTER — Encounter: Payer: Self-pay | Admitting: Obstetrics

## 2024-09-29 VITALS — BP 95/63 | HR 77 | Temp 98.7°F | Resp 20 | Ht 64.0 in | Wt 120.4 lb

## 2024-09-29 VITALS — BP 114/72 | HR 81 | Ht 64.0 in | Wt 120.1 lb

## 2024-09-29 DIAGNOSIS — D500 Iron deficiency anemia secondary to blood loss (chronic): Secondary | ICD-10-CM

## 2024-09-29 DIAGNOSIS — D509 Iron deficiency anemia, unspecified: Secondary | ICD-10-CM

## 2024-09-29 DIAGNOSIS — Z3046 Encounter for surveillance of implantable subdermal contraceptive: Secondary | ICD-10-CM

## 2024-09-29 DIAGNOSIS — N939 Abnormal uterine and vaginal bleeding, unspecified: Secondary | ICD-10-CM

## 2024-09-29 MED ORDER — ACETAMINOPHEN 325 MG PO TABS
650.0000 mg | ORAL_TABLET | Freq: Once | ORAL | Status: AC
  Administered 2024-09-29: 650 mg via ORAL
  Filled 2024-09-29: qty 2

## 2024-09-29 MED ORDER — SODIUM CHLORIDE 0.9 % IV SOLN
300.0000 mg | INTRAVENOUS | Status: DC
  Administered 2024-09-29: 300 mg via INTRAVENOUS
  Filled 2024-09-29: qty 15

## 2024-09-29 MED ORDER — DIPHENHYDRAMINE HCL 25 MG PO CAPS
25.0000 mg | ORAL_CAPSULE | Freq: Once | ORAL | Status: AC
  Administered 2024-09-29: 25 mg via ORAL
  Filled 2024-09-29: qty 1

## 2024-09-29 NOTE — Progress Notes (Signed)
 Pt. presents for heavy periods on nexplanon . Pt received the nexplanon  on 05/04/24

## 2024-09-29 NOTE — Progress Notes (Signed)
 Subjective:    April Wallace is a 28 y.o. female who presents for contraception counseling. The patient has no complaints today. The patient is sexually active. Pertinent past medical history: none.  The information documented in the HPI was reviewed and verified.  Menstrual History: OB History     Gravida  2   Para      Term      Preterm      AB  2   Living  0      SAB  1   IAB      Ectopic      Multiple      Live Births               Patient's last menstrual period was 09/05/2024 (approximate).   Patient Active Problem List   Diagnosis Date Noted   Iron deficiency anemia due to chronic blood loss 09/10/2024   Abnormal menses 10/26/2023   Abnormal uterine bleeding 05/22/2023   Anemia affecting pregnancy in first trimester 11/12/2017   Bacterial vaginitis 10/30/2017   Candida vaginitis 10/30/2017   Abdominal cramping affecting pregnancy 10/30/2017   Morning sickness 10/16/2017   Past Medical History:  Diagnosis Date   Anemia    Headache     Past Surgical History:  Procedure Laterality Date   INDUCED ABORTION     WISDOM TOOTH EXTRACTION       Current Outpatient Medications:    cyclobenzaprine  (FLEXERIL ) 10 MG tablet, Take 1 tablet (10 mg total) by mouth 2 (two) times daily as needed for muscle spasms., Disp: 10 tablet, Rfl: 0   naproxen  (NAPROSYN ) 375 MG tablet, Take 1 tablet (375 mg total) by mouth 2 (two) times daily as needed., Disp: 14 tablet, Rfl: 0   valACYclovir  (VALTREX ) 1000 MG tablet, Take 1 tablet (1,000 mg total) by mouth daily., Disp: 30 tablet, Rfl: 0 No current facility-administered medications for this visit.  Facility-Administered Medications Ordered in Other Visits:    acetaminophen  (TYLENOL ) tablet 650 mg, 650 mg, Oral, Once, Odean Potts, MD   diphenhydrAMINE  (BENADRYL ) capsule 25 mg, 25 mg, Oral, Once, Odean Potts, MD No Known Allergies  Social History   Tobacco Use   Smoking status: Never    Passive exposure:  Never   Smokeless tobacco: Never  Substance Use Topics   Alcohol use: Yes    Comment: Occassionally.    Family History  Problem Relation Age of Onset   Diabetes Maternal Grandmother    Seizures Maternal Grandmother    Asthma Maternal Grandmother    Cancer Maternal Grandfather    Healthy Mother    Healthy Father        Review of Systems Constitutional: negative for weight loss Genitourinary: positive for abnormal menstrual periods.  Negative forvaginal discharge   Objective:   BP 114/72   Pulse 81   Ht 5' 4 (1.626 m)   Wt 120 lb 1.6 oz (54.5 kg)   LMP 09/05/2024 (Approximate)   BMI 20.62 kg/m    General:   Alert and no distress  Skin:   no rash or abnormalities  Lungs:   clear to auscultation bilaterally  Heart:   regular rate and rhythm, S1, S2 normal, no murmur, click, rub or gallop  The remainder of the physical exam deferred due to the type of encounter.   Lab Review Urine pregnancy test Labs reviewed yes Radiologic studies reviewed no  I have spent a total of 20 minutes of face-to-face time, excluding clinical staff time, reviewing  notes and preparing to see patient, ordering tests and/or medications, and counseling the patient.   Assessment:    28 y.o., continuing Nexplanon , no contraindications.   Plan:   1. Encounter for surveillance of implantable subdermal contraceptive (Primary)  2. Abnormal uterine bleeding (AUB) - has had menstrual period-like bleeding since insertion in May 2025 - she is scheduled for an iron infusion ( HGB = 11.4 on 09-14-24 ) - options discussed, and she would like to try OCP suppression of periods for 3     months before removing the Nexplanon  if persistent AUB    All questions answered. Contraception: Nexplanon . Follow up in 3 months.   CARLIN RONAL CENTERS, MD, FACOG Attending Obstetrician & Gynecologist, [facility] for [facility], [facility], [place] 09/29/2024

## 2024-09-29 NOTE — Progress Notes (Signed)
 Diagnosis: Iron Deficiency Anemia  Provider:  Praveen Mannam MD  Procedure: IV Infusion  IV Type: Peripheral, IV Location: R Antecubital  Venofer (Iron Sucrose), Dose: 300 mg  Infusion Start Time: 1353  Infusion Stop Time: 1535  Post Infusion IV Care: Observation period completed and Peripheral IV Discontinued  Discharge: Condition: Good, Destination: Home . AVS Declined  Performed by:  Maximiano JONELLE Pouch, LPN

## 2024-09-29 NOTE — Telephone Encounter (Signed)
 Received call from pt stating she is very afraid of needles and with upcoming IV iron appts she is unsure if she will be able to go through with the infusion due to having a IV needle inserted.  Pt requesting PRN anxiety medication to take prior to IV infusions.  RN will review with MD.

## 2024-09-30 ENCOUNTER — Telehealth: Payer: Self-pay | Admitting: *Deleted

## 2024-09-30 ENCOUNTER — Telehealth: Payer: Self-pay

## 2024-09-30 ENCOUNTER — Other Ambulatory Visit: Payer: Self-pay | Admitting: Hematology and Oncology

## 2024-09-30 MED ORDER — ALPRAZOLAM 0.25 MG PO TABS: 0.2500 mg | ORAL_TABLET | Freq: Every evening | ORAL | 0 refills | Status: AC | PRN

## 2024-09-30 NOTE — Telephone Encounter (Signed)
 Pt called to check the status of her paper work and wanted to know if she needed to get an extension. I told if she could to be on the safe side, but if not to call me back and let me know. Pt verbalized understanding.

## 2024-09-30 NOTE — Telephone Encounter (Signed)
 MD placed order for pt to take Xanax 0.25 mg tablet prior to IV iron to help with fear of needles. RN contacted pt and educated pt to take prescription 30 min to 1 hour prior to IV iron infusion and driver will be needed.  Pt verbalized understanding.

## 2024-10-07 ENCOUNTER — Telehealth: Payer: Self-pay | Admitting: *Deleted

## 2024-10-07 NOTE — Telephone Encounter (Signed)
 Completed [employer] work accommodation paperwork   Sent to provider to review, amend, sign and return to this nurse to return to claims Armed forces technical officer.

## 2024-10-08 NOTE — Telephone Encounter (Signed)
 [employer] form returned today..  Successfully faxed to [phone].  E-mailed to [email or web address].  Mailed to address on file.  [address] April Wallace [place] [address] Copy to [facility] HIM bin for items to be scanned completes process.  No furthe instructions received, actions performed or required by this  nurse.,

## 2024-10-09 ENCOUNTER — Ambulatory Visit

## 2024-10-12 ENCOUNTER — Telehealth: Admitting: Family Medicine

## 2024-10-12 ENCOUNTER — Telehealth: Payer: Self-pay

## 2024-10-12 DIAGNOSIS — N760 Acute vaginitis: Secondary | ICD-10-CM

## 2024-10-12 NOTE — Progress Notes (Signed)
 Because you were treated virtual by another group last month, you will need in person testing this time since it has returned. Your condition warrants further evaluation and recommend that you be seen in a face-to-face visit at your PCP office or local urgent care.   NOTE: There will be NO CHARGE for this E-Visit   If you are having a true medical emergency, please call 911.

## 2024-10-12 NOTE — Telephone Encounter (Signed)
 Pt called in for a copy of her FMLA forms. I emailed her copy. No questions or concerns to be noted.

## 2024-10-16 ENCOUNTER — Ambulatory Visit

## 2024-10-23 ENCOUNTER — Ambulatory Visit

## 2024-11-02 ENCOUNTER — Telehealth: Payer: Self-pay | Admitting: *Deleted

## 2024-11-02 NOTE — Telephone Encounter (Signed)
 10-29-2024 completed [employer] LOA for [name].  Form to provider for review, amending, signature and return to nurse.   10-30-2024 Received paperwork signed by provider.  Did not receive page one with barcode from patient.  Faxed for, to [phone]. 11-02-2024 [name] called requesting form be emailed to her.  Emailed to [email or web address].  Received signed ROI through [employer].  Paperwork to bin for items to be scanned.  No further instructions received, actions performed or required by this nurse,

## 2024-11-06 ENCOUNTER — Ambulatory Visit

## 2024-11-19 ENCOUNTER — Telehealth: Payer: Self-pay

## 2024-11-19 NOTE — Telephone Encounter (Signed)
 LVM for pt to return my phone call. Left call back number

## 2024-11-19 NOTE — Telephone Encounter (Signed)
 Pt called into the office requesting her form to be updated per employer. Minor corrections was made. Pt verbalized understanding. Pt copy was emailed upon request.

## 2024-11-20 ENCOUNTER — Ambulatory Visit

## 2024-11-27 ENCOUNTER — Ambulatory Visit

## 2024-11-27 VITALS — BP 108/75 | HR 86 | Temp 97.5°F | Resp 18 | Ht 64.0 in | Wt 123.8 lb

## 2024-11-27 DIAGNOSIS — D500 Iron deficiency anemia secondary to blood loss (chronic): Secondary | ICD-10-CM | POA: Diagnosis not present

## 2024-11-27 MED ORDER — DIPHENHYDRAMINE HCL 50 MG/ML IJ SOLN: 50.0000 mg | Freq: Once | INTRAMUSCULAR | Status: DC | PRN

## 2024-11-27 MED ORDER — ACETAMINOPHEN 325 MG PO TABS
650.0000 mg | ORAL_TABLET | Freq: Once | ORAL | Status: AC
  Administered 2024-11-27: 650 mg via ORAL
  Filled 2024-11-27: qty 2

## 2024-11-27 MED ORDER — FAMOTIDINE IN NACL 20-0.9 MG/50ML-% IV SOLN: 20.0000 mg | Freq: Once | INTRAVENOUS | Status: DC | PRN

## 2024-11-27 MED ORDER — SODIUM CHLORIDE 0.9 % IV SOLN: Freq: Once | INTRAVENOUS | Status: AC | PRN

## 2024-11-27 MED ORDER — EPINEPHRINE 0.3 MG/0.3ML IJ SOAJ: 0.3000 mg | Freq: Once | INTRAMUSCULAR | Status: DC | PRN

## 2024-11-27 MED ORDER — ALBUTEROL SULFATE HFA 108 (90 BASE) MCG/ACT IN AERS: 2.0000 | INHALATION_SPRAY | Freq: Once | RESPIRATORY_TRACT | Status: DC | PRN

## 2024-11-27 MED ORDER — METHYLPREDNISOLONE SODIUM SUCC 125 MG IJ SOLR
125.0000 mg | Freq: Once | INTRAMUSCULAR | Status: AC | PRN
  Administered 2024-11-27: 125 mg via INTRAVENOUS

## 2024-11-27 MED ORDER — DIPHENHYDRAMINE HCL 25 MG PO CAPS
25.0000 mg | ORAL_CAPSULE | Freq: Once | ORAL | Status: AC
  Administered 2024-11-27: 25 mg via ORAL
  Filled 2024-11-27: qty 1

## 2024-11-27 MED ORDER — IRON SUCROSE 300 MG IVPB - SIMPLE MED
300.0000 mg | Status: DC
  Administered 2024-11-27: 300 mg via INTRAVENOUS
  Filled 2024-11-27: qty 265

## 2024-11-27 NOTE — Progress Notes (Signed)
 Diagnosis: , Iron  Deficiency Anemia  Provider:  Praveen Mannam MD  Procedure: IV Infusion  IV Type: Peripheral, IV Location: R Antecubital  , Venofer  (Iron  Sucrose), Dose: 300 mg  Infusion Start Time: 0903  Infusion Stop Time: 1045  1110-Patient  completed Venofer  300mg  infusion. During post-observation, and began complaining of severe back pain. Denies any  other  symptoms. Normal Saline, Dose: 1000 ml and solumedrol 125 mg IV given per protocol. V/S within normal range. Dr Mackey Chad notified. Advised to add Solumedrol as pre medication for next infusion. Pharmacy notified. 1214 pt reported her pain went down and able to walk and went to bathroom.VS obtained and IV site discontinued.Pt advised to call MD or go to ER if the symptoms reoccurred or doesn't get better. Pt verbalized understanding.  Infusion Start Time: 1111  Infusion Stop Time: 1203  Post Infusion IV Care: Observation period completed and Peripheral IV Discontinued  Discharge: Condition: Good, Destination: Home . AVS Declined  Performed by:  Janisha Bueso, RN

## 2024-12-04 ENCOUNTER — Ambulatory Visit

## 2024-12-04 VITALS — BP 94/61 | HR 95 | Temp 98.9°F | Resp 16 | Ht 64.0 in | Wt 119.4 lb

## 2024-12-04 DIAGNOSIS — D500 Iron deficiency anemia secondary to blood loss (chronic): Secondary | ICD-10-CM

## 2024-12-04 MED ORDER — ACETAMINOPHEN 325 MG PO TABS
650.0000 mg | ORAL_TABLET | Freq: Once | ORAL | Status: AC
  Administered 2024-12-04: 650 mg via ORAL
  Filled 2024-12-04: qty 2

## 2024-12-04 MED ORDER — IRON SUCROSE 300 MG IVPB - SIMPLE MED
300.0000 mg | Status: DC
  Administered 2024-12-04: 300 mg via INTRAVENOUS
  Filled 2024-12-04: qty 265

## 2024-12-04 MED ORDER — DIPHENHYDRAMINE HCL 25 MG PO CAPS
25.0000 mg | ORAL_CAPSULE | Freq: Once | ORAL | Status: AC
  Administered 2024-12-04: 25 mg via ORAL
  Filled 2024-12-04: qty 1

## 2024-12-04 MED ORDER — METHYLPREDNISOLONE SODIUM SUCC 125 MG IJ SOLR
125.0000 mg | Freq: Once | INTRAMUSCULAR | Status: AC
  Administered 2024-12-04: 125 mg via INTRAVENOUS
  Filled 2024-12-04: qty 2

## 2024-12-04 NOTE — Progress Notes (Signed)
 Diagnosis: Iron  Deficiency Anemia  Provider:  Praveen Mannam MD  Procedure: IV Infusion  IV Type: Peripheral, IV Location: R Antecubital  Venofer  (Iron  Sucrose), Dose: 300 mg  Infusion Start Time: 1243  Infusion Stop Time: 1423  Post Infusion IV Care: Observation period completed and Peripheral IV Discontinued  Discharge: Condition: Good, Destination: Home . AVS Declined  Performed by:  Chayna Surratt, RN

## 2024-12-14 ENCOUNTER — Other Ambulatory Visit

## 2024-12-16 ENCOUNTER — Telehealth: Admitting: Hematology and Oncology

## 2024-12-28 ENCOUNTER — Other Ambulatory Visit: Payer: Self-pay

## 2024-12-28 DIAGNOSIS — D500 Iron deficiency anemia secondary to blood loss (chronic): Secondary | ICD-10-CM

## 2024-12-29 ENCOUNTER — Inpatient Hospital Stay: Attending: Hematology and Oncology

## 2024-12-29 ENCOUNTER — Encounter: Payer: Self-pay | Admitting: Obstetrics & Gynecology

## 2024-12-29 ENCOUNTER — Other Ambulatory Visit (HOSPITAL_COMMUNITY)
Admission: RE | Admit: 2024-12-29 | Discharge: 2024-12-29 | Disposition: A | Source: Ambulatory Visit | Attending: Obstetrics & Gynecology | Admitting: Obstetrics & Gynecology

## 2024-12-29 ENCOUNTER — Ambulatory Visit: Payer: Self-pay | Admitting: Obstetrics & Gynecology

## 2024-12-29 VITALS — BP 109/74 | HR 91 | Ht 64.0 in | Wt 124.0 lb

## 2024-12-29 DIAGNOSIS — F419 Anxiety disorder, unspecified: Secondary | ICD-10-CM

## 2024-12-29 DIAGNOSIS — Z975 Presence of (intrauterine) contraceptive device: Secondary | ICD-10-CM

## 2024-12-29 DIAGNOSIS — R87612 Low grade squamous intraepithelial lesion on cytologic smear of cervix (LGSIL): Secondary | ICD-10-CM

## 2024-12-29 DIAGNOSIS — D500 Iron deficiency anemia secondary to blood loss (chronic): Secondary | ICD-10-CM

## 2024-12-29 LAB — IRON AND IRON BINDING CAPACITY (CC-WL,HP ONLY)
Iron: 95 ug/dL (ref 28–170)
Saturation Ratios: 30 % (ref 10.4–31.8)
TIBC: 321 ug/dL (ref 250–450)
UIBC: 226 ug/dL

## 2024-12-29 LAB — CBC WITH DIFFERENTIAL (CANCER CENTER ONLY)
Abs Immature Granulocytes: 0.01 K/uL (ref 0.00–0.07)
Basophils Absolute: 0 K/uL (ref 0.0–0.1)
Basophils Relative: 0 %
Eosinophils Absolute: 0.2 K/uL (ref 0.0–0.5)
Eosinophils Relative: 3 %
HCT: 35.6 % — ABNORMAL LOW (ref 36.0–46.0)
Hemoglobin: 12 g/dL (ref 12.0–15.0)
Immature Granulocytes: 0 %
Lymphocytes Relative: 43 %
Lymphs Abs: 2.2 K/uL (ref 0.7–4.0)
MCH: 30.3 pg (ref 26.0–34.0)
MCHC: 33.7 g/dL (ref 30.0–36.0)
MCV: 89.9 fL (ref 80.0–100.0)
Monocytes Absolute: 0.4 K/uL (ref 0.1–1.0)
Monocytes Relative: 8 %
Neutro Abs: 2.3 K/uL (ref 1.7–7.7)
Neutrophils Relative %: 46 %
Platelet Count: 184 K/uL (ref 150–400)
RBC: 3.96 MIL/uL (ref 3.87–5.11)
RDW: 13.1 % (ref 11.5–15.5)
WBC Count: 5.1 K/uL (ref 4.0–10.5)
nRBC: 0 % (ref 0.0–0.2)

## 2024-12-29 LAB — FERRITIN: Ferritin: 376 ng/mL — ABNORMAL HIGH (ref 11–307)

## 2024-12-29 MED ORDER — LORAZEPAM 1 MG PO TABS: 1.0000 mg | ORAL_TABLET | Freq: Three times a day (TID) | ORAL | 0 refills | Status: DC

## 2024-12-29 NOTE — Progress Notes (Signed)
 GYNECOLOGY OFFICE PROCEDURE NOTE  April Wallace is a 28 y.o. [id number] here for [name]    No other gynecologic concerns. Needs f/u pap for CIN1  Pelvic exam: normal external genitalia, vulva, vagina, cervix, uterus and adnexa, VULVA: normal appearing vulva with no masses, tenderness or lesions, VAGINA: normal appearing vagina with normal color and discharge, no lesions, CERVIX: normal appearing cervix without discharge or lesions.  [name] Patient identified, informed consent performed, consent signed.   Appropriate time out taken. Nexplanon  site identified.  Area prepped in usual sterile fashon. 3 ml of 1% lidocaine  was used to anesthetize the area at the distal end of the implant. A small stab incision was attempted but patient was tearful and very anxious and said the felt the blade and only a small ! Mm cut was made. She requested she be scheduled in a surgery center, was offered med as anxiolytic but does not want to try in the office. Orders Placed This Encounter  Procedures   Ambulatory Referral For Surgery Scheduling    Referral Priority:   Routine    Referral Type:   Consultation    Number of Visits Requested:   1     [name], MD Attending Obstetrician & Gynecologist, [facility] for [facility]  12/29/2024

## 2024-12-30 ENCOUNTER — Ambulatory Visit: Payer: Self-pay | Admitting: Obstetrics & Gynecology

## 2024-12-31 ENCOUNTER — Telehealth: Payer: Self-pay

## 2024-12-31 ENCOUNTER — Inpatient Hospital Stay (HOSPITAL_BASED_OUTPATIENT_CLINIC_OR_DEPARTMENT_OTHER): Admitting: Hematology and Oncology

## 2024-12-31 DIAGNOSIS — D500 Iron deficiency anemia secondary to blood loss (chronic): Secondary | ICD-10-CM

## 2024-12-31 NOTE — Assessment & Plan Note (Signed)
 Lab review: 07/16/2023: Hemoglobin 9.5, MCV 73.9, RDW 15.5 05/04/2024: Hemoglobin 10.3, MCV 81.1, platelets 213 08/12/2024: Hemoglobin 10.5, MCV 85.3, iron  saturation 11%, ferritin 9 12/29/2024: Hemoglobin 12, MCV 89.9, iron  saturation 30%, ferritin 376   Iron  deficiency anemia: No further issues with iron  deficiency   Recommendation: Recheck labs in 3 months and telephone visit after that

## 2024-12-31 NOTE — Telephone Encounter (Signed)
 Patient called her doctor's office in reference to being scheduled for a procedure. I called patient to advise Dr. Eveline has available OR time at 1000 on 01/05/25 at [facility]. I left a voicemail requesting patient call me back asap to secure date and time.

## 2024-12-31 NOTE — Progress Notes (Signed)
 HEMATOLOGY-ONCOLOGY TELEPHONE VISIT PROGRESS NOTE  I connected with our patient on 12/31/24 at  9:30 AM EST by telephone and verified that I am speaking with the correct person using two identifiers.  I discussed the limitations, risks, security and privacy concerns of performing an evaluation and management service by telephone and the availability of in person appointments.  I also discussed with the patient that there may be a patient responsible charge related to this service. The patient expressed understanding and agreed to proceed.   History of Present Illness: Follow-up of iron  deficiency anemia  History of Present Illness April Wallace is a 29 year old female with iron  deficiency anemia due to chronic blood loss who presents for hematology follow-up after iron  infusion.  She received an IV iron  infusion in December 2025 after inadequate response to oral iron . Post-infusion labs show hemoglobin 12 g/dL and ferritin increased from 9 to 376 ng/mL. She denies fatigue, weakness, or other anemia symptoms and feels well.   REVIEW OF SYSTEMS:   Constitutional: Denies fevers, chills or abnormal weight loss All other systems were reviewed with the patient and are negative. Observations/Objective:     Assessment Plan:  Iron  deficiency anemia due to chronic blood loss Lab review: 07/16/2023: Hemoglobin 9.5, MCV 73.9, RDW 15.5 05/04/2024: Hemoglobin 10.3, MCV 81.1, platelets 213 08/12/2024: Hemoglobin 10.5, MCV 85.3, iron  saturation 11%, ferritin 9 12/29/2024: Hemoglobin 12, MCV 89.9, iron  saturation 30%, ferritin 376   Iron  deficiency anemia: No further issues with iron  deficiency   Recommendation: Recheck labs in 6 months and telephone visit after that    I discussed the assessment and treatment plan with the patient. The patient was provided an opportunity to ask questions and all were answered. The patient agreed with the plan and demonstrated an understanding of the instructions.  The patient was advised to call back or seek an in-person evaluation if the symptoms worsen or if the condition fails to improve as anticipated.   I provided 20 minutes of non-face-to-face time during this encounter.  This includes time for charting and coordination of care   April MARLA Chad, MD

## 2024-12-31 NOTE — Telephone Encounter (Signed)
 Patient called back to confirm that she would like her surgery scheduled w/ Dr. Eveline on 01/05/25. Pre-Op instructions were provided by phone.

## 2025-01-01 ENCOUNTER — Ambulatory Visit: Payer: Self-pay | Admitting: Obstetrics & Gynecology

## 2025-01-03 ENCOUNTER — Other Ambulatory Visit: Payer: Self-pay | Admitting: Obstetrics & Gynecology

## 2025-01-04 ENCOUNTER — Encounter (HOSPITAL_COMMUNITY): Payer: Self-pay | Admitting: Obstetrics & Gynecology

## 2025-01-04 ENCOUNTER — Other Ambulatory Visit: Payer: Self-pay

## 2025-01-05 ENCOUNTER — Encounter (HOSPITAL_COMMUNITY)
Admission: RE | Disposition: A | Discharge status: Home / Self Care | Payer: Self-pay | Source: Home / Self Care | Attending: Obstetrics & Gynecology

## 2025-01-05 ENCOUNTER — Ambulatory Visit (HOSPITAL_COMMUNITY)
Admission: RE | Admit: 2025-01-05 | Discharge: 2025-01-05 | Disposition: A | Discharge status: Home / Self Care | Attending: Obstetrics & Gynecology | Admitting: Obstetrics & Gynecology

## 2025-01-05 ENCOUNTER — Encounter (HOSPITAL_COMMUNITY): Payer: Self-pay | Admitting: Obstetrics & Gynecology

## 2025-01-05 ENCOUNTER — Ambulatory Visit (HOSPITAL_COMMUNITY): Admitting: Anesthesiology

## 2025-01-05 ENCOUNTER — Other Ambulatory Visit: Payer: Self-pay

## 2025-01-05 DIAGNOSIS — F419 Anxiety disorder, unspecified: Secondary | ICD-10-CM | POA: Diagnosis not present

## 2025-01-05 DIAGNOSIS — Z3046 Encounter for surveillance of implantable subdermal contraceptive: Secondary | ICD-10-CM

## 2025-01-05 DIAGNOSIS — Z01818 Encounter for other preprocedural examination: Secondary | ICD-10-CM

## 2025-01-05 HISTORY — PX: REMOVAL OF DRUG DELIVERY IMPLANT: SHX6585

## 2025-01-05 LAB — POCT PREGNANCY, URINE: Preg Test, Ur: NEGATIVE

## 2025-01-05 MED ORDER — OXYCODONE HCL 5 MG PO TABS: 5.0000 mg | ORAL_TABLET | Freq: Once | ORAL | Status: DC | PRN

## 2025-01-05 MED ORDER — LACTATED RINGERS IV SOLN: INTRAVENOUS | Status: DC

## 2025-01-05 MED ORDER — LIDOCAINE 2% (20 MG/ML) 5 ML SYRINGE
INTRAMUSCULAR | Status: AC
  Filled 2025-01-05: qty 5

## 2025-01-05 MED ORDER — LIDOCAINE 2% (20 MG/ML) 5 ML SYRINGE
INTRAMUSCULAR | Status: DC | PRN
  Administered 2025-01-05: 40 mg via INTRAVENOUS
  Administered 2025-01-05: 20 mg via INTRAVENOUS

## 2025-01-05 MED ORDER — LIDOCAINE HCL (PF) 1 % IJ SOLN
INTRAMUSCULAR | Status: DC | PRN
  Administered 2025-01-05: 3 mL via SUBCUTANEOUS

## 2025-01-05 MED ORDER — OXYCODONE HCL 5 MG/5ML PO SOLN: 5.0000 mg | Freq: Once | ORAL | Status: DC | PRN

## 2025-01-05 MED ORDER — ONDANSETRON HCL 4 MG/2ML IJ SOLN
INTRAMUSCULAR | Status: DC | PRN
  Administered 2025-01-05: 4 mg via INTRAVENOUS

## 2025-01-05 MED ORDER — PROPOFOL 10 MG/ML IV BOLUS
INTRAVENOUS | Status: DC | PRN
  Administered 2025-01-05: 20 mg via INTRAVENOUS
  Administered 2025-01-05: 100 mg via INTRAVENOUS

## 2025-01-05 MED ORDER — LIDOCAINE HCL (PF) 1 % IJ SOLN
INTRAMUSCULAR | Status: AC
  Filled 2025-01-05: qty 5

## 2025-01-05 MED ORDER — HYDROMORPHONE HCL 1 MG/ML IJ SOLN: 0.2500 mg | INTRAMUSCULAR | Status: DC | PRN

## 2025-01-05 MED ORDER — ONDANSETRON HCL 4 MG/2ML IJ SOLN
INTRAMUSCULAR | Status: AC
  Filled 2025-01-05: qty 2

## 2025-01-05 MED ORDER — CHLORHEXIDINE GLUCONATE 0.12 % MT SOLN
OROMUCOSAL | Status: AC
  Administered 2025-01-05: 15 mL via OROMUCOSAL
  Filled 2025-01-05: qty 15

## 2025-01-05 MED ORDER — CHLORHEXIDINE GLUCONATE 0.12 % MT SOLN: 15.0000 mL | Freq: Once | OROMUCOSAL | Status: AC

## 2025-01-05 MED ORDER — ORAL CARE MOUTH RINSE: 15.0000 mL | Freq: Once | OROMUCOSAL | Status: AC

## 2025-01-05 MED ORDER — AMISULPRIDE (ANTIEMETIC) 5 MG/2ML IV SOLN: 10.0000 mg | Freq: Once | INTRAVENOUS | Status: DC | PRN

## 2025-01-05 MED ORDER — PROPOFOL 10 MG/ML IV BOLUS
INTRAVENOUS | Status: AC
  Filled 2025-01-05: qty 20

## 2025-01-05 NOTE — H&P (Signed)
 April Wallace is an 29 y.o. female. [id number] No LMP recorded (lmp unknown). (Menstrual status: Irregular Periods). She comes for removal of Nexplanon  with sedation due to anxiety.  Pertinent Gynecological History:  Last pap: normal Date: 12/29/24  Menstrual History:  No LMP recorded (lmp unknown). (Menstrual status: Irregular Periods).    Past Medical History:  Diagnosis Date   Anemia    Headache     Past Surgical History:  Procedure Laterality Date   INDUCED ABORTION     WISDOM TOOTH EXTRACTION      Family History  Problem Relation Age of Onset   Diabetes Maternal Grandmother    Seizures Maternal Grandmother    Asthma Maternal Grandmother    Cancer Maternal Grandfather    Healthy Mother    Healthy Father     Social History:  reports that she has never smoked. She has never been exposed to tobacco smoke. She has never used smokeless tobacco. She reports current alcohol use. She reports that she does not use drugs.  Allergies: Allergies[1]  Medications Prior to Admission  Medication Sig Dispense Refill Last Dose/Taking   valACYclovir  (VALTREX ) 500 MG tablet Take 500 mg by mouth daily.   01/04/2025 Morning   ALPRAZolam  (XANAX ) 0.25 MG tablet Take 1 tablet (0.25 mg total) by mouth at bedtime as needed for anxiety (To take along with iron  infusions). 6 tablet 0    fluconazole  (DIFLUCAN ) 150 MG tablet Take 150 mg by mouth as directed.      LORazepam  (ATIVAN ) 1 MG tablet Take 1 tablet (1 mg total) by mouth every 8 (eight) hours. 2 tablet 0     Review of Systems  Constitutional: Negative.   Respiratory: Negative.    Cardiovascular: Negative.   Gastrointestinal: Negative.     Height 5' 4 (1.626 m), weight 54.9 kg. Physical Exam Vitals and nursing note reviewed. Exam conducted with a chaperone present.  Constitutional:      Appearance: Normal appearance. She is not ill-appearing.  HENT:     Head: Normocephalic and atraumatic.  Cardiovascular:     Rate and  Rhythm: Normal rate.  Pulmonary:     Effort: Pulmonary effort is normal.  Abdominal:     General: Abdomen is flat.  Neurological:     Mental Status: She is alert.  Psychiatric:        Mood and Affect: Mood normal.        Behavior: Behavior normal.     No results found for this or any previous visit (from the past 24 hours).  No results found.  Assessment/Plan: [name] requested. Procedure and risks discussed and consent signed, questions answered  Lynwood Solomons 01/05/2025, 10:35 AM      [1] No Known Allergies

## 2025-01-05 NOTE — Op Note (Signed)
 April Wallace PROCEDURE DATE: 01/05/2025  PREOPERATIVE DIAGNOSIS: [name] under sedation POSTOPERATIVE DIAGNOSIS: The same PROCEDURE:     [name] SURGEON:  Lynwood Solomons MD  ANESTHESIA:    Monitored intravenous sedation, paracervical block. INTRAVENOUS FLUIDS:  500 ml of LR ESTIMATED BLOOD LOSS:  Less than 5 ml. SPECIMENS:  none COMPLICATIONS:  None immediate.   [name] Patient identified, informed consent performed, consent signed. Monitored sedation was given.  Appropriate time out taken. Nexplanon  site identified.  Area prepped in usual sterile fashon. 3 ml of 1% lidocaine  was used to anesthetize the area at the distal end of the implant. A small stab incision was made right beside the implant on the distal portion.  The Nexplanon  rod was grasped using hemostat and removed without difficulty.  There was minimal blood loss. There were no complications.   Steri-strips were applied over the small incision.  A pressure bandage was applied to reduce any bruising.  The patient tolerated the procedure well and was given post procedure instructions.    Solomons Lynwood MATSU, MD Attending Obstetrician & Gynecologist, [facility]  01/05/2025  11:11 AM

## 2025-01-05 NOTE — Anesthesia Postprocedure Evaluation (Signed)
"   Anesthesia Post Note  Patient: April Wallace  Procedure(s) Performed: REMOVAL, DRUG DELIVERY IMPLANT (Left: Arm Upper)     Patient location during evaluation: PACU Anesthesia Type: MAC Level of consciousness: awake and alert Pain management: pain level controlled Vital Signs Assessment: post-procedure vital signs reviewed and stable Respiratory status: spontaneous breathing, nonlabored ventilation and respiratory function stable Cardiovascular status: blood pressure returned to baseline and stable Postop Assessment: no apparent nausea or vomiting Anesthetic complications: no   No notable events documented.  Last Vitals:  Vitals:   01/05/25 1115 01/05/25 1130  BP: (!) 85/62 97/61  Pulse: 62 64  Resp: (!) 26 11  Temp:  36.6 C  SpO2: 99% 98%    Last Pain:  Vitals:   01/05/25 1130  TempSrc:   PainSc: 0-No pain                 Butler Levander Pinal      "

## 2025-01-05 NOTE — Transfer of Care (Signed)
 Immediate Anesthesia Transfer of Care Note  Patient: April Wallace  Procedure(s) Performed: REMOVAL, DRUG DELIVERY IMPLANT (Left: Arm Upper)  Patient Location: PACU  Anesthesia Type:MAC  Level of Consciousness: awake, drowsy, and patient cooperative  Airway & Oxygen Therapy: Patient Spontanous Breathing  Post-op Assessment: Report given to RN, Post -op Vital signs reviewed and stable, and Patient moving all extremities X 4  Post vital signs: Reviewed and stable  Last Vitals:  Vitals Value Taken Time  BP 86/59 01/05/25 11:08  Temp 36.6 C 01/05/25 11:06  Pulse 71 01/05/25 11:12  Resp 20 01/05/25 11:12  SpO2 98 % 01/05/25 11:12  Vitals shown include unfiled device data.  Last Pain:  Vitals:   01/05/25 1106  TempSrc:   PainSc: 0-No pain      Patients Stated Pain Goal: 5 (01/05/25 0936)  Complications: No notable events documented.

## 2025-01-05 NOTE — Anesthesia Preprocedure Evaluation (Signed)
"                                    Anesthesia Evaluation  Patient identified by MRN, date of birth, ID band Patient awake    Reviewed: Allergy & Precautions, H&P , NPO status , Patient's Chart, lab work & pertinent test results  Airway Mallampati: II  TM Distance: >3 FB Neck ROM: Full    Dental  (+) Dental Advisory Given   Pulmonary neg pulmonary ROS   Pulmonary exam normal breath sounds clear to auscultation       Cardiovascular negative cardio ROS Normal cardiovascular exam Rhythm:Regular Rate:Normal     Neuro/Psych  Headaches  negative psych ROS   GI/Hepatic negative GI ROS, Neg liver ROS,,,  Endo/Other  negative endocrine ROS    Renal/GU negative Renal ROS  negative genitourinary   Musculoskeletal negative musculoskeletal ROS (+)    Abdominal   Peds negative pediatric ROS (+)  Hematology negative hematology ROS (+)   Anesthesia Other Findings   Reproductive/Obstetrics negative OB ROS                              Anesthesia Physical Anesthesia Plan  ASA: 2  Anesthesia Plan: MAC   Post-op Pain Management: Minimal or no pain anticipated   Induction: Intravenous  PONV Risk Score and Plan: 2 and Treatment may vary due to age or medical condition  Airway Management Planned: Simple Face Mask  Additional Equipment:   Intra-op Plan:   Post-operative Plan:   Informed Consent: I have reviewed the patients History and Physical, chart, labs and discussed the procedure including the risks, benefits and alternatives for the proposed anesthesia with the patient or authorized representative who has indicated his/her understanding and acceptance.     Dental advisory given  Plan Discussed with: CRNA  Anesthesia Plan Comments:         Anesthesia Quick Evaluation  "

## 2025-01-06 ENCOUNTER — Encounter (HOSPITAL_COMMUNITY): Payer: Self-pay | Admitting: Obstetrics & Gynecology

## 2025-01-08 LAB — CYTOLOGY - PAP
Adequacy: ABSENT
Comment: NEGATIVE
Diagnosis: NEGATIVE
High risk HPV: NEGATIVE

## 2025-04-07 ENCOUNTER — Ambulatory Visit: Admitting: Dermatology

## 2025-06-30 ENCOUNTER — Inpatient Hospital Stay

## 2025-07-05 ENCOUNTER — Inpatient Hospital Stay: Admitting: Hematology and Oncology
